# Patient Record
Sex: Female | Born: 1935 | Race: White | Hispanic: No | Marital: Married | Smoking: Former smoker
Health system: Southern US, Community
[De-identification: ages and names within clinical notes are randomized; demographics above are authoritative.]

## PROBLEM LIST (undated history)

## (undated) DIAGNOSIS — I340 Nonrheumatic mitral (valve) insufficiency: Secondary | ICD-10-CM

## (undated) DIAGNOSIS — N390 Urinary tract infection, site not specified: Secondary | ICD-10-CM

## (undated) DIAGNOSIS — Z9861 Coronary angioplasty status: Secondary | ICD-10-CM

## (undated) DIAGNOSIS — I700 Atherosclerosis of aorta: Secondary | ICD-10-CM

## (undated) DIAGNOSIS — I38 Endocarditis, valve unspecified: Secondary | ICD-10-CM

## (undated) DIAGNOSIS — K5792 Diverticulitis of intestine, part unspecified, without perforation or abscess without bleeding: Secondary | ICD-10-CM

## (undated) DIAGNOSIS — I480 Paroxysmal atrial fibrillation: Secondary | ICD-10-CM

## (undated) DIAGNOSIS — R001 Bradycardia, unspecified: Secondary | ICD-10-CM

## (undated) DIAGNOSIS — Q211 Atrial septal defect: Secondary | ICD-10-CM

## (undated) DIAGNOSIS — I10 Essential (primary) hypertension: Secondary | ICD-10-CM

## (undated) DIAGNOSIS — F419 Anxiety disorder, unspecified: Secondary | ICD-10-CM

## (undated) DIAGNOSIS — E785 Hyperlipidemia, unspecified: Secondary | ICD-10-CM

## (undated) DIAGNOSIS — Q2112 Patent foramen ovale: Secondary | ICD-10-CM

## (undated) DIAGNOSIS — R55 Syncope and collapse: Secondary | ICD-10-CM

## (undated) DIAGNOSIS — Z8719 Personal history of other diseases of the digestive system: Secondary | ICD-10-CM

## (undated) DIAGNOSIS — I2510 Atherosclerotic heart disease of native coronary artery without angina pectoris: Secondary | ICD-10-CM

## (undated) DIAGNOSIS — E039 Hypothyroidism, unspecified: Secondary | ICD-10-CM

## (undated) DIAGNOSIS — Z8542 Personal history of malignant neoplasm of other parts of uterus: Secondary | ICD-10-CM

## (undated) DIAGNOSIS — I495 Sick sinus syndrome: Secondary | ICD-10-CM

## (undated) DIAGNOSIS — H814 Vertigo of central origin: Secondary | ICD-10-CM

## (undated) DIAGNOSIS — I5032 Chronic diastolic (congestive) heart failure: Secondary | ICD-10-CM

## (undated) HISTORY — DX: Patent foramen ovale: Q21.12

## (undated) HISTORY — DX: Personal history of other diseases of the digestive system: Z87.19

## (undated) HISTORY — DX: Urinary tract infection, site not specified: N39.0

## (undated) HISTORY — DX: Atrial septal defect: Q21.1

## (undated) HISTORY — DX: Personal history of malignant neoplasm of other parts of uterus: Z85.42

## (undated) HISTORY — DX: Paroxysmal atrial fibrillation: I48.0

## (undated) HISTORY — PX: OTHER SURGICAL HISTORY: SHX169

## (undated) HISTORY — DX: Nonrheumatic mitral (valve) insufficiency: I34.0

## (undated) HISTORY — DX: Vertigo of central origin: H81.4

## (undated) HISTORY — DX: Anxiety disorder, unspecified: F41.9

## (undated) HISTORY — DX: Endocarditis, valve unspecified: I38

## (undated) HISTORY — DX: Coronary angioplasty status: Z98.61

## (undated) HISTORY — DX: Sick sinus syndrome: I49.5

## (undated) HISTORY — DX: Hyperlipidemia, unspecified: E78.5

## (undated) HISTORY — DX: Chronic diastolic (congestive) heart failure: I50.32

## (undated) HISTORY — DX: Bradycardia, unspecified: R00.1

## (undated) HISTORY — DX: Hypothyroidism, unspecified: E03.9

## (undated) HISTORY — PX: TONSILLECTOMY: SUR1361

## (undated) HISTORY — DX: Essential (primary) hypertension: I10

## (undated) HISTORY — DX: Syncope and collapse: R55

## (undated) HISTORY — DX: Atherosclerosis of aorta: I70.0

## (undated) HISTORY — DX: Atherosclerotic heart disease of native coronary artery without angina pectoris: I25.10

## (undated) HISTORY — DX: Diverticulitis of intestine, part unspecified, without perforation or abscess without bleeding: K57.92

---

## 1999-09-19 DIAGNOSIS — Z8542 Personal history of malignant neoplasm of other parts of uterus: Secondary | ICD-10-CM

## 1999-09-19 HISTORY — PX: ABDOMINAL HYSTERECTOMY: SHX81

## 1999-09-19 HISTORY — DX: Personal history of malignant neoplasm of other parts of uterus: Z85.42

## 2004-09-18 HISTORY — PX: COLONOSCOPY W/ BIOPSIES: SHX1374

## 2012-09-18 DIAGNOSIS — I480 Paroxysmal atrial fibrillation: Secondary | ICD-10-CM

## 2012-09-18 HISTORY — DX: Paroxysmal atrial fibrillation: I48.0

## 2013-07-24 HISTORY — PX: LOOP RECORDER INSERTION: SHX6722

## 2013-09-18 DIAGNOSIS — I5032 Chronic diastolic (congestive) heart failure: Secondary | ICD-10-CM | POA: Insufficient documentation

## 2013-09-18 DIAGNOSIS — I38 Endocarditis, valve unspecified: Secondary | ICD-10-CM | POA: Insufficient documentation

## 2013-09-18 HISTORY — DX: Chronic diastolic (congestive) heart failure: I50.32

## 2013-09-18 HISTORY — DX: Endocarditis, valve unspecified: I38

## 2014-08-18 HISTORY — PX: TRANSTHORACIC ECHOCARDIOGRAM: SHX275

## 2014-09-18 DIAGNOSIS — I700 Atherosclerosis of aorta: Secondary | ICD-10-CM

## 2014-09-18 DIAGNOSIS — I340 Nonrheumatic mitral (valve) insufficiency: Secondary | ICD-10-CM

## 2014-09-18 HISTORY — DX: Nonrheumatic mitral (valve) insufficiency: I34.0

## 2014-09-18 HISTORY — DX: Atherosclerosis of aorta: I70.0

## 2014-09-18 HISTORY — PX: TEE WITHOUT CARDIOVERSION: SHX5443

## 2014-09-30 DIAGNOSIS — I080 Rheumatic disorders of both mitral and aortic valves: Secondary | ICD-10-CM | POA: Insufficient documentation

## 2014-11-17 DIAGNOSIS — I2510 Atherosclerotic heart disease of native coronary artery without angina pectoris: Secondary | ICD-10-CM

## 2014-11-17 DIAGNOSIS — Z9861 Coronary angioplasty status: Secondary | ICD-10-CM

## 2014-11-17 HISTORY — PX: CORONARY ANGIOPLASTY WITH STENT PLACEMENT: SHX49

## 2014-11-17 HISTORY — DX: Atherosclerotic heart disease of native coronary artery without angina pectoris: I25.10

## 2014-11-17 HISTORY — PX: OTHER SURGICAL HISTORY: SHX169

## 2014-11-17 HISTORY — DX: Coronary angioplasty status: Z98.61

## 2014-12-02 DIAGNOSIS — I2510 Atherosclerotic heart disease of native coronary artery without angina pectoris: Secondary | ICD-10-CM | POA: Insufficient documentation

## 2014-12-03 DIAGNOSIS — Z8719 Personal history of other diseases of the digestive system: Secondary | ICD-10-CM

## 2014-12-03 HISTORY — DX: Personal history of other diseases of the digestive system: Z87.19

## 2015-05-07 LAB — PROTIME-INR: INR: 0.9 (ref 0.9–1.1)

## 2015-12-17 DIAGNOSIS — E785 Hyperlipidemia, unspecified: Secondary | ICD-10-CM | POA: Diagnosis not present

## 2015-12-17 DIAGNOSIS — E876 Hypokalemia: Secondary | ICD-10-CM | POA: Diagnosis not present

## 2015-12-17 DIAGNOSIS — G3184 Mild cognitive impairment, so stated: Secondary | ICD-10-CM | POA: Diagnosis not present

## 2015-12-17 DIAGNOSIS — K219 Gastro-esophageal reflux disease without esophagitis: Secondary | ICD-10-CM | POA: Diagnosis not present

## 2015-12-17 DIAGNOSIS — Z681 Body mass index (BMI) 19 or less, adult: Secondary | ICD-10-CM | POA: Diagnosis not present

## 2015-12-17 DIAGNOSIS — D509 Iron deficiency anemia, unspecified: Secondary | ICD-10-CM | POA: Diagnosis not present

## 2015-12-17 DIAGNOSIS — I509 Heart failure, unspecified: Secondary | ICD-10-CM | POA: Diagnosis not present

## 2015-12-17 DIAGNOSIS — I4891 Unspecified atrial fibrillation: Secondary | ICD-10-CM | POA: Diagnosis not present

## 2015-12-17 DIAGNOSIS — I110 Hypertensive heart disease with heart failure: Secondary | ICD-10-CM | POA: Diagnosis not present

## 2015-12-17 DIAGNOSIS — E039 Hypothyroidism, unspecified: Secondary | ICD-10-CM | POA: Diagnosis not present

## 2015-12-22 DIAGNOSIS — K625 Hemorrhage of anus and rectum: Secondary | ICD-10-CM | POA: Diagnosis not present

## 2016-01-19 DIAGNOSIS — Q211 Atrial septal defect: Secondary | ICD-10-CM | POA: Diagnosis not present

## 2016-01-19 DIAGNOSIS — I480 Paroxysmal atrial fibrillation: Secondary | ICD-10-CM | POA: Diagnosis not present

## 2016-01-19 DIAGNOSIS — I2510 Atherosclerotic heart disease of native coronary artery without angina pectoris: Secondary | ICD-10-CM | POA: Diagnosis not present

## 2016-01-19 DIAGNOSIS — E785 Hyperlipidemia, unspecified: Secondary | ICD-10-CM | POA: Diagnosis not present

## 2016-01-19 DIAGNOSIS — I340 Nonrheumatic mitral (valve) insufficiency: Secondary | ICD-10-CM | POA: Diagnosis not present

## 2016-01-19 DIAGNOSIS — I10 Essential (primary) hypertension: Secondary | ICD-10-CM | POA: Diagnosis not present

## 2016-01-19 DIAGNOSIS — I495 Sick sinus syndrome: Secondary | ICD-10-CM | POA: Diagnosis not present

## 2016-01-19 DIAGNOSIS — I509 Heart failure, unspecified: Secondary | ICD-10-CM | POA: Diagnosis not present

## 2016-01-25 DIAGNOSIS — I495 Sick sinus syndrome: Secondary | ICD-10-CM | POA: Diagnosis not present

## 2016-05-02 DIAGNOSIS — S2020XA Contusion of thorax, unspecified, initial encounter: Secondary | ICD-10-CM | POA: Diagnosis not present

## 2016-05-19 DIAGNOSIS — I495 Sick sinus syndrome: Secondary | ICD-10-CM | POA: Diagnosis not present

## 2016-05-23 DIAGNOSIS — E039 Hypothyroidism, unspecified: Secondary | ICD-10-CM | POA: Diagnosis not present

## 2016-05-23 DIAGNOSIS — I10 Essential (primary) hypertension: Secondary | ICD-10-CM | POA: Diagnosis not present

## 2016-05-23 DIAGNOSIS — E782 Mixed hyperlipidemia: Secondary | ICD-10-CM | POA: Diagnosis not present

## 2016-05-23 DIAGNOSIS — E031 Congenital hypothyroidism without goiter: Secondary | ICD-10-CM | POA: Diagnosis not present

## 2016-05-23 DIAGNOSIS — E7801 Familial hypercholesterolemia: Secondary | ICD-10-CM | POA: Diagnosis not present

## 2016-05-23 DIAGNOSIS — E038 Other specified hypothyroidism: Secondary | ICD-10-CM | POA: Diagnosis not present

## 2016-05-23 DIAGNOSIS — E785 Hyperlipidemia, unspecified: Secondary | ICD-10-CM | POA: Diagnosis not present

## 2016-05-23 DIAGNOSIS — Z23 Encounter for immunization: Secondary | ICD-10-CM | POA: Diagnosis not present

## 2016-06-23 DIAGNOSIS — H6123 Impacted cerumen, bilateral: Secondary | ICD-10-CM | POA: Diagnosis not present

## 2016-06-23 DIAGNOSIS — R42 Dizziness and giddiness: Secondary | ICD-10-CM | POA: Diagnosis not present

## 2016-07-13 ENCOUNTER — Ambulatory Visit (INDEPENDENT_AMBULATORY_CARE_PROVIDER_SITE_OTHER): Payer: Medicare HMO | Admitting: Adult Health

## 2016-07-13 ENCOUNTER — Encounter: Payer: Self-pay | Admitting: Adult Health

## 2016-07-13 VITALS — BP 118/70 | Temp 98.6°F | Wt 114.0 lb

## 2016-07-13 DIAGNOSIS — E039 Hypothyroidism, unspecified: Secondary | ICD-10-CM | POA: Diagnosis not present

## 2016-07-13 DIAGNOSIS — I482 Chronic atrial fibrillation, unspecified: Secondary | ICD-10-CM

## 2016-07-13 DIAGNOSIS — I10 Essential (primary) hypertension: Secondary | ICD-10-CM | POA: Insufficient documentation

## 2016-07-13 DIAGNOSIS — Z7689 Persons encountering health services in other specified circumstances: Secondary | ICD-10-CM

## 2016-07-13 DIAGNOSIS — I4891 Unspecified atrial fibrillation: Secondary | ICD-10-CM | POA: Insufficient documentation

## 2016-07-13 NOTE — Progress Notes (Signed)
Patient presents to clinic today to establish care. She is a pleasant 82 year old female who  has a past medical history of Atrial fibrillation (HCC); Bradycardia; CAD (coronary artery disease); CHF (congestive heart failure) (HCC); Diverticulitis; Heart disease; Hyperlipidemia; Hypertension; Hypothyroidism; Sick sinus syndrome (HCC); Syncope; and Urinary tract infection.  [place] be closer to their son and grandson after their daughters untimely death from cancer. They moved her about a month ago   Acute Concerns: Establish Care   Chronic Issues: Hypertension  - feels as though this is well controlled   Hypothyroidism  - Feels as though this is well controlled on her current medication regimen   Cardiac - She has a significant PMH of cardiac history including A fib, sick sinus syndrome, CAD, and severe mitral insufficiency and is has an implanted cardiac monitor. She is currently taking Coumadin. She saw cardiology in [place] and would like to establish here in [place].   Health Maintenance: Dental --Routine Care  Vision -- 2015  Immunizations -- UTD  Colonoscopy -- No longer needs. Last was in 2012  Mammogram -- No longer needs PAP -- No longer needs Bone Density --   Is followed  Cardiologist - in [place]    Past Medical History:  Diagnosis Date  . Atrial fibrillation (HCC)   . Bradycardia   . CAD (coronary artery disease)   . CHF (congestive heart failure) (HCC)   . Diverticulitis   . Heart disease   . Hyperlipidemia   . Hypertension   . Hypothyroidism   . Sick sinus syndrome (HCC)   . Syncope   . Urinary tract infection     Past Surgical History:  Procedure Laterality Date  . ABDOMINAL HYSTERECTOMY  2001  . CORONARY ANGIOPLASTY WITH STENT PLACEMENT    . implanted cardiac monitor     . TONSILLECTOMY      No current outpatient prescriptions on file prior to visit.   No current facility-administered medications on file prior  to visit.     No Known Allergies  Family History  Problem Relation Age of Onset  . Stomach cancer Father   . Cancer Brother     Social History   Social History  . Marital status: Married    Spouse name: N/A  . Number of children: N/A  . Years of education: N/A   Occupational History  . Not on file.   Social History Main Topics  . Smoking status: Never Smoker  . Smokeless tobacco: Never Used  . Alcohol use Yes     Comment: "wine occasionally"  . Drug use: No  . Sexual activity: Not on file   Other Topics Concern  . Not on file   Social History Narrative   Married for 59 years    Has a son, daughter died of cancer   One grandson    [place] Lives there from May- October          Review of Systems  Constitutional: Negative.   HENT: Negative.   Eyes: Negative.   Respiratory: Negative.   Cardiovascular: Negative.   Gastrointestinal: Negative.   Genitourinary: Negative.   Musculoskeletal: Negative.   Skin: Negative.   Neurological: Negative.   Endo/Heme/Allergies: Negative.   All other systems reviewed and are negative.   BP 118/70   Temp 98.6 F (37 C) (Oral)   Wt 114 lb (51.7 kg)   Physical Exam  Constitutional: She is  oriented to person, place, and time and well-developed, well-nourished, and in no distress. No distress.  HENT:  Head: Normocephalic and atraumatic.  Right Ear: External ear normal.  Left Ear: External ear normal.  Nose: Nose normal.  Mouth/Throat: Oropharynx is clear and moist. No oropharyngeal exudate.  Eyes: Conjunctivae and EOM are normal. Pupils are equal, round, and reactive to light. Right eye exhibits no discharge. Left eye exhibits no discharge. No scleral icterus.  Cardiovascular: Normal rate, regular rhythm, normal heart sounds and intact distal pulses.  Exam reveals no gallop and no friction rub.   No murmur heard. Pulmonary/Chest: Effort normal and breath sounds normal. No respiratory distress. She has no  wheezes. She has no rales. She exhibits no tenderness.  Abdominal: There is no rebound.  Musculoskeletal: Normal range of motion. She exhibits no edema, tenderness or deformity.  Slow steady gait   Neurological: She is alert and oriented to person, place, and time. Gait normal. GCS score is 15.  Skin: Skin is warm and dry. No rash noted. She is not diaphoretic. No erythema. No pallor.  Psychiatric: Memory, affect and judgment normal.  Nursing note and vitals reviewed.  Assessment/Plan: 1. Encounter to establish care - Ambulatory referral to Cardiology - Will request history from previous provider and have her follow up for CPE  - Continue to stay active and eat healthy   2. Essential hypertension - Appears to be well controlled on current medication   3. Hypothyroidism, unspecified type - Will request history from previous provider  4. Chronic atrial fibrillation (HCC) - Regular rate and rhythm in the office today  - Ambulatory referral to Cardiology   Shirline Frees, NP

## 2016-07-13 NOTE — Patient Instructions (Signed)
It was great meeting you today! Welcome to [place].   I will request your records from Dr.Rocker.   Someone from Cardiology will call you to schedule your appointment with them.   I will follow up with you regarding your physical.   Please let me know if you need anything

## 2016-07-20 ENCOUNTER — Telehealth: Payer: Self-pay | Admitting: Cardiology

## 2016-07-20 NOTE — Telephone Encounter (Signed)
[facility] for appointment with  Dr [name] on 07/31/16.  [facility] for Dr Elissa HeftyHarding's schedule on 07/31/16. lp

## 2016-07-25 ENCOUNTER — Encounter: Payer: Self-pay | Admitting: Cardiology

## 2016-07-26 ENCOUNTER — Other Ambulatory Visit: Payer: Self-pay | Admitting: Adult Health

## 2016-07-26 DIAGNOSIS — H8110 Benign paroxysmal vertigo, unspecified ear: Secondary | ICD-10-CM

## 2016-07-31 ENCOUNTER — Ambulatory Visit (INDEPENDENT_AMBULATORY_CARE_PROVIDER_SITE_OTHER): Payer: Medicare HMO | Admitting: Cardiology

## 2016-07-31 ENCOUNTER — Encounter: Payer: Self-pay | Admitting: Cardiology

## 2016-07-31 VITALS — BP 100/70 | HR 71 | Ht 60.0 in | Wt 116.6 lb

## 2016-07-31 DIAGNOSIS — E039 Hypothyroidism, unspecified: Secondary | ICD-10-CM

## 2016-07-31 DIAGNOSIS — I5032 Chronic diastolic (congestive) heart failure: Secondary | ICD-10-CM | POA: Diagnosis not present

## 2016-07-31 DIAGNOSIS — I050 Rheumatic mitral stenosis: Secondary | ICD-10-CM | POA: Diagnosis not present

## 2016-07-31 DIAGNOSIS — I38 Endocarditis, valve unspecified: Secondary | ICD-10-CM

## 2016-07-31 DIAGNOSIS — I2510 Atherosclerotic heart disease of native coronary artery without angina pectoris: Secondary | ICD-10-CM

## 2016-07-31 DIAGNOSIS — I10 Essential (primary) hypertension: Secondary | ICD-10-CM

## 2016-07-31 DIAGNOSIS — Z9861 Coronary angioplasty status: Secondary | ICD-10-CM

## 2016-07-31 DIAGNOSIS — I495 Sick sinus syndrome: Secondary | ICD-10-CM

## 2016-07-31 DIAGNOSIS — R55 Syncope and collapse: Secondary | ICD-10-CM

## 2016-07-31 DIAGNOSIS — I480 Paroxysmal atrial fibrillation: Secondary | ICD-10-CM | POA: Diagnosis not present

## 2016-07-31 DIAGNOSIS — E785 Hyperlipidemia, unspecified: Secondary | ICD-10-CM

## 2016-07-31 DIAGNOSIS — I700 Atherosclerosis of aorta: Secondary | ICD-10-CM

## 2016-07-31 DIAGNOSIS — D649 Anemia, unspecified: Secondary | ICD-10-CM | POA: Insufficient documentation

## 2016-07-31 NOTE — Patient Instructions (Addendum)
SCHEDULE AT [address] Your physician has requested that you have an echocardiogram. Echocardiography is a painless test that uses sound waves to create images of your heart. It provides your doctor with information about the size and shape of your heart and how well your heart's chambers and valves are working. This procedure takes approximately one hour. There are no restrictions for this procedure.   WILL HAVE INFORMATION OF LOOP RECORDER TRANSFERRED TO [facility]  LABS-- FASTING - --LIPIDS, CMP , TSH , CBC ,  Your physician recommends that you schedule a follow-up appointment in: 6-8 WEEKS  WITH DR HARDING.

## 2016-07-31 NOTE — Progress Notes (Signed)
PCP: Shirline Frees, NP  Former Cardiologist: Lanae Boast - [facility] / [place] [phone]); fax number: [phone])  address: [address] [place], [place] [address] Former PCP: Dr. Hazeline Junker [facility]  Clinic Note: Chief Complaint  Patient presents with  . Establish Care    Needs local Cardiologist  . Mitral Regurgitation    - Severe by TEE  . Coronary Artery Disease    s/pPCI to RCA  . Atrial Fibrillation    with Sick Sinus Syndrome - s/p Medtronic Linq Loop Recorder; not anticoagulated 2/2 GI bleed on triple Rx - on ASA  . Dizziness    Partly related to vertigo, also potentially hypotension and bradycardia    HPI: Deborah [name] is a 82 y.o. female with a PMH notable for A. fib and bradycardia/sick sinus syndrome as well as CAD-PCI RCA, severe MR, diastolic CHF and PFO/Atrial Septal Defect. who presents today for establishing cardiology care. She also has hypertension, hyperlipidemia and hypothyroidism. She apparently has a loop recorder in place as part of her evaluation for sick sinus syndrome.  Deborah [name] was last seen on 05/19/2016 by Unice Bailey, PA-C (under the supervision of Dr. Norval Gable) -- this was for a loop recorder interrogation. Demonstrated one episode of A. fib but to pauses. One was during sleeping hours (0 3:20 AM) lasting 3 seconds, the second was 4.4 seconds at 9:56 PM. There was no sense of any symptoms as a result. It is unclear if she actually had a fall or any dizziness with this episode. Her last visit with her cardiologist was in May of this year. He plan to continue current medication. The intention was for her to follow-up here [facility]  Studies Reviewed: 2 echocardiogram reports, TEE and cardiac cath report as well as loop recorder interrogation are reviewed (noted in [facility] as well as above)  Interval History: Deborah [name] presents here today for establishing cardiology care and is  actually doing quite well. She really denies any significant symptoms since she is moved down here. She has been quite active with her recent move. She has not had any significant near syncopal events or dizziness spells since arriving here that she can think of. She is only taking one half pill of furosemide a day As far as her vertigo is been quite stable, but she still has some dizzy spells. She has to sleep on 2 pillows to help her vertigo more than any orthopnea or PND. No edema with minimal dose of Lasix. She sleeps on 2 pillows more because of vertigo and dyspnea.  She indicates that after her last visit with her cardiologist in [place], she was noted that had 2 episodes of roughly 4 second pauses. Since he is sleeping, and the other one was not. It does not appear to she has had anything significant since, but has not had a evaluation in a while. She does not have indicated having had an incident and a long while. No syncope or near-syncope symptoms since her last cardiology visit. He also denies any rapid irregular heartbeats or any sensation of irregularity to her heart rhythm to suggest recurrence of A. fib.  She really denies any resting or exertional chest tightness or pressure. She does get little bit short of breath when she exerts herself cup, but she does acknowledge being somewhat deconditioned as well.  According to her prior cardiologist, she is noted continued vague dizziness but no overt palpitations or syncopal episodes. It did not  appear that any of her short pauses correlated with episodes of dizziness. The most recent episode however may have. She has had a history of recurrent syncope or near-syncope for close to 20 years now. Most recent episode however was about 2015. This sounded like a vasovagal episode. She has had several episodes of 5 second pauses that as yet have not been deemed symptomatic.  No TIA/amaurosis fugax symptoms. No melena, hematochezia, hematuria, or  epistaxis- she is only on aspirin currently; no further GI Bleed No claudication.  No flowsheet data found.  ROS: A comprehensive was performed. Review of Systems  Constitutional: Negative for chills, fever, malaise/fatigue and weight loss.  HENT: Negative for congestion, nosebleeds and sinus pain.   Eyes: Negative.   Respiratory: Negative for cough, shortness of breath and wheezing.   Cardiovascular:       Per history of present illness  Gastrointestinal: Negative for blood in stool, constipation, heartburn and melena.  Genitourinary: Negative for frequency and hematuria.  Musculoskeletal: Positive for back pain and joint pain. Negative for falls.  Skin: Negative.   Neurological: Positive for dizziness (Vertigo) and headaches. Negative for tingling, seizures and loss of consciousness.  Psychiatric/Behavioral: Negative for depression and memory loss. The patient is not nervous/anxious and does not have insomnia.   All other systems reviewed and are negative.  All history including PMH, PSH, social and family history entered by Dr. Herbie Baltimore Past Medical History:  Diagnosis Date  . Anxiety   . Bradycardia   . CAD S/P percutaneous coronary angioplasty 11/2014   DES PCI to RCA. Cath was done for pre-operative evaluation for mitral repair; LM 30%, LAD 50%, RCA 90% (Promus DES)  . 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 Normal EF by echo.  . Diverticulitis   . Essential hypertension   . H/O: upper GI bleed 12/03/2014   While on ASA/Plavix & Warfarin --> Hct dropped to 18%; small AVM noted but no overt pathology.  [name] History of uterine cancer 2001   Status post hysterectomy  . Hyperlipidemia with target LDL less than 70    Coronary disease and thoracic aortic atheroma  . Hypothyroidism    On Levothyroxine  . Paroxysmal atrial fibrillation (HCC) 2014   Associated with sick sinus syndrome. -- Intolerant of beta  blockers and diltiazem. Rate control with digoxin. Not on anticoagulation despite CHA2DS2Vasc 6  2/2 prior severe GI bleed on triple therapy  . PFO (patent foramen ovale)    Noted on TEE - L-R shunt (elsewhere reported it as a ASD)  . Severe mitral regurgitation by prior echocardiogram 09/2014   Seen on TEE to have severe MR with multiple jets. Vena contracted 0.8; Consideration had been ? Mitral E-Clip - put on hold after GI Bleed.  . Sick sinus syndrome (HCC)    Status post Loop Recorder: Medtronic Linq -- most recent interrogation 05/19/2016: 2 new pauses. One greater than 3 seconds at 0 320 the morning. Second was 4.4 seconds at 9:56 PM; also noted to have an episode of A. fib. --> Recommended further follow-up upon arrival to [facility].  . Thoracic aortic atherosclerosis (HCC) 09/2014   Moderate grade 3-4 atheroma noted on TEE  . Urinary tract infection   . Vasovagal syncope    Exacerbated by bradycardia, sick sinus syndrome, and mitral regurgitation.  . Vertigo, central, unspecified laterality    Chronic dizziness.    Past Surgical History:  Procedure Laterality Date  .  ABDOMINAL HYSTERECTOMY  2001  . Cataract Surgery  Bilateral   . COLONOSCOPY W/ BIOPSIES  2006   2 polyps noted along with diverticulitis  . CORONARY ANGIOPLASTY WITH STENT PLACEMENT  11/2014   [facility]: 90% RCA - PCI with Promus DES.  [name] implanted cardiac monitor     . LOOP RECORDER INSERTION  07/24/2013   Medtronic Linq - to evaluate SSS for symptomatic bradycardia  . Right and Left Heart Catheterization  11/2014   RHC: RAP 4, RVP 32/4, PA peak 29/7/16. LVEDP 18.  CORS: 30% LM, 50% LAD, 90% RCA --> PCI (Promus DES)  . TEE WITHOUT CARDIOVERSION  09/2014   Normal LV function. Severe MR with multiple jets. Vena contractile 0.8.  PFO with L-R shunt; moderate grade 3-4 aortic atheroma  . TONSILLECTOMY    . TRANSTHORACIC ECHOCARDIOGRAM  07/2013   Severe left atrial  enlargement (volume 57 mL). Normal EF. Moderate TR. ASD. Moderate MR. No pulmonary hypertension.  . TRANSTHORACIC ECHOCARDIOGRAM  08/2014   Normal LV function with biatrial enlargement. Moderate-severe mitral and tricuspid insufficiency.    Medications   Medication Sig Start Date End Date Taking? Authorizing Provider  aspirin EC 81 MG tablet Take 81 mg by mouth daily.   Yes Historical Provider, MD  calcium carbonate (TUMS - DOSED IN MG ELEMENTAL CALCIUM) 500 MG chewable tablet Chew 1 tablet by mouth 2 (two) times daily.   Yes Historical Provider, MD  digoxin (LANOXIN) 0.125 MG tablet Take 0.125 mg by mouth daily.    Yes Historical Provider, MD  ferrous sulfate 325 (65 FE) MG tablet Take 325 mg by mouth 2 (two) times daily with a meal.    Yes Historical Provider, MD  fluticasone (FLONASE) 50 MCG/ACT nasal spray Place 2 sprays into both nostrils daily.   Yes Historical Provider, MD  furosemide (LASIX) 20 MG tablet Take 20 mg by mouth. One tablet twice a day every other day   Yes Historical Provider, MD  levothyroxine (SYNTHROID, LEVOTHROID) 75 MCG tablet Take 75 mcg by mouth daily before breakfast.   Yes Historical Provider, MD  losartan (COZAAR) 25 MG tablet Take 25 mg by mouth daily.   Yes Historical Provider, MD  meclizine (ANTIVERT) 25 MG tablet Take 25 mg by mouth 3 (three) times daily as needed for dizziness.   Yes Historical Provider, MD  omeprazole (PRILOSEC) 20 MG capsule Take 20 mg by mouth daily.   Yes Historical Provider, MD  Polyethylene Glycol 3350 (MIRALAX PO) Patient takes 1/2 dose by mouth daily   Yes Historical Provider, MD  potassium chloride (K-DUR,KLOR-CON) 10 MEQ tablet Take 10 mEq by mouth 2 (two) times daily.   Yes Historical Provider, MD  simvastatin (ZOCOR) 40 MG tablet Take 40 mg by mouth daily.   Yes Historical Provider, MD   Allergies  Allergen Reactions  . Carvedilol Other (See Comments)    Bradycardia   . Diltiazem Other (See Comments)    Bradycardia  .  Lopressor [Metoprolol] Other (See Comments)    Bradycardia    Social History   Social History  . Marital status: Married    Spouse name: N/A  . Number of children: 2  . Years of education: N/A   Occupational History  .      Retired Passenger transport manager   Social History Main Topics  . Smoking status: Former Smoker    Packs/day: 0.50    Years: 5.00    Types: Cigarettes    Quit date: 08/02/1970  .  Smokeless tobacco: Never Used  . Alcohol use Yes     Comment: "wine occasionally"  . Drug use: No  . Sexual activity: Not Asked   Other Topics Concern  . None   Social History Narrative   Married for 59 years    Has a son, daughter died of breast cancer   One grandson    [place] Lives there from [place]- October) to be closer to their son & only remaining family.   Family History  Problem Relation Age of Onset  . Stomach cancer Father   . Cancer Brother   . Diabetes Brother   . Cancer Daughter     Wt Readings from Last 3 Encounters:  07/31/16 52.9 kg (116 lb 9.6 oz)  07/13/16 51.7 kg (114 lb)    PHYSICAL EXAM BP 100/70   Pulse 71   Ht 5' (1.524 m)   Wt 52.9 kg (116 lb 9.6 oz)   LMP  (LMP Unknown)   BMI 22.77 kg/m  General appearance: alert, cooperative, appears stated age, no distress; Well-nourished and well-groomed. HEENT: [place]/AT, EOMI, MMM, anicteric sclera Neck: no adenopathy, no carotid bruit and no JVD Lungs: clear to auscultation bilaterally, normal percussion bilaterally and non-labored Heart: RRR, non-displaced PMI. Normal S1&S2. No R/G. 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 Pulses: 2+ and symmetric;  Skin: temperature normal, vascularity normal and thin skin c/w age with decreased turgor or telangiectasias - lower leg(s) bilateral, ankle(s) bilateral  and varicosities - lower leg(s) bilateral, ankle(s) bilateral Neurologic: Mental status: Alert, oriented, thought content appropriate; Pleasant mood & affect. steady gail Cranial nerves: normal (II-XII grossly intact)    Adult ECG Report  Rate: 71 ;  Rhythm: normal sinus rhythm and Left axis deviation (-35); Otherwise normal durations, intervals and voltage  Narrative Interpretation: Relatively normal EKG  EKG from January 2016 noted normal sinus rhythm with a rate 66 BPM. Rare PVCs. Intermittent Mobitz type I second-degree AV block.   Other studies Reviewed: Additional studies/ records that were reviewed today include:  Recent Labs:  None currently available    ASSESSMENT / PLAN: Problem List Items Addressed This Visit    Coronary artery disease involving native heart without angina pectoris (Chronic)    In addition to her RCA stent, she does have residual disease of moderate severity in the LAD with some left main disease as well. She is on aspirin and statin along with ARB. Thankfully, no anginal symptoms.      Relevant Orders   EKG 12-Lead (Completed)   ECHOCARDIOGRAM COMPLETE   CBC   Comprehensive metabolic panel   Lipid panel   TSH   Severe mitral valve stenosis - Primary (Chronic)    Very soft murmur on exam. She has not had an echo since her PCI. It is possible that some of this MR could've been ischemic and improved post PCI. She doesn't really have active heart failure symptoms currently.  Plan: Check 2-D echo to reassess. Would need to determine the appropriate course of action based on the results of this echo. With presence of PFO/ASD, I don't know if a mitral clip would be the right way to treat this. I think we would at least want to obtain a surgical consult once we see the results.      Relevant Orders  EKG 12-Lead (Completed)   ECHOCARDIOGRAM COMPLETE   CBC   Comprehensive metabolic panel   TSH   SSS (sick sinus syndrome) (HCC) (Chronic)    Loop recorder  in place. She clearly has pauses. He did download from more recent data. Unfortunately were nearing the 3 year life expectancy of this recorder. The question still remains are these dizzy episodes associated with her bradycardia. Is a asymptomatic 5 second pause worth considering pacemaker. Depending on what this last report shows, would consider referral to EP just for consideration.  As of now she is only on digoxin, mostly because she does have bursts of A. fib.      Relevant Orders   EKG 12-Lead (Completed)   ECHOCARDIOGRAM COMPLETE   CBC   Comprehensive metabolic panel   TSH   CAD S/P DES PCI to RCA (Chronic)    Just over 18 months out from her DES stent to the RCA. No longer on antiplatelet agent other than aspirin. In light of her history of GI bleed on triple therapy, would try to hold off on finally. Treatment. For now continue aspirin, statin and ARB. No beta blocker secondary to sick sinus syndrome.      Relevant Orders   EKG 12-Lead (Completed)   ECHOCARDIOGRAM COMPLETE   CBC   Comprehensive metabolic panel   Paroxysmal atrial fibrillation (HCC): CHA2DS2Vasc = 6; Not currently on AC. (Chronic)    Only on digoxin for rate control due to sick sinus syndrome. Intolerant of any beta blocker or diltiazem. Deborah [name] interrogation of her loop recorder does show that she has had A. fib, I just can't tell how fast. Cannot tell she has tachybradycardia or just sick sinus.  Pending interrogation, would consider referral to EP to determine appropriate course of action.  As of now, I will hold off on endocrine relation, but I think that Eliquis would probably be a good option.      Relevant Orders   EKG 12-Lead (Completed)   ECHOCARDIOGRAM COMPLETE   CBC   Comprehensive metabolic panel   Lipid panel   TSH   Essential hypertension (Chronic)    Borderline hypotensive today. I think some of it is this may be related to that. We may need to simply back off on losartan if her blood  pressures continue to be low.      Relevant Orders   EKG 12-Lead (Completed)   ECHOCARDIOGRAM COMPLETE   CBC   Comprehensive metabolic panel   Lipid panel   TSH   Hypothyroidism (Chronic)    Has not been checked since she is moved down. We will check TSH along with lipid      Relevant Orders   EKG 12-Lead (Completed)   ECHOCARDIOGRAM COMPLETE   CBC   Comprehensive metabolic panel   Lipid panel   TSH   Anemia (Chronic)    History of GI bleed, but no further episodes of bleeding. Prior to considering whether or not we start full affect regulation, we need to reassess hemoglobin level. Check CBC with upcoming lab draw.      Relevant Orders   EKG 12-Lead (Completed)   ECHOCARDIOGRAM COMPLETE   CBC   Comprehensive metabolic panel   TSH   Chronic diastolic heart failure due to valvular disease (HCC) (Chronic)    He seems euvolemic today. Minimal use of furosemide. Essentially every other day 10 mg. Only able tolerate very low dose of losartan.  Most of her heart failure revolves around mitral regurgitation. This needs  to be reassessed. Plan: Continue current treatment and check 2-D echo.      Vasovagal syncope (Chronic)    No recurrent syncope since 2015. Just chronic dizziness with possible vertigo.  Need to ensure adequate hydration.      Thoracic aortic atherosclerosis (HCC) (Chronic)    Risk factor modification with statin, aspirin and blood pressure control with ARB. -- Limited by hypotension.      Hyperlipidemia with target LDL less than 70 (Chronic)    On statin. I don't have recent labs. We will go ahead and check fasting lipid panel along with CMP and TSH.         Current medicines are reviewed at length with the patient today. (+/- concerns) none The following changes have been made: No changes  Patient Instructions  SCHEDULE AT [address] Your physician has requested that you have an echocardiogram. Echocardiography is a  painless test that uses sound waves to create images of your heart. It provides your doctor with information about the size and shape of your heart and how well your heart's chambers and valves are working. This procedure takes approximately one hour. There are no restrictions for this procedure.   WILL HAVE INFORMATION OF LOOP RECORDER TRANSFERRED TO [facility]  LABS-- FASTING - --LIPIDS, CMP , TSH , CBC ,  Your physician recommends that you schedule a follow-up appointment in: 6-8 WEEKS  WITH DR HARDING.   Studies Ordered:   Orders Placed This Encounter  Procedures  . CBC  . Comprehensive metabolic panel  . Lipid panel  . TSH  . EKG 12-Lead  . ECHOCARDIOGRAM COMPLETE      Bryan Lemma, M.D., M.S. Interventional Cardiologist   Pager # [phone] Phone # [phone] [address] [place], [place] [address]

## 2016-08-01 ENCOUNTER — Encounter: Payer: Self-pay | Admitting: Cardiology

## 2016-08-02 DIAGNOSIS — R55 Syncope and collapse: Secondary | ICD-10-CM | POA: Insufficient documentation

## 2016-08-02 DIAGNOSIS — E785 Hyperlipidemia, unspecified: Secondary | ICD-10-CM | POA: Insufficient documentation

## 2016-08-02 NOTE — Assessment & Plan Note (Signed)
Only on digoxin for rate control due to sick sinus syndrome. Intolerant of any beta blocker or diltiazem. Deborah [name] interrogation of her loop recorder does show that she has had A. fib, I just can't tell how fast. Cannot tell she has tachybradycardia or just sick sinus.  Pending interrogation, would consider referral to EP to determine appropriate course of action.  As of now, I will hold off on endocrine relation, but I think that Eliquis would probably be a good option.

## 2016-08-02 NOTE — Assessment & Plan Note (Signed)
He seems euvolemic today. Minimal use of furosemide. Essentially every other day 10 mg. Only able tolerate very low dose of losartan.  Most of her heart failure revolves around mitral regurgitation. This needs to be reassessed. Plan: Continue current treatment and check 2-D echo.

## 2016-08-02 NOTE — Assessment & Plan Note (Signed)
Has not been checked since she is moved down. We will check TSH along with lipid

## 2016-08-02 NOTE — Assessment & Plan Note (Signed)
History of GI bleed, but no further episodes of bleeding. Prior to considering whether or not we start full affect regulation, we need to reassess hemoglobin level. Check CBC with upcoming lab draw.

## 2016-08-02 NOTE — Assessment & Plan Note (Signed)
Risk factor modification with statin, aspirin and blood pressure control with ARB. -- Limited by hypotension.

## 2016-08-02 NOTE — Assessment & Plan Note (Signed)
Borderline hypotensive today. I think some of it is this may be related to that. We may need to simply back off on losartan if her blood pressures continue to be low.

## 2016-08-02 NOTE — Assessment & Plan Note (Signed)
Just over 18 months out from her DES stent to the RCA. No longer on antiplatelet agent other than aspirin. In light of her history of GI bleed on triple therapy, would try to hold off on finally. Treatment. For now continue aspirin, statin and ARB. No beta blocker secondary to sick sinus syndrome.

## 2016-08-02 NOTE — Assessment & Plan Note (Signed)
No recurrent syncope since 2015. Just chronic dizziness with possible vertigo.  Need to ensure adequate hydration.

## 2016-08-02 NOTE — Assessment & Plan Note (Signed)
On statin. I don't have recent labs. We will go ahead and check fasting lipid panel along with CMP and TSH.

## 2016-08-02 NOTE — Assessment & Plan Note (Signed)
Loop recorder in place. She clearly has pauses. He did download from more recent data. Unfortunately were nearing the 3 year life expectancy of this recorder. The question still remains are these dizzy episodes associated with her bradycardia. Is a asymptomatic 5 second pause worth considering pacemaker. Depending on what this last report shows, would consider referral to EP just for consideration.  As of now she is only on digoxin, mostly because she does have bursts of A. fib.

## 2016-08-02 NOTE — Assessment & Plan Note (Signed)
Very soft murmur on exam. She has not had an echo since her PCI. It is possible that some of this MR could've been ischemic and improved post PCI. She doesn't really have active heart failure symptoms currently.  Plan: Check 2-D echo to reassess. Would need to determine the appropriate course of action based on the results of this echo. With presence of PFO/ASD, I don't know if a mitral clip would be the right way to treat this. I think we would at least want to obtain a surgical consult once we see the results.

## 2016-08-02 NOTE — Assessment & Plan Note (Signed)
In addition to her RCA stent, she does have residual disease of moderate severity in the LAD with some left main disease as well. She is on aspirin and statin along with ARB. Thankfully, no anginal symptoms.

## 2016-08-15 ENCOUNTER — Telehealth: Payer: Self-pay | Admitting: *Deleted

## 2016-08-15 NOTE — Telephone Encounter (Signed)
Husband returning call.  Please call in the morning.

## 2016-08-15 NOTE — Telephone Encounter (Signed)
[facility]  Gave [facility] phone number.  Will request that patient send a manual [employer] transmission for review by Dr. [name].

## 2016-08-16 NOTE — Telephone Encounter (Signed)
Spoke with patient.  Walked her through manual transmission process.  Transmission received, only new episode is "AF episode" noted on 08/03/16.  Episode ECG suggests SR w/PACs and artifact, but will review this episode along with tachy and "AF" episodes from 10/14 and 10/2 with Dr. Ladona Ridgelaylor to confirm.  Most recent pause episode was on 05/19/16 at 0321.  Patient's husband called back asking if we "got everything we needed".  Advised husband that we received transmission and that patient does not need Device [facility] appointment (on 12/7) any more since she is remote capable.  Advised I will call back if any further recommendations from Dr. Ladona Ridgelaylor or Dr. [name].  Patient's husband verbalizes understanding and appreciation.

## 2016-08-17 NOTE — Telephone Encounter (Signed)
With this type of data I would defer to the electrophysiologist.  [facility]

## 2016-08-17 NOTE — Telephone Encounter (Signed)
Spoke with patient's husband as patient is in bed due to "a bad vertigo day."  He requests that patient establish with Dr. [name] if she needs to see an electrophysiologist as he also sees Dr. [name].  He feels this will prevent confusion on their end.  Patient's husband is aware that Dr. Jenel LucksAllred's first available appointment is on 09/04/16 and that patient could be seen sooner if she is willing to see Dr. Elberta Fortisamnitz.  He requests that I f/u with MD to "see how urgently" patient should be seen.  Spoke with Dr. Juliane Lackamnitz--12/18 appointment with Dr. [name] is appropriate.  Patient's husband is agreeable to appointment on 09/04/16 at 10:15am.  [address].  He spoke with patient and she is also agreeable.  Patient's husband is appreciative and denies additional questions or concerns at this time.

## 2016-08-17 NOTE — Telephone Encounter (Signed)
Reviewed most recent "AF" episodes from 10/2 and 11/16 with Dr. Rolinda Roanamnitz--ECGs suggest SR w/ frequent PACs.  Brief AF episode noted on 10/14, detected as 7 sec duration "tachy" episode.  Routed to Dr. [name] for review.

## 2016-08-18 ENCOUNTER — Ambulatory Visit: Payer: Medicare HMO | Admitting: *Deleted

## 2016-08-18 DIAGNOSIS — R55 Syncope and collapse: Secondary | ICD-10-CM | POA: Diagnosis not present

## 2016-08-21 NOTE — Telephone Encounter (Signed)
It looks like between [employer] Camnitz and Dr. [name] this situation we handled. I think that the December 18 should be okay.  Is really hard to tell with vertigo symptoms what it truly is related to. Again still better managed by the people who can actually see the monitor strips.  I agree with staying with Dr. [name] simply because the patient are nose Dr. [name].  Bryan Lemmaavid Brok Stocking, MD

## 2016-08-22 ENCOUNTER — Other Ambulatory Visit: Payer: Self-pay | Admitting: Cardiology

## 2016-08-22 DIAGNOSIS — Z9861 Coronary angioplasty status: Secondary | ICD-10-CM | POA: Diagnosis not present

## 2016-08-22 DIAGNOSIS — I10 Essential (primary) hypertension: Secondary | ICD-10-CM | POA: Diagnosis not present

## 2016-08-22 DIAGNOSIS — E039 Hypothyroidism, unspecified: Secondary | ICD-10-CM | POA: Diagnosis not present

## 2016-08-22 DIAGNOSIS — I25810 Atherosclerosis of coronary artery bypass graft(s) without angina pectoris: Secondary | ICD-10-CM | POA: Diagnosis not present

## 2016-08-22 DIAGNOSIS — I495 Sick sinus syndrome: Secondary | ICD-10-CM | POA: Diagnosis not present

## 2016-08-22 DIAGNOSIS — I050 Rheumatic mitral stenosis: Secondary | ICD-10-CM | POA: Diagnosis not present

## 2016-08-22 DIAGNOSIS — I480 Paroxysmal atrial fibrillation: Secondary | ICD-10-CM | POA: Diagnosis not present

## 2016-08-22 DIAGNOSIS — I2510 Atherosclerotic heart disease of native coronary artery without angina pectoris: Secondary | ICD-10-CM | POA: Diagnosis not present

## 2016-08-22 DIAGNOSIS — D649 Anemia, unspecified: Secondary | ICD-10-CM | POA: Diagnosis not present

## 2016-08-22 LAB — CBC
HEMATOCRIT: 39.7 % (ref 35.0–45.0)
HEMOGLOBIN: 13.2 g/dL (ref 11.7–15.5)
MCH: 32.3 pg (ref 27.0–33.0)
MCHC: 33.2 g/dL (ref 32.0–36.0)
MCV: 97.1 fL (ref 80.0–100.0)
MPV: 10 fL (ref 7.5–12.5)
Platelets: 305 10*3/uL (ref 140–400)
RBC: 4.09 MIL/uL (ref 3.80–5.10)
RDW: 13 % (ref 11.0–15.0)
WBC: 5.4 10*3/uL (ref 3.8–10.8)

## 2016-08-22 LAB — LIPID PANEL
CHOLESTEROL: 163 mg/dL (ref <200)
HDL: 40 mg/dL — AB (ref >50)
LDL Cholesterol: 86 mg/dL (ref <100)
TRIGLYCERIDES: 186 mg/dL — AB (ref <150)
Total CHOL/HDL Ratio: 4.1 Ratio (ref <5.0)
VLDL: 37 mg/dL — ABNORMAL HIGH (ref <30)

## 2016-08-22 LAB — COMPREHENSIVE METABOLIC PANEL
ALBUMIN: 4 g/dL (ref 3.6–5.1)
ALT: 18 U/L (ref 6–29)
AST: 17 U/L (ref 10–35)
Alkaline Phosphatase: 45 U/L (ref 33–130)
BILIRUBIN TOTAL: 0.8 mg/dL (ref 0.2–1.2)
BUN: 18 mg/dL (ref 7–25)
CALCIUM: 9.3 mg/dL (ref 8.6–10.4)
CO2: 28 mmol/L (ref 20–31)
CREATININE: 0.85 mg/dL (ref 0.60–0.88)
Chloride: 101 mmol/L (ref 98–110)
Glucose, Bld: 85 mg/dL (ref 65–99)
Potassium: 4.7 mmol/L (ref 3.5–5.3)
SODIUM: 135 mmol/L (ref 135–146)
TOTAL PROTEIN: 6.4 g/dL (ref 6.1–8.1)

## 2016-08-22 LAB — TSH: TSH: 8.23 mIU/L — ABNORMAL HIGH

## 2016-08-24 ENCOUNTER — Other Ambulatory Visit: Payer: Self-pay

## 2016-08-24 ENCOUNTER — Ambulatory Visit (HOSPITAL_COMMUNITY): Payer: Medicare HMO | Attending: Cardiology

## 2016-08-24 DIAGNOSIS — I495 Sick sinus syndrome: Secondary | ICD-10-CM | POA: Diagnosis not present

## 2016-08-24 DIAGNOSIS — Z9861 Coronary angioplasty status: Secondary | ICD-10-CM

## 2016-08-24 DIAGNOSIS — I480 Paroxysmal atrial fibrillation: Secondary | ICD-10-CM

## 2016-08-24 DIAGNOSIS — I4891 Unspecified atrial fibrillation: Secondary | ICD-10-CM | POA: Diagnosis present

## 2016-08-24 DIAGNOSIS — I351 Nonrheumatic aortic (valve) insufficiency: Secondary | ICD-10-CM | POA: Diagnosis not present

## 2016-08-24 DIAGNOSIS — I119 Hypertensive heart disease without heart failure: Secondary | ICD-10-CM | POA: Insufficient documentation

## 2016-08-24 DIAGNOSIS — I050 Rheumatic mitral stenosis: Secondary | ICD-10-CM

## 2016-08-24 DIAGNOSIS — I071 Rheumatic tricuspid insufficiency: Secondary | ICD-10-CM | POA: Diagnosis not present

## 2016-08-24 DIAGNOSIS — D649 Anemia, unspecified: Secondary | ICD-10-CM

## 2016-08-24 DIAGNOSIS — I340 Nonrheumatic mitral (valve) insufficiency: Secondary | ICD-10-CM | POA: Diagnosis not present

## 2016-08-24 DIAGNOSIS — I10 Essential (primary) hypertension: Secondary | ICD-10-CM | POA: Diagnosis not present

## 2016-08-24 DIAGNOSIS — I2510 Atherosclerotic heart disease of native coronary artery without angina pectoris: Secondary | ICD-10-CM | POA: Diagnosis not present

## 2016-08-24 DIAGNOSIS — I272 Pulmonary hypertension, unspecified: Secondary | ICD-10-CM | POA: Insufficient documentation

## 2016-08-24 DIAGNOSIS — E039 Hypothyroidism, unspecified: Secondary | ICD-10-CM | POA: Diagnosis not present

## 2016-08-25 NOTE — Progress Notes (Signed)
Carelink Summary Report / Loop Recorder 

## 2016-08-28 ENCOUNTER — Telehealth: Payer: Self-pay | Admitting: *Deleted

## 2016-08-28 DIAGNOSIS — E039 Hypothyroidism, unspecified: Secondary | ICD-10-CM

## 2016-08-28 NOTE — Telephone Encounter (Signed)
-----   Message from Marykay Lexavid W Harding, MD sent at 08/24/2016 12:39 PM EST ----- Laboratory results: Chemistry panel with kidney function and liver function all looked normal. Potassium level normal. Cholesterol panel shows slightly elevated Trellis rise of 186. HDL (good cholesterol) a little bit low for goal, but still pretty good at 40; LDL 86  TSH is a little high, this would suggest possibly low thyroid levels. We would need to check T3 and T4 levels to confirm or deny.  If possible we can get T3 and T4 levels rechecked in order to provide the data to your primary physician.  Bryan Lemmaavid Harding, MD

## 2016-08-28 NOTE — Telephone Encounter (Signed)
Spoke to representative  to add free T3 nad free T4  TO THE LAB THATT WAS ALREADY COLLECTED.  lab was able to added.  will notify patient of result when available

## 2016-08-28 NOTE — Telephone Encounter (Signed)
Spoke to patient. Result given . Verbalized understanding AWARE WILL CALL UPDATED RESULTS

## 2016-08-29 LAB — T4, FREE: FREE T4: 1.5 ng/dL (ref 0.8–1.8)

## 2016-08-29 LAB — T3, FREE: T3, Free: 3.1 pg/mL (ref 2.3–4.2)

## 2016-09-01 ENCOUNTER — Other Ambulatory Visit: Payer: Self-pay | Admitting: Cardiology

## 2016-09-04 ENCOUNTER — Ambulatory Visit (INDEPENDENT_AMBULATORY_CARE_PROVIDER_SITE_OTHER): Payer: Medicare HMO | Admitting: Internal Medicine

## 2016-09-04 ENCOUNTER — Encounter: Payer: Self-pay | Admitting: Internal Medicine

## 2016-09-04 VITALS — BP 140/64 | HR 71 | Ht 65.0 in | Wt 118.0 lb

## 2016-09-04 DIAGNOSIS — I495 Sick sinus syndrome: Secondary | ICD-10-CM | POA: Diagnosis not present

## 2016-09-04 DIAGNOSIS — I480 Paroxysmal atrial fibrillation: Secondary | ICD-10-CM

## 2016-09-04 DIAGNOSIS — Z4509 Encounter for adjustment and management of other cardiac device: Secondary | ICD-10-CM | POA: Diagnosis not present

## 2016-09-04 DIAGNOSIS — I10 Essential (primary) hypertension: Secondary | ICD-10-CM

## 2016-09-04 DIAGNOSIS — Z9861 Coronary angioplasty status: Secondary | ICD-10-CM

## 2016-09-04 DIAGNOSIS — I2510 Atherosclerotic heart disease of native coronary artery without angina pectoris: Secondary | ICD-10-CM

## 2016-09-04 DIAGNOSIS — R55 Syncope and collapse: Secondary | ICD-10-CM

## 2016-09-04 LAB — CUP PACEART INCLINIC DEVICE CHECK
Date Time Interrogation Session: 20171218142355
Implantable Pulse Generator Implant Date: 20141120

## 2016-09-04 NOTE — Patient Instructions (Addendum)
Medication Instructions:  Your physician has recommended you make the following change in your medication:  1) Stop Digoxin   Labwork: None ordered   Testing/Procedures: None ordered   Follow-Up: Your physician recommends that you schedule a follow-up appointment in: 4 months with Gypsy BalsamAmber Seiler, NP    Any Other Special Instructions Will Be Listed Below (If Applicable).     If you need a refill on your cardiac medications before your next appointment, please call your pharmacy.

## 2016-09-09 NOTE — Progress Notes (Signed)
Electrophysiology Office Note   Date:  09/09/2016   ID:  Deborah [name], DOB 04-03-1931, MRN [id number]  PCP:  Shirline Frees, NP  Cardiologist:  Dr Herbie Baltimore Primary Electrophysiologist: Hillis Range, MD    Chief Complaint  Patient presents with  . Atrial Fibrillation     History of Present Illness: Deborah [name] is a 82 y.o. female who presents today for electrophysiology evaluation.   The patient recently moved to our area from [place].  She has an ILR which was implanted there previously. She has had syncope which was felt to be vagal for about 20 years.   She has been found to have afib.  She also has CAD and severe MR.  She has had pauses on his ILR but has mostly been asymptomatic.  These were previously known to his cardiologist in [place] and they opted to follow conservatively.  She is referred for further EP evaluation.  She is doing reasonably well.   Today, she denies symptoms of palpitations, chest pain, shortness of breath, orthopnea, PND, lower extremity edema, claudication, dizziness, presyncope, syncope, bleeding, or neurologic sequela. The patient is tolerating medications without difficulties and is otherwise without complaint today.    Past Medical History:  Diagnosis Date  . Anxiety   . Bradycardia   . CAD S/P percutaneous coronary angioplasty 11/2014   DES PCI to RCA. Cath was done for pre-operative evaluation for mitral repair; LM 30%, LAD 50%, RCA 90% (Promus DES)  . 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 Normal EF by echo.  . Diverticulitis   . Essential hypertension   . H/O: upper GI bleed 12/03/2014   While on ASA/Plavix & Warfarin --> Hct dropped to 18%; small AVM noted but no overt pathology.  [name] History of uterine cancer 2001   Status post hysterectomy  . Hyperlipidemia with target LDL less than 70    Coronary disease and thoracic aortic atheroma  . Hypothyroidism    On Levothyroxine  .  Paroxysmal atrial fibrillation (HCC) 2014   Associated with sick sinus syndrome. -- Intolerant of beta blockers and diltiazem. Rate control with digoxin. Not on anticoagulation despite CHA2DS2Vasc 6  2/2 prior severe GI bleed on triple therapy  . PFO (patent foramen ovale)    Noted on TEE - L-R shunt (elsewhere reported it as a ASD)  . Severe mitral regurgitation by prior echocardiogram 09/2014   Seen on TEE to have severe MR with multiple jets. Vena contracted 0.8; Consideration had been ? Mitral E-Clip - put on hold after GI Bleed.  . Sick sinus syndrome (HCC)    Status post Loop Recorder: [employer] Linq -- most recent interrogation 05/19/2016: 2 new pauses. One greater than 3 seconds at 0 320 the morning. Second was 4.4 seconds at 9:56 PM; also noted to have an episode of A. fib. --> Recommended further follow-up upon arrival to [facility].  . Thoracic aortic atherosclerosis (HCC) 09/2014   Moderate grade 3-4 atheroma noted on TEE  . Urinary tract infection   . Vasovagal syncope    Exacerbated by bradycardia, sick sinus syndrome, and mitral regurgitation.  . Vertigo, central, unspecified laterality    Chronic dizziness.   Past Surgical History:  Procedure Laterality Date  . ABDOMINAL HYSTERECTOMY  2001  . Cataract Surgery  Bilateral   . COLONOSCOPY W/ BIOPSIES  2006   2 polyps noted along with diverticulitis  . CORONARY ANGIOPLASTY WITH STENT PLACEMENT  11/2014  [facility]: 90% RCA - PCI with Promus DES.  [name]. implanted cardiac monitor     . LOOP RECORDER INSERTION  07/24/2013   Medtronic Linq - to evaluate SSS for symptomatic bradycardia  . Right and Left Heart Catheterization  11/2014   RHC: RAP 4, RVP 32/4, PA peak 29/7/16. LVEDP 18.  CORS: 30% LM, 50% LAD, 90% RCA --> PCI (Promus DES)  . TEE WITHOUT CARDIOVERSION  09/2014   Normal LV function. Severe MR with multiple jets. Vena contractile 0.8.  PFO with L-R shunt; moderate grade 3-4  aortic atheroma  . TONSILLECTOMY    . TRANSTHORACIC ECHOCARDIOGRAM  07/2013   Severe left atrial enlargement (volume 57 mL). Normal EF. Moderate TR. ASD. Moderate MR. No pulmonary hypertension.  . TRANSTHORACIC ECHOCARDIOGRAM  08/2014   Normal LV function with biatrial enlargement. Moderate-severe mitral and tricuspid insufficiency.     Current Outpatient Prescriptions  Medication Sig Dispense Refill  . aspirin EC 81 MG tablet Take 81 mg by mouth daily.    . ferrous sulfate 325 (65 FE) MG tablet Take 325 mg by mouth 2 (two) times daily with a meal.     . fluticasone (FLONASE) 50 MCG/ACT nasal spray Place 2 sprays into both nostrils daily as needed for allergies or rhinitis.     . furosemide (LASIX) 20 MG tablet Take 20 mg by mouth every other day.     . levothyroxine (SYNTHROID, LEVOTHROID) 75 MCG tablet Take 75 mcg by mouth daily before breakfast.    . losartan (COZAAR) 25 MG tablet Take 25 mg by mouth daily.    . meclizine (ANTIVERT) 25 MG tablet Take 25 mg by mouth 3 (three) times daily as needed for dizziness.    [name]. omeprazole (PRILOSEC) 20 MG capsule Take 20 mg by mouth daily.    . Polyethylene Glycol 3350 (MIRALAX PO) Take 1/2 dose by mouth daily    . potassium chloride (K-DUR,KLOR-CON) 10 MEQ tablet Take 10 mEq by mouth 2 (two) times daily.    . simvastatin (ZOCOR) 40 MG tablet Take 40 mg by mouth daily.     No current facility-administered medications for this visit.     Allergies:   Carvedilol; Diltiazem; and Lopressor [metoprolol]   Social History:  The patient  reports that she quit smoking about 46 years ago. Her smoking use included Cigarettes. She has a 2.50 pack-year smoking history. She has never used smokeless tobacco. She reports that she drinks alcohol. She reports that she does not use drugs.   Family History:  The patient's  family history includes Cancer in her brother and daughter; Diabetes in her brother; Stomach cancer in her father.    ROS:  Please see the  history of present illness.   All other systems are reviewed and negative.    PHYSICAL EXAM: VS:  BP 140/64   Pulse 71   Ht 5\' 5"  (1.651 m)   Wt 118 lb (53.5 kg)   LMP  (LMP Unknown)   BMI 19.64 kg/m  , BMI Body mass index is 19.64 kg/m. GEN: elderly and frail, in no acute distress  HEENT: normal  Neck: no JVD, carotid bruits, or masses Cardiac: RRR; 2/6 SEM ,no edema  Respiratory:  clear to auscultation bilaterally, normal work of breathing GI: soft, nontender, nondistended, + BS MS: no deformity or atrophy  Skin: warm and dry  Neuro:  Strength and sensation are intact Psych: euthymic mood, full affect  EKG:  EKG from 07/31/16  reviewed [place] are [place] ILR is reviewed at length   Recent Labs: 08/22/2016: ALT 18; BUN 18; Creat 0.85; Hemoglobin 13.2; Platelets 305; Potassium 4.7; Sodium 135; TSH 8.23    Lipid Panel     Component Value Date/Time   CHOL 163 08/22/2016 0949   TRIG 186 (H) 08/22/2016 0949   HDL 40 (L) 08/22/2016 0949   CHOLHDL 4.1 08/22/2016 0949   VLDL 37 (H) 08/22/2016 0949   LDLCALC 86 08/22/2016 0949     Wt Readings from Last 3 Encounters:  09/04/16 118 lb (53.5 kg)  07/31/16 116 lb 9.6 oz (52.9 kg)  07/13/16 114 lb (51.7 kg)     ASSESSMENT AND PLAN:  1.  Sinus pauses The patient is noted to have pauses on ILR, mostly nocturnal and none with symptoms.  She is clear that she would like to avoid pacing at this time. We will therefore follow conservatively with ILR.  Should she have symptomatic pauses with presyncope/ syncope then she may be more willing to consider pacing at that time.  Her ILR is at RRT.  We will continue to follow until battery is no longer functioning and then discuss removal at that time.  Stop digoxin which may be contributing to brady episodes.  2. Paroxysmal afib Infrequent, rate controlled Stop digoxin as above chads2vasc score is at least 6.  She had bleeding previously on triple therapy.  Would consider  stopping antiplatelets and trying NOAC therapy with eliquis.  I will defer to Dr [name]  3. HTN Stable No change required today  4. CAD No ischemic symptoms No changes today   Follow-up with EP NP in 4 months Follow-up with Dr [name] as scheduled I will see when needed  Signed, Hillis RangeJames Jalin Alicea, MD    [facility] [address] [place] [address] [phone] (office) [phone] (fax)

## 2016-10-03 ENCOUNTER — Ambulatory Visit (INDEPENDENT_AMBULATORY_CARE_PROVIDER_SITE_OTHER): Payer: Medicare HMO | Admitting: Cardiology

## 2016-10-03 ENCOUNTER — Encounter: Payer: Self-pay | Admitting: Cardiology

## 2016-10-03 VITALS — BP 168/71 | HR 71 | Ht 63.0 in | Wt 119.6 lb

## 2016-10-03 DIAGNOSIS — I480 Paroxysmal atrial fibrillation: Secondary | ICD-10-CM | POA: Diagnosis not present

## 2016-10-03 DIAGNOSIS — I2510 Atherosclerotic heart disease of native coronary artery without angina pectoris: Secondary | ICD-10-CM | POA: Diagnosis not present

## 2016-10-03 DIAGNOSIS — I050 Rheumatic mitral stenosis: Secondary | ICD-10-CM | POA: Diagnosis not present

## 2016-10-03 DIAGNOSIS — I10 Essential (primary) hypertension: Secondary | ICD-10-CM | POA: Diagnosis not present

## 2016-10-03 DIAGNOSIS — E785 Hyperlipidemia, unspecified: Secondary | ICD-10-CM

## 2016-10-03 DIAGNOSIS — I5032 Chronic diastolic (congestive) heart failure: Secondary | ICD-10-CM

## 2016-10-03 DIAGNOSIS — I38 Endocarditis, valve unspecified: Secondary | ICD-10-CM

## 2016-10-03 MED ORDER — LOSARTAN POTASSIUM 25 MG PO TABS: 25.0000 mg | ORAL_TABLET | Freq: Every day | ORAL | 3 refills | Status: DC

## 2016-10-03 MED ORDER — SIMVASTATIN 40 MG PO TABS: 40.0000 mg | ORAL_TABLET | Freq: Every day | ORAL | 3 refills | Status: DC

## 2016-10-03 MED ORDER — SIMVASTATIN 20 MG PO TABS: 20.0000 mg | ORAL_TABLET | Freq: Every day | ORAL | 3 refills | Status: DC

## 2016-10-03 NOTE — Patient Instructions (Addendum)
Change with medication  Do not take simvastatin 40 mg  for 2 weeks then reduce to 1/2 tablet( 20 mg) daily    No other changes , medication refilled   Your physician wants you to follow-up in early- MAY 2018  WITH DR HARDING. You will receive a reminder letter in the mail two months in advance. If you don't receive a letter, please call our office to schedule the follow-up appointment.   If you need a refill on your cardiac medications before your next appointment, please call your pharmacy.

## 2016-10-03 NOTE — Progress Notes (Signed)
PCP: Shirline Frees, NP  Former Cardiologist: Lanae Boast - [facility] / [place] [phone]); fax number: [phone])  address: [address] [place]ie [place], [place] [address] Former PCP: Dr. Hazeline Junker [facility]  Clinic Note: Chief Complaint  Patient presents with  . Follow-up    CAD  . Dizziness    frequently; randomly.    HPI: Deborah [name] is a 83 y.o. female with a PMH notable for A. fib and bradycardia/sick sinus syndrome as well as CAD-PCI RCA, severe MR, diastolic CHF and PFO/Atrial Septal Defect. who presents today for establishing cardiology care. She also has hypertension, hyperlipidemia and hypothyroidism. She has had a loop recorder placed (nearing end of life)  Deborah [name] was last seen on   Studies Reviewed: 2 echocardiogram reports, TEE and cardiac cath report as well as loop recorder interrogation are reviewed (noted in [facility] as well as above)  2-D echo from 08/24/2016 - She  in atrial fibrillation. Normal LV size with mild LV hypertrophy. EF 60-65%. Normal RV size and systolic function.  Severe left atrial enlargement. Moderate aortic insufficiency, moderate mitral regurgitation, mild to moderate tricuspid regurgitation. Mild pulmonary hypertension.  She was seen by Dr. Johney Frame on December 18 -- she did not want to undergo pacemaker that time, unless she were to start symptoms. Her recorder is now at end-of-life. Digoxin has been stopped  Interval History: [name] presents here today for for routine follow-up without any major complaints. She really hasn't had any change of her symptoms, and this hasn't felt normal since her stent was done a couple years ago. Thankfully, she has not had any further syncopal episodes and therefore has not been interested in pacemaker. Dr. Johney Frame did stop her digoxin and she has not noted any abnormal palpitations since She also denies any rapid irregular heartbeats or any  sensation of irregularity to her heart rhythm to suggest recurrence of A. Fib.  She has not had any resting or exertional chest tightness or pressure. She does get little bit short of breath when she exerts herself cup, but she does acknowledge being somewhat deconditioned as well. She has not had any significant near syncopal events or dizziness spells since arriving here that she can think of. She is only taking one half pill of furosemide a day As far as her vertigo is been quite stable, but she still has some dizzy spells. She has to sleep on 2 pillows to help her vertigo more than any orthopnea or PND. No edema with minimal dose of Lasix. She sleeps on 2 pillows more because of vertigo and dyspnea.  No TIA/amaurosis fugax symptoms. No melena, hematochezia, hematuria, or epistaxis- she is only on aspirin currently; no further GI Bleed No claudication.  No flowsheet data found.  ROS: A comprehensive was performed. Review of Systems  Constitutional: Negative for chills, fever, malaise/fatigue and weight loss.  HENT: Negative for congestion, nosebleeds and sinus pain.   Eyes: Negative.   Respiratory: Negative for cough, shortness of breath and wheezing.   Cardiovascular:       Per history of present illness  Gastrointestinal: Negative for blood in stool, constipation, heartburn and melena.  Genitourinary: Negative for frequency and hematuria.  Musculoskeletal: Positive for back pain and joint pain. Negative for falls.  Skin: Negative.   Neurological: Positive for dizziness (Vertigo) and headaches. Negative for tingling, seizures and loss of consciousness.  Psychiatric/Behavioral: Negative for depression and memory loss. The patient is not nervous/anxious and does not have  insomnia.   All other systems reviewed and are negative.   Past Medical History:  Diagnosis Date  . Anxiety   . Bradycardia   . CAD S/P percutaneous coronary angioplasty 11/2014   DES PCI to RCA. Cath was done for  pre-operative evaluation for mitral repair; LM 30%, LAD 50%, RCA 90% (Promus DES)  . 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 Normal EF by echo.  . Diverticulitis   . Essential hypertension   . H/O: upper GI bleed 12/03/2014   While on ASA/Plavix & Warfarin --> Hct dropped to 18%; small AVM noted but no overt pathology.  [name] History of uterine cancer 2001   Status post hysterectomy  . Hyperlipidemia with target LDL less than 70    Coronary disease and thoracic aortic atheroma  . Hypothyroidism    On Levothyroxine  . Paroxysmal atrial fibrillation (HCC) 2014   Associated with sick sinus syndrome. -- Intolerant of beta blockers and diltiazem. Rate control with digoxin. Not on anticoagulation despite CHA2DS2Vasc 6  2/2 prior severe GI bleed on triple therapy  . PFO (patent foramen ovale)    Noted on TEE - L-R shunt (elsewhere reported it as a ASD)  . Severe mitral regurgitation by prior echocardiogram 09/2014   Seen on TEE to have severe MR with multiple jets. Vena contracted 0.8; Consideration had been ? Mitral E-Clip - put on hold after GI Bleed.  . Sick sinus syndrome (HCC)    Status post Loop Recorder: Medtronic Linq -- most recent interrogation 05/19/2016: 2 new pauses. One greater than 3 seconds at 0 320 the morning. Second was 4.4 seconds at 9:56 PM; also noted to have an episode of A. fib. --> Recommended further follow-up upon arrival to [facility].  . Thoracic aortic atherosclerosis (HCC) 09/2014   Moderate grade 3-4 atheroma noted on TEE  . Urinary tract infection   . Vasovagal syncope    Exacerbated by bradycardia, sick sinus syndrome, and mitral regurgitation.  . Vertigo, central, unspecified laterality    Chronic dizziness.  -- 05/19/2016 by Unice Bailey, PA-C (under the supervision of Dr. Norval Gable) -- this was for a loop recorder interrogation. Demonstrated one episode of A. fib but to pauses. One was during  sleeping hours (0 3:20 AM) lasting 3 seconds, the second was 4.4 seconds at 9:56 PM. There was no sense of any symptoms as a result. It is unclear if she actually had a fall or any dizziness with this episode.  Past Surgical History:  Procedure Laterality Date  . ABDOMINAL HYSTERECTOMY  2001  . Cataract Surgery  Bilateral   . COLONOSCOPY W/ BIOPSIES  2006   2 polyps noted along with diverticulitis  . CORONARY ANGIOPLASTY WITH STENT PLACEMENT  11/2014   [facility]: 90% RCA - PCI with Promus DES.  [name] implanted cardiac monitor     . LOOP RECORDER INSERTION  07/24/2013   Medtronic Linq - to evaluate SSS for symptomatic bradycardia  . Right and Left Heart Catheterization  11/2014   RHC: RAP 4, RVP 32/4, PA peak 29/7/16. LVEDP 18.  CORS: 30% LM, 50% LAD, 90% RCA --> PCI (Promus DES)  . TEE WITHOUT CARDIOVERSION  09/2014   Normal LV function. Severe MR with multiple jets. Vena contractile 0.8.  PFO with L-R shunt; moderate grade 3-4 aortic atheroma  . TONSILLECTOMY    . TRANSTHORACIC ECHOCARDIOGRAM  07/2013   Severe left atrial enlargement (  volume 57 mL). Normal EF. Moderate TR. ASD. Moderate MR. No pulmonary hypertension.  . TRANSTHORACIC ECHOCARDIOGRAM  08/2014   Normal LV function with biatrial enlargement. Moderate-severe mitral and tricuspid insufficiency.   No outpatient prescriptions have been marked as taking for the 10/03/16 encounter (Office Visit) with Marykay Lexavid W Harding, MD.    Allergies  Allergen Reactions  . Carvedilol Other (See Comments)    Bradycardia   . Diltiazem Other (See Comments)    Bradycardia  . Lopressor [Metoprolol] Other (See Comments)    Bradycardia    Social History   Social History  . Marital status: Married    Spouse name: N/A  . Number of children: 2  . Years of education: N/A   Occupational History  .      Retired Passenger transport manageregistrar   Social History Main Topics  . Smoking status: Former Smoker    Packs/day: 0.50      Years: 5.00    Types: Cigarettes    Quit date: 08/02/1970  . Smokeless tobacco: Never Used  . Alcohol use Yes     Comment: "wine occasionally"  . Drug use: No  . Sexual activity: Not Asked   Other Topics Concern  . None   Social History Narrative   Married for 59 years    Has a son, daughter died of breast cancer   One grandson    [place] Lives there from [place]- October) to be closer to their son & only remaining family.   Family History  Problem Relation Age of Onset  . Stomach cancer Father   . Cancer Brother   . Diabetes Brother   . Cancer Daughter     Wt Readings from Last 3 Encounters:  10/03/16 54.3 kg (119 lb 9.6 oz)  09/04/16 53.5 kg (118 lb)  07/31/16 52.9 kg (116 lb 9.6 oz)    PHYSICAL EXAM BP (!) 168/71   Pulse 71   Ht 5\' 3"  (1.6 m)   Wt 54.3 kg (119 lb 9.6 oz)   LMP  (LMP Unknown)   BMI 21.19 kg/m  General appearance: alert, cooperative, appears stated age, no distress; Well-nourished and well-groomed. HEENT: [place]/AT, EOMI, MMM, anicteric sclera Neck: no adenopathy, no carotid bruit and no JVD Lungs: clear to auscultation bilaterally, normal percussion bilaterally and non-labored Heart: RRR, non-displaced PMI. Normal S1&S2. No R/G. 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 Pulses: 2+ and symmetric;  Skin: temperature normal, vascularity normal and thin skin c/w age with decreased turgor or telangiectasias - lower leg(s) bilateral, ankle(s) bilateral and varicosities - lower leg(s) bilateral, ankle(s) bilateral Neurologic: Mental status: Alert, oriented, thought content appropriate; Pleasant mood & affect. steady gail    Adult ECG Report  Rate: 71 ;  Rhythm: normal sinus rhythm and Left axis deviation (-33); Otherwise normal durations,  intervals and voltage  Narrative Interpretation: Relatively normal EKG  Other studies Reviewed: Additional studies/ records that were reviewed today include:  Recent Labs:  None currently available    ASSESSMENT / PLAN: Problem List Items Addressed This Visit    Coronary artery disease involving native heart without angina pectoris (Chronic)    She never really had angina -- CAD was found as part of her Mitral Procedure pre-op eval. Continue ASA, statin & ARB.  Not on BB b/c of bradycardia.  Relevant Medications   losartan (COZAAR) 25 MG tablet   simvastatin (ZOCOR) 20 MG tablet   Severe mitral valve stenosis (Chronic)    Only Moderate MR noted on current Echo - may well have been partially ischemic & improved with RCA PCI. No obvious PFO noted - will follow for now as she is not really interested in invasive procedures. Need to keep BP controlled (but have to also allow for mild permissive HTN.       Relevant Medications   losartan (COZAAR) 25 MG tablet   simvastatin (ZOCOR) 20 MG tablet   Paroxysmal atrial fibrillation (HCC): CHA2DS2Vasc = 6; Not currently on AC. (Chronic)    Not currently on rate control agent with Digoxin being stopped -- has had significant Bradycardia / pauses on ILR.  No plan for PPM. For now continue ASA without full OAC per her desires.  Can continue to discuss.      Relevant Medications   losartan (COZAAR) 25 MG tablet   simvastatin (ZOCOR) 20 MG tablet   Other Relevant Orders   EKG 12-Lead   Essential hypertension - Primary (Chronic)    Was borderline hypotensive last episode saw her. She has labile pressures and therefore I'm reluctant to titrate medications very much.      Relevant Medications   losartan (COZAAR) 25 MG tablet   simvastatin (ZOCOR) 20 MG tablet   Other Relevant Orders   EKG 12-Lead   Chronic diastolic heart failure due to valvular disease (HCC) (Chronic)    She seems euvolemic today. On stable low-dose Lasix.  Discussed potential for when necessary dosing. But otherwise doing well.  Using 20 mg and not 10 mg of Lasix. Continue losartan for afterload reduction. May need to titrate up as she is now no longer on rate control agents.       Relevant Medications   losartan (COZAAR) 25 MG tablet   simvastatin (ZOCOR) 20 MG tablet   Hyperlipidemia with target LDL less than 70 (Chronic)    He has coronary disease and thoracic aortic atheroma noted on prior studies. He is on simvastatin, but all of her aches and fatigue, I would like to see how she does off of it for sure. Time. We can reduce to 20 mg daily. May simply need to convert to a different statin if her labs start to get worse.      Relevant Medications   losartan (COZAAR) 25 MG tablet   simvastatin (ZOCOR) 20 MG tablet      Current medicines are reviewed at length with the patient today. (+/- concerns) none The following changes have been made: No changes  Patient Instructions   Change with medication  Do not take simvastatin 40 mg  for 2 weeks then reduce to 1/2 tablet( 20 mg) daily    No other changes , medication refilled   Your physician wants you to follow-up in early- MAY 2018  WITH DR HARDING. You will receive a reminder letter in the mail two months in advance. If you don't receive a letter, please call our office to schedule the follow-up appointment.   If you need a refill on your cardiac medications before your next appointment, please call your pharmacy.    Studies Ordered:   Orders Placed This Encounter  Procedures  . EKG 12-Lead      Bryan Lemma, M.D., M.S. Interventional Cardiologist   Pager # [phone] Phone # [phone] [address] [place], [place] [address]

## 2016-10-05 ENCOUNTER — Encounter: Payer: Self-pay | Admitting: Cardiology

## 2016-10-05 NOTE — Assessment & Plan Note (Signed)
Only Moderate MR noted on current Echo - may well have been partially ischemic & improved with RCA PCI. No obvious PFO noted - will follow for now as she is not really interested in invasive procedures. Need to keep BP controlled (but have to also allow for mild permissive HTN.

## 2016-10-05 NOTE — Assessment & Plan Note (Signed)
Was borderline hypotensive last episode saw her. She has labile pressures and therefore I'm reluctant to titrate medications very much.

## 2016-10-05 NOTE — Assessment & Plan Note (Signed)
Not currently on rate control agent with Digoxin being stopped -- has had significant Bradycardia / pauses on ILR.  No plan for PPM. For now continue ASA without full OAC per her desires.  Can continue to discuss.

## 2016-10-05 NOTE — Assessment & Plan Note (Signed)
She seems euvolemic today. On stable low-dose Lasix. Discussed potential for when necessary dosing. But otherwise doing well.  Using 20 mg and not 10 mg of Lasix. Continue losartan for afterload reduction. May need to titrate up as she is now no longer on rate control agents.

## 2016-10-05 NOTE — Assessment & Plan Note (Addendum)
He has coronary disease and thoracic aortic atheroma noted on prior studies. He is on simvastatin, but all of her aches and fatigue, I would like to see how she does off of it for sure. Time. We can reduce to 20 mg daily. May simply need to convert to a different statin if her labs start to get worse.

## 2016-10-05 NOTE — Assessment & Plan Note (Addendum)
She never really had angina -- CAD was found as part of her Mitral Procedure pre-op eval. Continue ASA, statin & ARB.  Not on BB b/c of bradycardia.

## 2016-10-09 ENCOUNTER — Telehealth: Payer: Self-pay | Admitting: Cardiology

## 2016-10-09 NOTE — Telephone Encounter (Signed)
[facility] called and would like some clarification on which prescription to fill. The pharmacy received two prescriptions for  simvastatin (ZOCOR) 20 MG or Simvastatin 40 mg. Please confirm, thanks.

## 2016-10-09 NOTE — Telephone Encounter (Signed)
Called and left msg on automated system for this request.

## 2016-10-11 ENCOUNTER — Other Ambulatory Visit: Payer: Self-pay

## 2016-10-11 MED ORDER — LOSARTAN POTASSIUM 25 MG PO TABS: 25.0000 mg | ORAL_TABLET | Freq: Every day | ORAL | 3 refills | Status: DC

## 2016-10-11 MED ORDER — OMEPRAZOLE 20 MG PO CPDR: 20.0000 mg | DELAYED_RELEASE_CAPSULE | Freq: Every day | ORAL | 3 refills | Status: DC

## 2016-10-11 MED ORDER — POTASSIUM CHLORIDE CRYS ER 10 MEQ PO TBCR: 10.0000 meq | EXTENDED_RELEASE_TABLET | Freq: Two times a day (BID) | ORAL | 3 refills | Status: DC

## 2016-10-11 MED ORDER — FUROSEMIDE 20 MG PO TABS: 20.0000 mg | ORAL_TABLET | ORAL | 3 refills | Status: DC

## 2016-12-20 ENCOUNTER — Encounter: Payer: Self-pay | Admitting: Adult Health

## 2016-12-20 ENCOUNTER — Other Ambulatory Visit: Payer: Self-pay

## 2016-12-20 MED ORDER — LEVOTHYROXINE SODIUM 75 MCG PO TABS: 75.0000 ug | ORAL_TABLET | Freq: Every day | ORAL | 1 refills | Status: DC

## 2017-01-02 NOTE — Progress Notes (Signed)
Electrophysiology Office Note   Date:  01/03/2017   ID:  Deborah [name], DOB [birth year], MRN [id number]  PCP:  Shirline Frees, NP  Cardiologist:  Dr Herbie Baltimore Primary Electrophysiologist: Johney Frame  Deborah [name] is a 83 y.o. female seen today for Dr Johney Frame.  She presents today for routine electrophysiology follow up.  At last visit with Dr Johney Frame, Digoxin was discontinued with bradycardia documented on ILR.  Today, she denies symptoms of palpitations, chest pain, shortness of breath, orthopnea, PND, lower extremity edema, claudication, dizziness, presyncope, syncope, bleeding, or neurologic sequela. The patient is tolerating medications without difficulties and is otherwise without complaint today.    Past Medical History:  Diagnosis Date  . Anxiety   . Bradycardia   . CAD S/P percutaneous coronary angioplasty 11/2014   DES PCI to RCA. Cath was done for pre-operative evaluation for mitral repair; LM 30%, LAD 50%, RCA 90% (Promus DES)  . 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 Normal EF by echo.  . Diverticulitis   . Essential hypertension   . H/O: upper GI bleed 12/03/2014   While on ASA/Plavix & Warfarin --> Hct dropped to 18%; small AVM noted but no overt pathology.  [name] History of uterine cancer 2001   Status post hysterectomy  . Hyperlipidemia with target LDL less than 70    Coronary disease and thoracic aortic atheroma  . Hypothyroidism    On Levothyroxine  . Paroxysmal atrial fibrillation (HCC) 2014   Associated with sick sinus syndrome. -- Intolerant of beta blockers and diltiazem. Rate control with digoxin. Not on anticoagulation despite CHA2DS2Vasc 6  2/2 prior severe GI bleed on triple therapy  . PFO (patent foramen ovale)    Noted on TEE - L-R shunt (elsewhere reported it as a ASD)  . Severe mitral regurgitation by prior echocardiogram 09/2014   Seen on TEE to have severe MR with multiple jets. Vena  contracted 0.8; Consideration had been ? Mitral E-Clip - put on hold after GI Bleed.  . Sick sinus syndrome (HCC)    Status post Loop Recorder: Medtronic Linq -- most recent interrogation 05/19/2016: 2 new pauses. One greater than 3 seconds at 0 320 the morning. Second was 4.4 seconds at 9:56 PM; also noted to have an episode of A. fib. --> Recommended further follow-up upon arrival to [facility].  . Thoracic aortic atherosclerosis (HCC) 09/2014   Moderate grade 3-4 atheroma noted on TEE  . Urinary tract infection   . Vasovagal syncope    Exacerbated by bradycardia, sick sinus syndrome, and mitral regurgitation.  . Vertigo, central, unspecified laterality    Chronic dizziness.   Past Surgical History:  Procedure Laterality Date  . ABDOMINAL HYSTERECTOMY  2001  . Cataract Surgery  Bilateral   . COLONOSCOPY W/ BIOPSIES  2006   2 polyps noted along with diverticulitis  . CORONARY ANGIOPLASTY WITH STENT PLACEMENT  11/2014   [facility]: 90% RCA - PCI with Promus DES.  [name] implanted cardiac monitor     . LOOP RECORDER INSERTION  07/24/2013   Medtronic Linq - to evaluate SSS for symptomatic bradycardia  . Right and Left Heart Catheterization  11/2014   RHC: RAP 4, RVP 32/4, PA peak 29/7/16. LVEDP 18.  CORS: 30% LM, 50% LAD, 90% RCA --> PCI (Promus DES)  . TEE WITHOUT CARDIOVERSION  09/2014   Normal LV function. Severe MR with multiple jets. Vena contractile 0.8.  PFO  with L-R shunt; moderate grade 3-4 aortic atheroma  . TONSILLECTOMY    . TRANSTHORACIC ECHOCARDIOGRAM  07/2013   Severe left atrial enlargement (volume 57 mL). Normal EF. Moderate TR. ASD. Moderate MR. No pulmonary hypertension.  . TRANSTHORACIC ECHOCARDIOGRAM  08/2014   Normal LV function with biatrial enlargement. Moderate-severe mitral and tricuspid insufficiency.     Current Outpatient Prescriptions  Medication Sig Dispense Refill  . aspirin EC 81 MG tablet Take 81 mg by mouth  daily.    . ferrous sulfate 325 (65 FE) MG tablet Take 325 mg by mouth 2 (two) times daily with a meal.     . fluticasone (FLONASE) 50 MCG/ACT nasal spray Place 2 sprays into both nostrils daily as needed for allergies or rhinitis.     . furosemide (LASIX) 20 MG tablet Take 1 tablet (20 mg total) by mouth every other day. 90 tablet 3  . levothyroxine (SYNTHROID, LEVOTHROID) 75 MCG tablet Take 1 tablet (75 mcg total) by mouth daily before breakfast. 90 tablet 1  . losartan (COZAAR) 25 MG tablet Take 1 tablet (25 mg total) by mouth daily. 90 tablet 3  . meclizine (ANTIVERT) 25 MG tablet Take 25 mg by mouth 3 (three) times daily as needed for dizziness.    [name] omeprazole (PRILOSEC) 20 MG capsule Take 1 capsule (20 mg total) by mouth daily. 90 capsule 3  . Polyethylene Glycol 3350 (MIRALAX PO) Take 1/2 dose by mouth daily    . potassium chloride (K-DUR,KLOR-CON) 10 MEQ tablet Take 1 tablet (10 mEq total) by mouth 2 (two) times daily. 90 tablet 3  . simvastatin (ZOCOR) 20 MG tablet Take 1 tablet (20 mg total) by mouth at bedtime. 90 tablet 3   No current facility-administered medications for this visit.     Allergies:   Carvedilol; Diltiazem; and Lopressor [metoprolol]   Social History:  The patient  reports that she quit smoking about 46 years ago. Her smoking use included Cigarettes. She has a 2.50 pack-year smoking history. She has never used smokeless tobacco. She reports that she drinks alcohol. She reports that she does not use drugs.   Family History:  The patient's  family history includes Cancer in her brother and daughter; Diabetes in her brother; Stomach cancer in her father.    ROS:  Please see the history of present illness.   All other systems are reviewed and negative.    PHYSICAL EXAM: VS:  BP 112/62   Pulse (!) 49   Ht  (1.6 m)   Wt 121 lb (54.9 kg)   LMP  (LMP Unknown)   SpO2 98%   BMI 21.43 kg/m  , BMI Body mass index is 21.43 kg/m. GEN: elderly and frail, in no  acute distress  HEENT: normal  Neck: no JVD, carotid bruits, or masses Cardiac: iRRR; 2/6 SEM ,no edema  Respiratory:  clear to auscultation bilaterally, normal work of breathing GI: soft, nontender, nondistended, + BS MS: no deformity or atrophy  Skin: warm and dry  Neuro:  Strength and sensation are intact Psych: euthymic mood, full affect  EKG is not done today   Recent Labs: 08/22/2016: ALT 18; BUN 18; Creat 0.85; Hemoglobin 13.2; Platelets 305; Potassium 4.7; Sodium 135; TSH 8.23    Lipid Panel     Component Value Date/Time   CHOL 163 08/22/2016 0949   TRIG 186 (H) 08/22/2016 0949   HDL 40 (L) 08/22/2016 0949   CHOLHDL 4.1 08/22/2016 0949   VLDL 37 (H) 08/22/2016  0949   LDLCALC 86 08/22/2016 0949     Wt Readings from Last 3 Encounters:  01/03/17 121 lb (54.9 kg)  10/03/16 119 lb 9.6 oz (54.3 kg)  09/04/16 118 lb (53.5 kg)     ASSESSMENT AND PLAN:  1.  Sinus pauses The patient is noted to have pauses on ILR, mostly nocturnal and none with symptoms.  She is clear that she would like to avoid pacing at this time. Improved off digoxin Will continue to follow. She is aware to call the office for symptoms.  ILR at RRT.  Will keep in until unable to communicate with device.   2. Paroxysmal afib Burden by device interrogation 0.2% V rates elevated.  Unfortunately, we are unable to rate control with underlying sinus pauses. She is asymptomatic. We have discussed symptoms to be aware of and she is to call the office if she develops exercise intolerance, fatigue, palpitations or shortness of breath. chads2vasc score is at least 6.  She had bleeding previously on triple therapy.  Dr Herbie Baltimore discussed with patient at last office visit who declined reattempt at [facility]. We discussed again today. She is clear in her decision to decline anticoagulation  3. HTN Stable No change required today  4. CAD No ischemic symptoms No changes today   Follow-up with me in 6 months    Signed, Gypsy Balsam, NP  01/03/2017 11:12 AM [facility] [address] [place] [address] [phone] (office) [phone] (fax)

## 2017-01-03 ENCOUNTER — Ambulatory Visit (INDEPENDENT_AMBULATORY_CARE_PROVIDER_SITE_OTHER): Payer: Medicare HMO | Admitting: Nurse Practitioner

## 2017-01-03 ENCOUNTER — Encounter: Payer: Self-pay | Admitting: Nurse Practitioner

## 2017-01-03 VITALS — BP 112/62 | HR 49 | Ht 63.0 in | Wt 121.0 lb

## 2017-01-03 DIAGNOSIS — I2510 Atherosclerotic heart disease of native coronary artery without angina pectoris: Secondary | ICD-10-CM

## 2017-01-03 DIAGNOSIS — I480 Paroxysmal atrial fibrillation: Secondary | ICD-10-CM | POA: Diagnosis not present

## 2017-01-03 DIAGNOSIS — I455 Other specified heart block: Secondary | ICD-10-CM | POA: Diagnosis not present

## 2017-01-03 DIAGNOSIS — I10 Essential (primary) hypertension: Secondary | ICD-10-CM | POA: Diagnosis not present

## 2017-01-03 NOTE — Patient Instructions (Signed)
Medication Instructions:  None Ordered   Labwork: None Ordered   Testing/Procedures: None Ordered   Follow-Up: Your physician wants you to follow-up in: 6 months with Amber Seiler, NP. You will receive a reminder letter in the mail two months in advance. If you don't receive a letter, please call our office to schedule the follow-up appointment.   Any Other Special Instructions Will Be Listed Below (If Applicable).     If you need a refill on your cardiac medications before your next appointment, please call your pharmacy.   

## 2017-01-18 ENCOUNTER — Ambulatory Visit (INDEPENDENT_AMBULATORY_CARE_PROVIDER_SITE_OTHER): Payer: Medicare HMO | Admitting: Cardiology

## 2017-01-18 ENCOUNTER — Encounter: Payer: Self-pay | Admitting: Cardiology

## 2017-01-18 VITALS — BP 116/74 | HR 56 | Ht 65.0 in | Wt 122.4 lb

## 2017-01-18 DIAGNOSIS — E785 Hyperlipidemia, unspecified: Secondary | ICD-10-CM | POA: Diagnosis not present

## 2017-01-18 DIAGNOSIS — I480 Paroxysmal atrial fibrillation: Secondary | ICD-10-CM | POA: Diagnosis not present

## 2017-01-18 DIAGNOSIS — I080 Rheumatic disorders of both mitral and aortic valves: Secondary | ICD-10-CM

## 2017-01-18 DIAGNOSIS — I38 Endocarditis, valve unspecified: Secondary | ICD-10-CM

## 2017-01-18 DIAGNOSIS — I2510 Atherosclerotic heart disease of native coronary artery without angina pectoris: Secondary | ICD-10-CM | POA: Diagnosis not present

## 2017-01-18 DIAGNOSIS — I10 Essential (primary) hypertension: Secondary | ICD-10-CM | POA: Diagnosis not present

## 2017-01-18 DIAGNOSIS — I5032 Chronic diastolic (congestive) heart failure: Secondary | ICD-10-CM | POA: Diagnosis not present

## 2017-01-18 DIAGNOSIS — I495 Sick sinus syndrome: Secondary | ICD-10-CM

## 2017-01-18 NOTE — Progress Notes (Signed)
PCP: Shirline FreesNafziger, [name], NP  Clinic Note: Chief Complaint  Patient presents with  . Follow-up    Pt. c/o swelling in left leg  . Coronary Artery Disease  . Atrial Fibrillation    With tachybradycardia syndrome    HPI: Deborah [name] is a 83 y.o. female who is being seen today for Follow-up evaluation of atrial fibrillation, bradycardia and CAD with severe MR and diastolic CHF.   I saw her last back in January 2018. She then has been seen by [employer] Sieler,NP for follow-up evaluation of sinus pauses. These are usually nocturnal and without symptoms. She really wanted to avoid pacing. Discontinue digoxin following ILR reports.  - No significant A. fib burden. Unable to rate control. Declined DOAC for the third time.  Deborah [name] was last seen on 10/03/2016 - no major issues. Had not had any recurrent issues of rapid A. fib rate also not having the fatigue and lack of energy since stopping the digoxin.  Recent Hospitalizations: None  Studies Personally Reviewed - if available, images/films reviewed: From Epic Chart or Care Everywhere:  2-D echo December 2017: EF 60-65%. GR 2 DD. No R WMA. Moderate AI. Moderate MR. Severe LA dilation. Mild pulmonary hypertension  Interval History: Deborah [name] presents today overall doing about a month ago she had an episode of feeling her heart rate go up, but didn't last long and no one said anything about her monitor. She otherwise has not been having any major problems. No more the lightheadedness and fatigue but she was having since we stopped the digoxin. Mostly she doesn't feel the "spacing headed feeling " that she was noticing before. . Mild swelling on her left leg but nothing overly significant.  Otherwise she has been doing well for cardiac stent with no major complaints. Cardiovascular review of symptoms as follows: No chest pain or shortness of breath with rest or exertion.  No PND, orthopnea or edema.  No lightheadedness, dizziness, weakness or  syncope/near syncope. No TIA/amaurosis fugax symptoms. No claudication.  ROS: A comprehensive was performed. Pertinent positives and negatives noted above. Review of Systems  Constitutional: Negative for malaise/fatigue.  HENT: Negative for congestion and nosebleeds.   Respiratory: Negative for shortness of breath.   Gastrointestinal: Negative for blood in stool and melena.  Genitourinary: Negative for hematuria.  Neurological: Negative for dizziness.  Psychiatric/Behavioral: The patient is not nervous/anxious.   All other systems reviewed and are negative.  I have reviewed and (if needed) personally updated the patient's problem list, medications, allergies, past medical and surgical history, social and family history.   Past Medical History:  Diagnosis Date  . Anxiety   . Bradycardia   . CAD S/P percutaneous coronary angioplasty 11/2014   DES PCI to RCA. Cath was done for pre-operative evaluation for mitral repair; LM 30%, LAD 50%, RCA 90% (Promus DES)  . 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 Normal EF by echo.  . Diverticulitis   . Essential hypertension   . H/O: upper GI bleed 12/03/2014   While on ASA/Plavix & Warfarin --> Hct dropped to 18%; small AVM noted but no overt pathology.  [name]. History of uterine cancer 2001   Status post hysterectomy  . Hyperlipidemia with target LDL less than 70    Coronary disease and thoracic aortic atheroma  . Hypothyroidism    On Levothyroxine  . Paroxysmal atrial fibrillation (HCC) 2014   Associated with sick sinus syndrome. -- Intolerant of  beta blockers and diltiazem. Rate control with digoxin. Not on anticoagulation despite CHA2DS2Vasc 6  2/2 prior severe GI bleed on triple therapy  . PFO (patent foramen ovale)    Noted on TEE - L-R shunt (elsewhere reported it as a ASD)  . Severe mitral regurgitation by prior echocardiogram 09/2014   Seen on TEE to have severe MR with  multiple jets. Vena contracted 0.8; Consideration had been ? Mitral E-Clip - put on hold after GI Bleed.  . Sick sinus syndrome (HCC)    Status post Loop Recorder: [employer] Linq -- most recent interrogation 05/19/2016: 2 new pauses. One greater than 3 seconds at 0 320 the morning. Second was 4.4 seconds at 9:56 PM; also noted to have an episode of A. fib. --> Recommended further follow-up upon arrival to [facility].  . Thoracic aortic atherosclerosis (HCC) 09/2014   Moderate grade 3-4 atheroma noted on TEE  . Urinary tract infection   . Vasovagal syncope    Exacerbated by bradycardia, sick sinus syndrome, and mitral regurgitation.  . Vertigo, central, unspecified laterality    Chronic dizziness.    Past Surgical History:  Procedure Laterality Date  . ABDOMINAL HYSTERECTOMY  2001  . Cataract Surgery  Bilateral   . COLONOSCOPY W/ BIOPSIES  2006   2 polyps noted along with diverticulitis  . CORONARY ANGIOPLASTY WITH STENT PLACEMENT  11/2014   [facility]: 90% RCA - PCI with Promus DES.  [name] implanted cardiac monitor     . LOOP RECORDER INSERTION  07/24/2013   Medtronic Linq - to evaluate SSS for symptomatic bradycardia  . Right and Left Heart Catheterization  11/2014   RHC: RAP 4, RVP 32/4, PA peak 29/7/16. LVEDP 18.  CORS: 30% LM, 50% LAD, 90% RCA --> PCI (Promus DES)  . TEE WITHOUT CARDIOVERSION  09/2014   Normal LV function. Severe MR with multiple jets. Vena contractile 0.8.  PFO with L-R shunt; moderate grade 3-4 aortic atheroma  . TONSILLECTOMY    . TRANSTHORACIC ECHOCARDIOGRAM  07/2013   Severe left atrial enlargement (volume 57 mL). Normal EF. Moderate TR. ASD. Moderate MR. No pulmonary hypertension.  . TRANSTHORACIC ECHOCARDIOGRAM  08/2014   Normal LV function with biatrial enlargement. Moderate-severe mitral and tricuspid insufficiency.    Current Meds  Medication Sig  . aspirin EC 81 MG tablet Take 81 mg by mouth daily.  . ferrous  sulfate 325 (65 FE) MG tablet Take 325 mg by mouth 2 (two) times daily with a meal.   . fluticasone (FLONASE) 50 MCG/ACT nasal spray Place 2 sprays into both nostrils daily as needed for allergies or rhinitis.   . furosemide (LASIX) 20 MG tablet Take 1 tablet (20 mg total) by mouth every other day.  . levothyroxine (SYNTHROID, LEVOTHROID) 75 MCG tablet Take 1 tablet (75 mcg total) by mouth daily before breakfast.  . losartan (COZAAR) 25 MG tablet Take 1 tablet (25 mg total) by mouth daily.  . meclizine (ANTIVERT) 25 MG tablet Take 25 mg by mouth 3 (three) times daily as needed for dizziness.  [name] omeprazole (PRILOSEC) 20 MG capsule Take 1 capsule (20 mg total) by mouth daily.  . Polyethylene Glycol 3350 (MIRALAX PO) Take 1/2 dose by mouth daily  . potassium chloride (K-DUR,KLOR-CON) 10 MEQ tablet Take 1 tablet (10 mEq total) by mouth 2 (two) times daily.    Allergies  Allergen Reactions  . Carvedilol Other (See Comments)    Bradycardia   . Diltiazem Other (See  Comments)    Bradycardia  . Lopressor [Metoprolol] Other (See Comments)    Bradycardia    Social History   Social History  . Marital status: Married    Spouse name: N/A  . Number of children: 2  . Years of education: N/A   Occupational History  .      Retired Passenger transport manager   Social History Main Topics  . Smoking status: Former Smoker    Packs/day: 0.50    Years: 5.00    Types: Cigarettes    Quit date: 08/02/1970  . Smokeless tobacco: Never Used  . Alcohol use Yes     Comment: "wine occasionally"  . Drug use: No  . Sexual activity: Not Asked   Other Topics Concern  . None   Social History Narrative   Married for 59 years    Has a son, daughter died of breast cancer   One grandson    Moved from [place] -( Lives there from May- October) to be closer to their son & only remaining family.    family history includes Cancer in her brother and daughter; Diabetes in her brother; Stomach cancer in her father.  Wt  Readings from Last 3 Encounters:  01/18/17 122 lb 6.4 oz (55.5 kg)  01/03/17 121 lb (54.9 kg)  10/03/16 119 lb 9.6 oz (54.3 kg)    PHYSICAL EXAM BP 116/74   Pulse (!) 56   Ht 5\' 5"  (1.651 m)   Wt 122 lb 6.4 oz (55.5 kg)   LMP  (LMP Unknown)   BMI 20.37 kg/m  General appearance: alert, cooperative, appears stated age, no distress and Well-nourished, well-groomed. Neck: no adenopathy, no carotid bruit and no JVD Lungs: clear to auscultation bilaterally, normal percussion bilaterally and non-labored Heart: RRR. Normal S1 and S2. No R/G. Soft 1/6 SEM at apex and soft 1/6 HSM at the apex. Abdomen: soft, non-tender; bowel sounds normal; no masses,  no organomegaly; no HJR MSK/Extremities: extremities normal, atraumatic, no cyanosis, and edema - trivial. DJD of hands. Thoracic kyphosis Pulses: 2+ and symmetric;  Skin: Stable mild varicosities and spider veins/telangiectasia. Otherwise no lesions or wounds. Neurologic: Mental status: Alert, oriented, thought content appropriate. Grossly normal. Normal mood and affect.   Adult ECG Report -NA  Other studies Reviewed: Additional studies/ records that were reviewed today include:  Recent Labs:    Lab Results  Component Value Date   CHOL 163 08/22/2016   HDL 40 (L) 08/22/2016   LDLCALC 86 08/22/2016   TRIG 186 (H) 08/22/2016   CHOLHDL 4.1 08/22/2016    ASSESSMENT / PLAN: Problem List Items Addressed This Visit    Chronic diastolic heart failure due to valvular disease (HCC) (Chronic)    Probably as much related to her valvular heart disease than anything else.  For the most part, her swelling is controlled with low-dose Lasix. I recommended that she continue to use a support stockings as often as possible, at least that she see me on her feet all day or traveling.      Relevant Orders   Comprehensive metabolic panel   ECHOCARDIOGRAM COMPLETE   Coronary artery disease involving native heart without angina pectoris - Primary  (Chronic)    Interestingly, she never really had any angina. She had a cardiac catheterization done as part of preop for her valvular disease. She ended up not going for valve surgery but had PCI done. Continues to be relatively asymptomatic. I would not choose to test unless something changes from a  symptom standpoint. She is no longer on this antiplatelet agent beyond aspirin. Partly because of bleeding issues we have just decided to stay with that plan. She is on low-dose ARB and statin. Not on beta blocker secondary to her bradycardia      Relevant Orders   Comprehensive metabolic panel   ECHOCARDIOGRAM COMPLETE   Essential hypertension (Chronic)    Has labile pressures. I would not titrate medications for that and she currently is on.      Hyperlipidemia with target LDL less than 70 (Chronic)    With combination of coronary disease thoracic aortic atheroma, her lipid management is important in order to achieve LDL as low as possible. She is on low-dose simvastatin. Has not had labs in a while. Plan will be to go and check in November. With plans to be seen in follow-up in December.      Relevant Orders   Lipid panel   Comprehensive metabolic panel   Mitral regurgitation and aortic stenosis (Chronic)    Most recent echo showed only moderate MR noticing AS. Mild/moderate AI as well. I suspect that her mild mitral regurgitation improved as a result of the RCA PCI would no longer having ischemic MR.  Given the fact there is been some notable changes from [facility] to better on follow-up echo, I would like to recheck another one this summer to see how much her valve disease progresses. Main treatment regimen would be excellent reduction and she is on losartan with well-controlled pressures. On Lasix for volume control.      Relevant Orders   Comprehensive metabolic panel   ECHOCARDIOGRAM COMPLETE   Paroxysmal atrial fibrillation (HCC): CHA2DS2Vasc = 6; Not currently on AC. (Chronic)     Essentially stable on current regimen with no rate control agent. Also per her request, she is not on any correlation. At this time no plans for pacemaker unless her bradycardia gets persistently worse.      SSS (sick sinus syndrome) (HCC) (Chronic)    I think her loop recorder is pretty much out of battery now. At present, pauses noticing long enough to warrant pacemaker at this point based on her now being off digoxin and feeling better. With a relatively slow resting heart rate, she she is not even on standing rate control agent..         Current medicines are reviewed at length with the patient today. (+/- concerns) None The following changes have been made: None  Patient Instructions  Schedule in NOV 2018 at [address] Your physician has requested that you have an echocardiogram. Echocardiography is a painless test that uses sound waves to create images of your heart. It provides your doctor with information about the size and shape of your heart and how well your heart's chambers and valves are working. This procedure takes approximately one hour. There are no restrictions for this procedure.   Labs in NOV 2018 CMP LIPIDS WILL MAIL LAB SLIP TO YOU DO NOT EAT OR DRINK THE MORNING OF THE TEST     Your physician wants you to follow-up in DEC 2018 WITH DR HARDING.You will receive a reminder letter in the mail two months in advance. If you don't receive a letter, please call our office to schedule the follow-up appointment.     Studies Ordered:   Orders Placed This Encounter  Procedures  . Lipid panel  . Comprehensive metabolic panel  . ECHOCARDIOGRAM COMPLETE      Bryan Lemma,  M.D., M.S. Interventional Cardiologist   Pager # [phone] Phone # [phone] [address] [place], [place] [address]

## 2017-01-18 NOTE — Patient Instructions (Signed)
Schedule in NOV 2018 at [address] Your physician has requested that you have an echocardiogram. Echocardiography is a painless test that uses sound waves to create images of your heart. It provides your doctor with information about the size and shape of your heart and how well your heart's chambers and valves are working. This procedure takes approximately one hour. There are no restrictions for this procedure.   Labs in NOV 2018 CMP LIPIDS WILL MAIL LAB SLIP TO YOU DO NOT EAT OR DRINK THE MORNING OF THE TEST     Your physician wants you to follow-up in DEC 2018 WITH DR HARDING.You will receive a reminder letter in the mail two months in advance. If you don't receive a letter, please call our office to schedule the follow-up appointment.

## 2017-01-22 ENCOUNTER — Encounter: Payer: Self-pay | Admitting: Cardiology

## 2017-01-22 NOTE — Assessment & Plan Note (Signed)
With combination of coronary disease thoracic aortic atheroma, her lipid management is important in order to achieve LDL as low as possible. She is on low-dose simvastatin. Has not had labs in a while. Plan will be to go and check in November. With plans to be seen in follow-up in December.

## 2017-01-22 NOTE — Assessment & Plan Note (Signed)
Essentially stable on current regimen with no rate control agent. Also per her request, she is not on any correlation. At this time no plans for pacemaker unless her bradycardia gets persistently worse.

## 2017-01-22 NOTE — Assessment & Plan Note (Signed)
Has labile pressures. I would not titrate medications for that and she currently is on.

## 2017-01-22 NOTE — Assessment & Plan Note (Signed)
I think her loop recorder is pretty much out of battery now. At present, pauses noticing long enough to warrant pacemaker at this point based on her now being off digoxin and feeling better. With a relatively slow resting heart rate, she she is not even on standing rate control agent..[name]

## 2017-01-22 NOTE — Assessment & Plan Note (Signed)
Probably as much related to her valvular heart disease than anything else.  For the most part, her swelling is controlled with low-dose Lasix. I recommended that she continue to use a support stockings as often as possible, at least that she see me on her feet all day or traveling.

## 2017-01-22 NOTE — Assessment & Plan Note (Signed)
Interestingly, she never really had any angina. She had a cardiac catheterization done as part of preop for her valvular disease. She ended up not going for valve surgery but had PCI done. Continues to be relatively asymptomatic. I would not choose to test unless something changes from a symptom standpoint. She is no longer on this antiplatelet agent beyond aspirin. Partly because of bleeding issues we have just decided to stay with that plan. She is on low-dose ARB and statin. Not on beta blocker secondary to her bradycardia

## 2017-01-22 NOTE — Assessment & Plan Note (Addendum)
Most recent echo showed only moderate MR noticing AS. Mild/moderate AI as well. I suspect that her mild mitral regurgitation improved as a result of the RCA PCI would no longer having ischemic MR.  Given the fact [facility] is [facility] on follow-up echo, I would like to recheck another one this summer to see how much her valve disease progresses. Main treatment regimen would be excellent reduction and she is on losartan with well-controlled pressures. On Lasix for volume control.

## 2017-04-11 DIAGNOSIS — S335XXA Sprain of ligaments of lumbar spine, initial encounter: Secondary | ICD-10-CM | POA: Diagnosis not present

## 2017-04-11 DIAGNOSIS — M9903 Segmental and somatic dysfunction of lumbar region: Secondary | ICD-10-CM | POA: Diagnosis not present

## 2017-04-11 DIAGNOSIS — M47816 Spondylosis without myelopathy or radiculopathy, lumbar region: Secondary | ICD-10-CM | POA: Diagnosis not present

## 2017-04-11 DIAGNOSIS — M9904 Segmental and somatic dysfunction of sacral region: Secondary | ICD-10-CM | POA: Diagnosis not present

## 2017-04-13 DIAGNOSIS — M9903 Segmental and somatic dysfunction of lumbar region: Secondary | ICD-10-CM | POA: Diagnosis not present

## 2017-04-13 DIAGNOSIS — M47816 Spondylosis without myelopathy or radiculopathy, lumbar region: Secondary | ICD-10-CM | POA: Diagnosis not present

## 2017-04-13 DIAGNOSIS — S335XXA Sprain of ligaments of lumbar spine, initial encounter: Secondary | ICD-10-CM | POA: Diagnosis not present

## 2017-04-13 DIAGNOSIS — M9904 Segmental and somatic dysfunction of sacral region: Secondary | ICD-10-CM | POA: Diagnosis not present

## 2017-04-16 DIAGNOSIS — M9904 Segmental and somatic dysfunction of sacral region: Secondary | ICD-10-CM | POA: Diagnosis not present

## 2017-04-16 DIAGNOSIS — S335XXA Sprain of ligaments of lumbar spine, initial encounter: Secondary | ICD-10-CM | POA: Diagnosis not present

## 2017-04-16 DIAGNOSIS — M9903 Segmental and somatic dysfunction of lumbar region: Secondary | ICD-10-CM | POA: Diagnosis not present

## 2017-04-16 DIAGNOSIS — M47816 Spondylosis without myelopathy or radiculopathy, lumbar region: Secondary | ICD-10-CM | POA: Diagnosis not present

## 2017-04-18 DIAGNOSIS — M9904 Segmental and somatic dysfunction of sacral region: Secondary | ICD-10-CM | POA: Diagnosis not present

## 2017-04-18 DIAGNOSIS — M9903 Segmental and somatic dysfunction of lumbar region: Secondary | ICD-10-CM | POA: Diagnosis not present

## 2017-04-18 DIAGNOSIS — S335XXA Sprain of ligaments of lumbar spine, initial encounter: Secondary | ICD-10-CM | POA: Diagnosis not present

## 2017-04-18 DIAGNOSIS — M47816 Spondylosis without myelopathy or radiculopathy, lumbar region: Secondary | ICD-10-CM | POA: Diagnosis not present

## 2017-04-20 DIAGNOSIS — M9904 Segmental and somatic dysfunction of sacral region: Secondary | ICD-10-CM | POA: Diagnosis not present

## 2017-04-20 DIAGNOSIS — M47816 Spondylosis without myelopathy or radiculopathy, lumbar region: Secondary | ICD-10-CM | POA: Diagnosis not present

## 2017-04-20 DIAGNOSIS — M9903 Segmental and somatic dysfunction of lumbar region: Secondary | ICD-10-CM | POA: Diagnosis not present

## 2017-04-20 DIAGNOSIS — S335XXA Sprain of ligaments of lumbar spine, initial encounter: Secondary | ICD-10-CM | POA: Diagnosis not present

## 2017-04-26 DIAGNOSIS — S335XXA Sprain of ligaments of lumbar spine, initial encounter: Secondary | ICD-10-CM | POA: Diagnosis not present

## 2017-04-26 DIAGNOSIS — M9903 Segmental and somatic dysfunction of lumbar region: Secondary | ICD-10-CM | POA: Diagnosis not present

## 2017-04-26 DIAGNOSIS — M47816 Spondylosis without myelopathy or radiculopathy, lumbar region: Secondary | ICD-10-CM | POA: Diagnosis not present

## 2017-04-26 DIAGNOSIS — M9904 Segmental and somatic dysfunction of sacral region: Secondary | ICD-10-CM | POA: Diagnosis not present

## 2017-05-01 DIAGNOSIS — S335XXA Sprain of ligaments of lumbar spine, initial encounter: Secondary | ICD-10-CM | POA: Diagnosis not present

## 2017-05-01 DIAGNOSIS — M47816 Spondylosis without myelopathy or radiculopathy, lumbar region: Secondary | ICD-10-CM | POA: Diagnosis not present

## 2017-05-01 DIAGNOSIS — M9903 Segmental and somatic dysfunction of lumbar region: Secondary | ICD-10-CM | POA: Diagnosis not present

## 2017-05-01 DIAGNOSIS — M9904 Segmental and somatic dysfunction of sacral region: Secondary | ICD-10-CM | POA: Diagnosis not present

## 2017-05-25 DIAGNOSIS — N10 Acute tubulo-interstitial nephritis: Secondary | ICD-10-CM | POA: Diagnosis not present

## 2017-05-26 DIAGNOSIS — N10 Acute tubulo-interstitial nephritis: Secondary | ICD-10-CM | POA: Diagnosis not present

## 2017-05-26 DIAGNOSIS — M545 Low back pain: Secondary | ICD-10-CM | POA: Diagnosis not present

## 2017-05-26 DIAGNOSIS — M818 Other osteoporosis without current pathological fracture: Secondary | ICD-10-CM | POA: Diagnosis not present

## 2017-06-08 ENCOUNTER — Encounter: Payer: Self-pay | Admitting: Adult Health

## 2017-06-16 ENCOUNTER — Other Ambulatory Visit: Payer: Self-pay | Admitting: Adult Health

## 2017-06-19 NOTE — Telephone Encounter (Signed)
Left a message for a return call.  Pt needs to be scheduled for yearly on or after 07/13/17

## 2017-06-21 ENCOUNTER — Encounter: Payer: Self-pay | Admitting: Family Medicine

## 2017-06-21 NOTE — Telephone Encounter (Signed)
Sent to the pharmacy by e-scribe for 90 days.  Will send a letter to the pt to have her get on the scheduled for yearly visit.

## 2017-07-02 ENCOUNTER — Encounter: Payer: Self-pay | Admitting: Adult Health

## 2017-07-03 ENCOUNTER — Emergency Department (HOSPITAL_COMMUNITY)
Admission: EM | Admit: 2017-07-03 | Discharge: 2017-07-03 | Disposition: A | Discharge status: Home / Self Care | Payer: Medicare HMO | Attending: Emergency Medicine | Admitting: Emergency Medicine

## 2017-07-03 ENCOUNTER — Encounter (HOSPITAL_COMMUNITY): Payer: Self-pay | Admitting: Emergency Medicine

## 2017-07-03 ENCOUNTER — Emergency Department (HOSPITAL_COMMUNITY): Payer: Medicare HMO

## 2017-07-03 DIAGNOSIS — R251 Tremor, unspecified: Secondary | ICD-10-CM

## 2017-07-03 DIAGNOSIS — Z87891 Personal history of nicotine dependence: Secondary | ICD-10-CM | POA: Insufficient documentation

## 2017-07-03 DIAGNOSIS — R42 Dizziness and giddiness: Secondary | ICD-10-CM | POA: Diagnosis not present

## 2017-07-03 DIAGNOSIS — R0602 Shortness of breath: Secondary | ICD-10-CM | POA: Diagnosis not present

## 2017-07-03 DIAGNOSIS — Z79899 Other long term (current) drug therapy: Secondary | ICD-10-CM | POA: Insufficient documentation

## 2017-07-03 DIAGNOSIS — E039 Hypothyroidism, unspecified: Secondary | ICD-10-CM | POA: Insufficient documentation

## 2017-07-03 DIAGNOSIS — Z955 Presence of coronary angioplasty implant and graft: Secondary | ICD-10-CM | POA: Diagnosis not present

## 2017-07-03 DIAGNOSIS — I5032 Chronic diastolic (congestive) heart failure: Secondary | ICD-10-CM | POA: Insufficient documentation

## 2017-07-03 DIAGNOSIS — R55 Syncope and collapse: Secondary | ICD-10-CM

## 2017-07-03 DIAGNOSIS — I10 Essential (primary) hypertension: Secondary | ICD-10-CM | POA: Diagnosis not present

## 2017-07-03 DIAGNOSIS — I110 Hypertensive heart disease with heart failure: Secondary | ICD-10-CM | POA: Diagnosis not present

## 2017-07-03 DIAGNOSIS — I2510 Atherosclerotic heart disease of native coronary artery without angina pectoris: Secondary | ICD-10-CM | POA: Diagnosis not present

## 2017-07-03 DIAGNOSIS — Z7982 Long term (current) use of aspirin: Secondary | ICD-10-CM | POA: Diagnosis not present

## 2017-07-03 DIAGNOSIS — I495 Sick sinus syndrome: Secondary | ICD-10-CM | POA: Diagnosis not present

## 2017-07-03 DIAGNOSIS — R404 Transient alteration of awareness: Secondary | ICD-10-CM | POA: Diagnosis not present

## 2017-07-03 DIAGNOSIS — I5031 Acute diastolic (congestive) heart failure: Secondary | ICD-10-CM | POA: Diagnosis not present

## 2017-07-03 LAB — COMPREHENSIVE METABOLIC PANEL
ALBUMIN: 3.6 g/dL (ref 3.5–5.0)
ALT: 23 U/L (ref 14–54)
ANION GAP: 9 (ref 5–15)
AST: 23 U/L (ref 15–41)
Alkaline Phosphatase: 70 U/L (ref 38–126)
BUN: 17 mg/dL (ref 6–20)
CO2: 24 mmol/L (ref 22–32)
Calcium: 8.9 mg/dL (ref 8.9–10.3)
Chloride: 99 mmol/L — ABNORMAL LOW (ref 101–111)
Creatinine, Ser: 0.81 mg/dL (ref 0.44–1.00)
GFR calc Af Amer: >60 mL/min (ref >60)
GFR calc non Af Amer: >60 mL/min (ref >60)
GLUCOSE: 104 mg/dL — AB (ref 65–99)
POTASSIUM: 4.2 mmol/L (ref 3.5–5.1)
Sodium: 132 mmol/L — ABNORMAL LOW (ref 135–145)
TOTAL PROTEIN: 6.4 g/dL — AB (ref 6.5–8.1)
Total Bilirubin: 0.5 mg/dL (ref 0.3–1.2)

## 2017-07-03 LAB — URINALYSIS, ROUTINE W REFLEX MICROSCOPIC
BACTERIA UA: NONE SEEN
Bilirubin Urine: NEGATIVE
Glucose, UA: NEGATIVE mg/dL
Hgb urine dipstick: NEGATIVE
KETONES UR: NEGATIVE mg/dL
Nitrite: NEGATIVE
PROTEIN: NEGATIVE mg/dL
SQUAMOUS EPITHELIAL / LPF: NONE SEEN
Specific Gravity, Urine: 1.005 (ref 1.005–1.030)
pH: 7 (ref 5.0–8.0)

## 2017-07-03 LAB — TROPONIN I

## 2017-07-03 LAB — PROCALCITONIN: Procalcitonin: <0.1 ng/mL

## 2017-07-03 LAB — CBC WITH DIFFERENTIAL/PLATELET
BASOS ABS: 0.1 10*3/uL (ref 0.0–0.1)
BASOS PCT: 1 %
EOS ABS: 0.2 10*3/uL (ref 0.0–0.7)
Eosinophils Relative: 4 %
HCT: 35.5 % — ABNORMAL LOW (ref 36.0–46.0)
HEMOGLOBIN: 11.7 g/dL — AB (ref 12.0–15.0)
Lymphocytes Relative: 17 %
Lymphs Abs: 0.8 10*3/uL (ref 0.7–4.0)
MCH: 32.1 pg (ref 26.0–34.0)
MCHC: 33 g/dL (ref 30.0–36.0)
MCV: 97.5 fL (ref 78.0–100.0)
MONO ABS: 0.6 10*3/uL (ref 0.1–1.0)
MONOS PCT: 13 %
NEUTROS ABS: 2.9 10*3/uL (ref 1.7–7.7)
NEUTROS PCT: 65 %
Platelets: 348 10*3/uL (ref 150–400)
RBC: 3.64 MIL/uL — ABNORMAL LOW (ref 3.87–5.11)
RDW: 13.7 % (ref 11.5–15.5)
WBC: 4.5 10*3/uL (ref 4.0–10.5)

## 2017-07-03 LAB — TSH: TSH: 8.32 u[IU]/mL — AB (ref 0.350–4.500)

## 2017-07-03 LAB — BRAIN NATRIURETIC PEPTIDE: B NATRIURETIC PEPTIDE 5: 220.7 pg/mL — AB (ref 0.0–100.0)

## 2017-07-03 LAB — CBG MONITORING, ED: GLUCOSE-CAPILLARY: 88 mg/dL (ref 65–99)

## 2017-07-03 MED ORDER — ACETAMINOPHEN 325 MG PO TABS
650.0000 mg | ORAL_TABLET | Freq: Once | ORAL | Status: AC
  Administered 2017-07-03: 650 mg via ORAL
  Filled 2017-07-03: qty 2

## 2017-07-03 MED ORDER — FUROSEMIDE 10 MG/ML IJ SOLN
20.0000 mg | Freq: Once | INTRAMUSCULAR | Status: AC
  Administered 2017-07-03: 20 mg via INTRAVENOUS
  Filled 2017-07-03: qty 2

## 2017-07-03 MED ORDER — SODIUM CHLORIDE 0.9 % IV BOLUS (SEPSIS)
500.0000 mL | Freq: Once | INTRAVENOUS | Status: AC
  Administered 2017-07-03: 500 mL via INTRAVENOUS

## 2017-07-03 MED ORDER — LORAZEPAM 2 MG/ML IJ SOLN
0.5000 mg | Freq: Once | INTRAMUSCULAR | Status: AC
  Administered 2017-07-03: 0.5 mg via INTRAVENOUS
  Filled 2017-07-03: qty 1

## 2017-07-03 MED ORDER — ALPRAZOLAM 0.25 MG PO TABS: 0.2500 mg | ORAL_TABLET | Freq: Every evening | ORAL | 0 refills | Status: DC | PRN

## 2017-07-03 MED ORDER — FUROSEMIDE 20 MG PO TABS: 20.0000 mg | ORAL_TABLET | Freq: Every day | ORAL | 0 refills | Status: DC

## 2017-07-03 NOTE — ED Provider Notes (Signed)
Emergency Department Provider Note   I have reviewed the triage vital signs and the nursing notes.   HISTORY  Chief Complaint Near Syncope   HPI Deborah [name] is a 83 y.o. female with PMH of CAD, dCHF with known mitral valve disease, HTN, HLD, and PAF presents to the emergency department for evaluation of shaking episodes with associated near syncope speech problems. Patient states that she awoke this morning needing to urinate when she noticed total body shaking. She had difficulty walking to the bathroom because of her shaking. Denies unilateral weakness or numbness. No vision changes or voice changes. She did note was difficult to talk because of the tremors. She denies any severe headache. No similar symptoms in the past. Upon standing she reports feeling very lightheaded like she may pass out but denies palpitations, chest pain, shortness of breath. Patient denies any similar symptoms in the past. No associated fevers. No falls or head trauma. No new medications or dose changes to her current medications. No radiation of symptoms.   Patient has been experiencing some chronic lower back pain has been taking Tylenol frequently for this issue but the husband states he's been giving the medication as instructed.    Past Medical History:  Diagnosis Date  . Anxiety   . Bradycardia   . CAD S/P percutaneous coronary angioplasty 11/2014   DES PCI to RCA. Cath was done for pre-operative evaluation for mitral repair; LM 30%, LAD 50%, RCA 90% (Promus DES)  . 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 Normal EF by echo.  . Diverticulitis   . Essential hypertension   . H/O: upper GI bleed 12/03/2014   While on ASA/Plavix & Warfarin --> Hct dropped to 18%; small AVM noted but no overt pathology.  [name] History of uterine cancer 2001   Status post hysterectomy  . Hyperlipidemia with target LDL less than 70    Coronary disease and  thoracic aortic atheroma  . Hypothyroidism    On Levothyroxine  . Paroxysmal atrial fibrillation (HCC) 2014   Associated with sick sinus syndrome. -- Intolerant of beta blockers and diltiazem. Rate control with digoxin. Not on anticoagulation despite CHA2DS2Vasc 6  2/2 prior severe GI bleed on triple therapy  . PFO (patent foramen ovale)    Noted on TEE - L-R shunt (elsewhere reported it as a ASD)  . Severe mitral regurgitation by prior echocardiogram 09/2014   Seen on TEE to have severe MR with multiple jets. Vena contracted 0.8; Consideration had been ? Mitral E-Clip - put on hold after GI Bleed.  . Sick sinus syndrome (HCC)    Status post Loop Recorder: [employer] Linq -- most recent interrogation 05/19/2016: 2 new pauses. One greater than 3 seconds at 0 320 the morning. Second was 4.4 seconds at 9:56 PM; also noted to have an episode of A. fib. --> Recommended further follow-up upon arrival to [facility].  . Thoracic aortic atherosclerosis (HCC) 09/2014   Moderate grade 3-4 atheroma noted on TEE  . Urinary tract infection   . Vasovagal syncope    Exacerbated by bradycardia, sick sinus syndrome, and mitral regurgitation.  . Vertigo, central, unspecified laterality    Chronic dizziness.    Patient Active Problem List   Diagnosis Date Noted  . Acute diastolic CHF (congestive heart failure) (HCC) 07/03/2017  . Tremor 07/03/2017  . Near syncope 07/03/2017  . Vasovagal syncope   . Hyperlipidemia with target LDL less than  70   . SSS (sick sinus syndrome) (HCC) 07/31/2016  . CAD S/P DES PCI to RCA 07/31/2016  . Anemia 07/31/2016  . Essential hypertension 07/13/2016  . Hypothyroidism 07/13/2016  . Coronary artery disease involving native heart without angina pectoris 12/02/2014  . Mitral regurgitation and aortic stenosis 09/30/2014  . Thoracic aortic atherosclerosis (HCC) 09/18/2014  . Chronic diastolic heart failure due to valvular disease (HCC) 09/18/2013  . Paroxysmal atrial  fibrillation (HCC): CHA2DS2Vasc = 6; Not currently on [facility]. 09/18/2012    Past Surgical History:  Procedure Laterality Date  . ABDOMINAL HYSTERECTOMY  2001  . Cataract Surgery  Bilateral   . COLONOSCOPY W/ BIOPSIES  2006   2 polyps noted along with diverticulitis  . CORONARY ANGIOPLASTY WITH STENT PLACEMENT  11/2014   [facility]: 90% RCA - PCI with Promus DES.  [name] implanted cardiac monitor     . LOOP RECORDER INSERTION  07/24/2013   Medtronic Linq - to evaluate SSS for symptomatic bradycardia  . Right and Left Heart Catheterization  11/2014   RHC: RAP 4, RVP 32/4, PA peak 29/7/16. LVEDP 18.  CORS: 30% LM, 50% LAD, 90% RCA --> PCI (Promus DES)  . TEE WITHOUT CARDIOVERSION  09/2014   Normal LV function. Severe MR with multiple jets. Vena contractile 0.8.  PFO with L-R shunt; moderate grade 3-4 aortic atheroma  . TONSILLECTOMY    . TRANSTHORACIC ECHOCARDIOGRAM  07/2013   Severe left atrial enlargement (volume 57 mL). Normal EF. Moderate TR. ASD. Moderate MR. No pulmonary hypertension.  . TRANSTHORACIC ECHOCARDIOGRAM  08/2014   Normal LV function with biatrial enlargement. Moderate-severe mitral and tricuspid insufficiency.    Current Outpatient Rx  . Order #: [id number] Class: Historical Med  . Order #: [id number] Class: Historical Med  . Order #: [id number] Class: Historical Med  . Order #: [id number] Class: Historical Med  . Order #: [id number] Class: Normal  . Order #: [id number] Class: Normal  . Order #: [id number] Class: Normal  . Order #: [id number] Class: Historical Med  . Order #: [id number] Class: Normal  . Order #: [id number] Class: Historical Med  . Order #: [id number] Class: Normal  . Order #: [id number] Class: Normal  . Order #: [id number] Class: Print  . Order #: [id number] Class: Print    Allergies Carvedilol; Diltiazem; and Lopressor [metoprolol]  Family History  Problem Relation Age of Onset  . Stomach cancer Father   . Cancer Brother   .  Diabetes Brother   . Cancer Daughter     Social History Social History  Substance Use Topics  . Smoking status: Former Smoker    Packs/day: 0.50    Years: 5.00    Types: Cigarettes    Quit date: 08/02/1970  . Smokeless tobacco: Never Used  . Alcohol use Yes     Comment: "wine occasionally"    Review of Systems  Constitutional: No fever/chills. Positive tremors.  Eyes: No visual changes. ENT: No sore throat. Cardiovascular: Denies chest pain. Positive near syncope.  Respiratory: Denies shortness of breath. Gastrointestinal: No abdominal pain.  No nausea, no vomiting.  No diarrhea.  No constipation. Genitourinary: Negative for dysuria. Musculoskeletal: Negative for back pain. Skin: Negative for rash. Neurological: Negative for headaches, focal weakness or numbness.  10-point ROS otherwise negative.  ____________________________________________   PHYSICAL EXAM:  VITAL SIGNS: ED Triage Vitals  Enc Vitals Group     BP 07/03/17 0634 126/82     Pulse Rate 07/03/17 0634 75     Resp 07/03/17 0634 12     Temp 07/03/17  0634 97.7 F (36.5 C)     Temp Source 07/03/17 0634 Oral     SpO2 07/03/17 0631 100 %     Pain Score 07/03/17 0633 0    Constitutional: Alert and oriented. Well appearing but tremulous.  Eyes: Conjunctivae are normal. PERRL. Head: Atraumatic. Nose: No congestion/rhinnorhea. Mouth/Throat: Mucous membranes are moist.  Neck: No stridor.   Cardiovascular: Normal rate, regular rhythm. Good peripheral circulation. Grossly normal heart sounds.   Respiratory: Normal respiratory effort.  No retractions. Lungs CTAB. Gastrointestinal: Soft and nontender. No distention.  Musculoskeletal: No lower extremity tenderness nor edema. No gross deformities of extremities. Neurologic:  Normal speech and language. No gross focal neurologic deficits are appreciated. Normal CN exam 2-12.  Skin:  Skin is warm, dry and intact. No rash  noted.  ____________________________________________   LABS (all labs ordered are listed, but only abnormal results are displayed)  Labs Reviewed  URINALYSIS, ROUTINE W REFLEX MICROSCOPIC - Abnormal; Notable for the following:       Result Value   Color, Urine STRAW (*)    Leukocytes, UA TRACE (*)    All other components within normal limits  COMPREHENSIVE METABOLIC PANEL - Abnormal; Notable for the following:    Sodium 132 (*)    Chloride 99 (*)    Glucose, Bld 104 (*)    Total Protein 6.4 (*)    All other components within normal limits  CBC WITH DIFFERENTIAL/PLATELET - Abnormal; Notable for the following:    RBC 3.64 (*)    Hemoglobin 11.7 (*)    HCT 35.5 (*)    All other components within normal limits  TSH - Abnormal; Notable for the following:    TSH 8.320 (*)    All other components within normal limits  BRAIN NATRIURETIC PEPTIDE - Abnormal; Notable for the following:    B Natriuretic Peptide 220.7 (*)    All other components within normal limits  TROPONIN I  PROCALCITONIN  CBG MONITORING, ED   ____________________________________________  EKG   EKG Interpretation  Date/Time:  Tuesday July 03 2017 06:46:15 EDT Ventricular Rate:  80 PR Interval:    QRS Duration: 89 QT Interval:  424 QTC Calculation: 448 R Axis:   -26 Text Interpretation:  Unknown rhythm, irregular rate LVH by voltage Borderline ST elevation, lateral leads No STEMI.  Confirmed by Alona Bene [phone]) on 07/03/2017 7:00:33 AM       ____________________________________________  RADIOLOGY  Dg Chest 2 View  Result Date: 07/03/2017 CLINICAL DATA:  Near syncope.  Shortness of breath . EXAM: CHEST  2 VIEW COMPARISON:  No prior . FINDINGS: Borderline cardiomegaly. Diffuse bilateral pulmonary interstitial prominence and bilateral small pleural effusions. Findings suggest CHF. Pneumonitis cannot be excluded. IMPRESSION: Borderline cardiomegaly. Diffuse bilateral pulmonary interstitial  prominence and bilateral small pleural effusions suggesting mild CHF . Electronically Signed   By: Maisie Fus  Register   On: 07/03/2017 08:31    ____________________________________________   PROCEDURES  Procedure(s) performed:   Procedures  None ____________________________________________   INITIAL IMPRESSION / ASSESSMENT AND PLAN / ED COURSE  Pertinent labs & imaging results that were available during my care of the patient were reviewed by me and considered in my medical decision making (see chart for details).  Patient presents emergency department for evaluation of tremulous shaking with near-syncope and voice changes. No similar symptoms in the past. Patient notes intermittent worsening of symptoms. Review of her home medications are largely unremarkable. She does have hypothyroidism. No exam findings consistent with acquired movement  disorder. Plan for labs, IVF, and ativan with reassessment.   12:15 PM Spoke with Dr. Konrad Dolores after hospitalist consultation. Appreciate their assistance with the case. Plan for discharge home with increased lasix over the next 4 days and a small amount or low-dose Xanax for anxiety symptoms. Patient and family are pleased with the plan. Will discharge at this time.   have reviewed and discussed all results (EKG, imaging, lab, urine as appropriate), exam findings with patient. I have reviewed nursing notes and appropriate previous records.  I feel the patient is safe to be discharged home without further emergent workup. Discussed usual and customary return precautions. Patient and family (if present) verbalize understanding and are comfortable with this plan.  Patient will follow-up with their primary care provider. If they do not have a primary care provider, information for follow-up has been provided to them. All questions have been answered.  ____________________________________________  FINAL CLINICAL IMPRESSION(S) / ED DIAGNOSES  Final  diagnoses:  Near syncope  Tremor     MEDICATIONS GIVEN DURING THIS VISIT:  Medications  LORazepam (ATIVAN) injection 0.5 mg (0.5 mg Intravenous Given 07/03/17 0721)  sodium chloride 0.9 % bolus 500 mL (0 mLs Intravenous Stopped 07/03/17 0844)  furosemide (LASIX) injection 20 mg (20 mg Intravenous Given 07/03/17 0956)  acetaminophen (TYLENOL) tablet 650 mg (650 mg Oral Given 07/03/17 1233)     NEW OUTPATIENT MEDICATIONS STARTED DURING THIS VISIT:  Xanax 0.25 mg daily PRN anxiety Lasix 20 mg daily for the next 4 days and then return to normal dosing.    Note:  This document was prepared using Dragon voice recognition software and may include unintentional dictation errors.  Alona Bene, MD Emergency Medicine    Long, Arlyss Repress, MD 07/03/17 1255

## 2017-07-03 NOTE — Consult Note (Addendum)
Medical Consultation   Deborah [name]  JXB:[id number]  DOB: 1931-02-05  DOA: 07/03/2017  PCP: Shirline Frees, NP   Outpatient Specialists: cardiology   Requesting physician: Dr. Jacqulyn Bath  Reason for consultation: Near syncope   History of Present Illness: Deborah [name] is an 83 y.o. female sick sinus syndrome, CAD status post catheterization with stent placement, diverticulosis/diverticulitis, hyperlipidemia, hypothyroidism, PAF, PFO,.   Single near syncopal episode with associated tremors and difficulty speaking secondary to the tremor. Patient awoke in the middle the night to use the restroom she frequently does. Husband had to assist patient to the bathroom due to tremor. Patient denies any other symptoms at that point time. Patient was able to use the restroom but then continued to have difficulty with shaking and she went back to her bed. At no point time was her speech slurred, confused, or expressing any focal neurological deficit. At that point time patient's husband was worried and called EMS. Patient's husband took her blood pressure which was approximately 114/70 with a heart rate of 70 at that time.  Reports baseline cough with worsening ankle edema over the last couple of weeks. Patient endorses compliance with her Lasix every other day when he milligram regimen with no other recent medication changes. Denies any dysphagia to solids or liquids. Denies any fevers, chest pain, shortness of breath, abdominal pain, dysuria, frequency, neck stiffness, focal neurological deficit.  In the emergency room patient received a 500 mL bolus and Ativan with improvement in her symptoms. Resting comfortably at time of my examination. Currently states that she feels near baseline.    Review of Systems:  ROS As per HPI otherwise all other systems reviewed and are negative   Past Medical History: Past Medical History:  Diagnosis Date  . Anxiety   . Bradycardia   . CAD S/P  percutaneous coronary angioplasty 11/2014   DES PCI to RCA. Cath was done for pre-operative evaluation for mitral repair; LM 30%, LAD 50%, RCA 90% (Promus DES)  . 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 Normal EF by echo.  . Diverticulitis   . Essential hypertension   . H/O: upper GI bleed 12/03/2014   While on ASA/Plavix & Warfarin --> Hct dropped to 18%; small AVM noted but no overt pathology.  [name] History of uterine cancer 2001   Status post hysterectomy  . Hyperlipidemia with target LDL less than 70    Coronary disease and thoracic aortic atheroma  . Hypothyroidism    On Levothyroxine  . Paroxysmal atrial fibrillation (HCC) 2014   Associated with sick sinus syndrome. -- Intolerant of beta blockers and diltiazem. Rate control with digoxin. Not on anticoagulation despite CHA2DS2Vasc 6  2/2 prior severe GI bleed on triple therapy  . PFO (patent foramen ovale)    Noted on TEE - L-R shunt (elsewhere reported it as a ASD)  . Severe mitral regurgitation by prior echocardiogram 09/2014   Seen on TEE to have severe MR with multiple jets. Vena contracted 0.8; Consideration had been ? Mitral E-Clip - put on hold after GI Bleed.  . Sick sinus syndrome (HCC)    Status post Loop Recorder: [employer]tr[employer] Linq -- most recent interrogation 05/19/2016: 2 new pauses. One greater than 3 seconds at 0 320 the morning. Second was 4.4 seconds at 9:56 PM; also noted to have an episode of A. fib. --> Recommended further  follow-up upon arrival to [facility].  . Thoracic aortic atherosclerosis (HCC) 09/2014   Moderate grade 3-4 atheroma noted on TEE  . Urinary tract infection   . Vasovagal syncope    Exacerbated by bradycardia, sick sinus syndrome, and mitral regurgitation.  . Vertigo, central, unspecified laterality    Chronic dizziness.    Past Surgical History: Past Surgical History:  Procedure Laterality Date  . ABDOMINAL HYSTERECTOMY   2001  . Cataract Surgery  Bilateral   . COLONOSCOPY W/ BIOPSIES  2006   2 polyps noted along with diverticulitis  . CORONARY ANGIOPLASTY WITH STENT PLACEMENT  11/2014   [facility]: 90% RCA - PCI with Promus DES.  [name] implanted cardiac monitor     . LOOP RECORDER INSERTION  07/24/2013   Medtronic Linq - to evaluate SSS for symptomatic bradycardia  . Right and Left Heart Catheterization  11/2014   RHC: RAP 4, RVP 32/4, PA peak 29/7/16. LVEDP 18.  CORS: 30% LM, 50% LAD, 90% RCA --> PCI (Promus DES)  . TEE WITHOUT CARDIOVERSION  09/2014   Normal LV function. Severe MR with multiple jets. Vena contractile 0.8.  PFO with L-R shunt; moderate grade 3-4 aortic atheroma  . TONSILLECTOMY    . TRANSTHORACIC ECHOCARDIOGRAM  07/2013   Severe left atrial enlargement (volume 57 mL). Normal EF. Moderate TR. ASD. Moderate MR. No pulmonary hypertension.  . TRANSTHORACIC ECHOCARDIOGRAM  08/2014   Normal LV function with biatrial enlargement. Moderate-severe mitral and tricuspid insufficiency.     Allergies:   Allergies  Allergen Reactions  . Carvedilol Other (See Comments)    Bradycardia   . Diltiazem Other (See Comments)    Bradycardia  . Lopressor [Metoprolol] Other (See Comments)    Bradycardia     Social History:  reports that she quit smoking about 46 years ago. Her smoking use included Cigarettes. She has a 2.50 pack-year smoking history. She has never used smokeless tobacco. She reports that she drinks alcohol. She reports that she does not use drugs.   Family History: Family History  Problem Relation Age of Onset  . Stomach cancer Father   . Cancer Brother   . Diabetes Brother   . Cancer Daughter      Physical Exam: Vitals:   07/03/17 1124 07/03/17 1128 07/03/17 1132 07/03/17 1229  BP: (!) 127/56 131/63 (!) 144/104 112/82  Pulse: 71 65 69 69  Resp:    19  Temp:      TempSrc:      SpO2:    96%    General:  Appears calm and  comfortable Eyes:  PERRL, EOMI, normal lids, iris ENT:  grossly normal hearing, lips & tongue, mmm Neck:  no LAD, masses or thyromegaly Cardiovascular:  RRR, no m/r/g. No LE edema.  Respiratory: crackles in the bases bilaterally. Mild increased effort. Normal O2 saturations. No wheezes. Abdomen:  soft, ntnd, NABS Skin:  no rash or induration seen on limited exam Musculoskeletal:  grossly normal tone BUE/BLE, good ROM, no bony abnormality Psychiatric:  grossly normal mood and affect, speech fluent and appropriate, AOx3 Neurologic:  CN 2-12 grossly intact, moves all extremities in coordinated fashion, sensation intact  Data reviewed:  I have personally reviewed following labs and imaging studies Labs:  CBC:  Recent Labs Lab 07/03/17 0727  WBC 4.5  NEUTROABS 2.9  HGB 11.7*  HCT 35.5*  MCV 97.5  PLT 348    Basic Metabolic Panel:  Recent Labs Lab 07/03/17 0727  NA  132*  K 4.2  CL 99*  CO2 24  GLUCOSE 104*  BUN 17  CREATININE 0.81  CALCIUM 8.9   GFR CrCl cannot be calculated (Unknown ideal weight.). Liver Function Tests:  Recent Labs Lab 07/03/17 0727  AST 23  ALT 23  ALKPHOS 70  BILITOT 0.5  PROT 6.4*  ALBUMIN 3.6   No results for input(s): LIPASE, AMYLASE in the last 168 hours. No results for input(s): AMMONIA in the last 168 hours. Coagulation profile No results for input(s): INR, PROTIME in the last 168 hours.  Cardiac Enzymes:  Recent Labs Lab 07/03/17 0942  TROPONINI <0.03   BNP: Invalid input(s): POCBNP CBG:  Recent Labs Lab 07/03/17 0645  GLUCAP 88   D-Dimer No results for input(s): DDIMER in the last 72 hours. Hgb A1c No results for input(s): HGBA1C in the last 72 hours. Lipid Profile No results for input(s): CHOL, HDL, LDLCALC, TRIG, CHOLHDL, LDLDIRECT in the last 72 hours. Thyroid function studies  Recent Labs  07/03/17 0727  TSH 8.320*   Anemia work up No results for input(s): VITAMINB12, FOLATE, FERRITIN, TIBC, IRON,  RETICCTPCT in the last 72 hours. Urinalysis    Component Value Date/Time   COLORURINE STRAW (A) 07/03/2017 0729   APPEARANCEUR CLEAR 07/03/2017 0729   LABSPEC 1.005 07/03/2017 0729   PHURINE 7.0 07/03/2017 0729   GLUCOSEU NEGATIVE 07/03/2017 0729   HGBUR NEGATIVE 07/03/2017 0729   BILIRUBINUR NEGATIVE 07/03/2017 0729   KETONESUR NEGATIVE 07/03/2017 0729   PROTEINUR NEGATIVE 07/03/2017 0729   NITRITE NEGATIVE 07/03/2017 0729   LEUKOCYTESUR TRACE (A) 07/03/2017 0729     Microbiology No results found for this or any previous visit (from the past 240 hour(s)).     Inpatient Medications:   Scheduled Meds:  Continuous Infusions:   Radiological Exams on Admission: Dg Chest 2 View  Result Date: 07/03/2017 CLINICAL DATA:  Near syncope.  Shortness of breath . EXAM: CHEST  2 VIEW COMPARISON:  No prior . FINDINGS: Borderline cardiomegaly. Diffuse bilateral pulmonary interstitial prominence and bilateral small pleural effusions. Findings suggest CHF. Pneumonitis cannot be excluded. IMPRESSION: Borderline cardiomegaly. Diffuse bilateral pulmonary interstitial prominence and bilateral small pleural effusions suggesting mild CHF . Electronically Signed   By: Maisie Fus  Register   On: 07/03/2017 08:31    Impression/Recommendations Active Problems:   Essential hypertension   SSS (sick sinus syndrome) (HCC)   Acute diastolic CHF (congestive heart failure) (HCC)   Tremor   Near syncope  Near Syncope: unclear etiology. May be from SSS episode that normalized by the time pts husband checked her pulse and BP. May be related to thyroid dysfunction given elevated TSH though unlikely. T4 pending. May be related to early CHF given crackles on exam, increasing ankle edema, and CXR w/ mild cardiomegaly w/ increased pulmary vascular congestion on my read.  - Trop, BNP, procalcitonin - single Lasix  IV  - T4  Will reasess pt after labs return and response to lasix   Anticipate DC home w/  regimen for increased diuresis over the next 3-5 days of lasix  PO daily. If procalcitonin is elevated would recommend starting on Augmentin 875 BID x 7 days for early pneumonia. Additionally pt should conctact her PCP to discuss thyroid studies and increasing her levothyroxine.    ADDENDUM 12:30  Labs have returned. BNP 220.7, troponin less than 0.03, pro calcitonin less than 0.1 with normal orthostatic vital signs per nursing staff. Patient feeling well. Spent greater than 60 minutes in total updating patient, patient's  husband and patient's son at bedside. Strongly suspicious that patient's symptoms are due to mild CHF exacerbation. She is scheduled to see her PCP in 2 weeks and scheduled to have an outpatient echo performed sometime in the next 1-2 weeks. Patient has diuresed nicely since receiving 20 mg of IV Lasix in the ED. Blood pressure remains stable. Heart rate remained stable Patient's only complaint at this time as her chronic lower back pain which is at baseline. I recommended that the patient take an additional dose of Lasix this evening with her dinner and then her usual Lasix a.m. Dose every day on Wednesday Thursday and Friday I discussed at length symptoms of hypotension with the family which would be an indication to stop her Lasix. I've also instructed the patient to weigh herself daily in the mornings after she gets up and voids. Patient does not need any antibiotic coverage is this is almost certainly not an infectious process. I anticipate that some of her tremor at night came from anxiety and stress caused by relative hypoxemia at rest with fluid shifts while lying down. She felt much better after receiving a low-dose of Ativan in the ED.Patient's TSH is elevated but this should not be treated in singularity and will need follow-up labs.  EDP, Dr Jacqulyn Bath, has graciously agreed to prescribe a low-dose of Xanax, 0.25 to be used as needed for anxiety.       Thank you for this  consultation.  Our [facility] hospitalist team will follow the patient with you.   Ozella Rocks M.D. [facility] 07/03/2017, 12:35 PM

## 2017-07-03 NOTE — ED Notes (Signed)
Heart healthy breakfast tray delivered.

## 2017-07-03 NOTE — Discharge Instructions (Signed)
You were seen in the ED today with shaking episode and lightheadedness. We are starting a medication to take as needed for shaking. We also want you to start taking lasix every day for the next 4 days and then return to your normal dosing.   Follow up with your PCP and return to the ED with any new or worsening symptoms.

## 2017-07-03 NOTE — ED Notes (Signed)
Patient denies pain and is resting comfortably.  

## 2017-07-03 NOTE — ED Triage Notes (Signed)
Pt reports waking up and when trying to get out of bed she began to feel dizzy as if she was going to "pass out" she also stated her "stomach was shanking".  Per EMS pt stated she was having difficulty "finding her words."  Pt has cardiac history.

## 2017-07-13 ENCOUNTER — Ambulatory Visit (INDEPENDENT_AMBULATORY_CARE_PROVIDER_SITE_OTHER): Payer: Medicare HMO

## 2017-07-13 DIAGNOSIS — Z23 Encounter for immunization: Secondary | ICD-10-CM | POA: Diagnosis not present

## 2017-07-17 ENCOUNTER — Encounter: Payer: Self-pay | Admitting: Adult Health

## 2017-07-19 ENCOUNTER — Ambulatory Visit (INDEPENDENT_AMBULATORY_CARE_PROVIDER_SITE_OTHER): Payer: Medicare HMO | Admitting: Adult Health

## 2017-07-19 ENCOUNTER — Encounter: Payer: Self-pay | Admitting: Adult Health

## 2017-07-19 VITALS — BP 94/60 | Temp 98.2°F | Ht 60.0 in | Wt 122.0 lb

## 2017-07-19 DIAGNOSIS — Z0000 Encounter for general adult medical examination without abnormal findings: Secondary | ICD-10-CM | POA: Diagnosis not present

## 2017-07-19 DIAGNOSIS — Z9861 Coronary angioplasty status: Secondary | ICD-10-CM

## 2017-07-19 DIAGNOSIS — E039 Hypothyroidism, unspecified: Secondary | ICD-10-CM

## 2017-07-19 DIAGNOSIS — I10 Essential (primary) hypertension: Secondary | ICD-10-CM | POA: Diagnosis not present

## 2017-07-19 DIAGNOSIS — I2510 Atherosclerotic heart disease of native coronary artery without angina pectoris: Secondary | ICD-10-CM

## 2017-07-19 HISTORY — PX: TRANSTHORACIC ECHOCARDIOGRAM: SHX275

## 2017-07-19 LAB — CBC WITH DIFFERENTIAL/PLATELET
BASOS PCT: 1.3 % (ref 0.0–3.0)
Basophils Absolute: 0.1 10*3/uL (ref 0.0–0.1)
EOS ABS: 0.2 10*3/uL (ref 0.0–0.7)
Eosinophils Relative: 4.1 % (ref 0.0–5.0)
HEMATOCRIT: 38.2 % (ref 36.0–46.0)
Hemoglobin: 12.6 g/dL (ref 12.0–15.0)
LYMPHS ABS: 0.8 10*3/uL (ref 0.7–4.0)
LYMPHS PCT: 15.9 % (ref 12.0–46.0)
MCHC: 32.9 g/dL (ref 30.0–36.0)
MCV: 100.6 fl — ABNORMAL HIGH (ref 78.0–100.0)
MONO ABS: 0.5 10*3/uL (ref 0.1–1.0)
Monocytes Relative: 9.6 % (ref 3.0–12.0)
NEUTROS ABS: 3.7 10*3/uL (ref 1.4–7.7)
NEUTROS PCT: 69.1 % (ref 43.0–77.0)
PLATELETS: 325 10*3/uL (ref 150.0–400.0)
RBC: 3.8 Mil/uL — ABNORMAL LOW (ref 3.87–5.11)
RDW: 14.1 % (ref 11.5–15.5)
WBC: 5.3 10*3/uL (ref 4.0–10.5)

## 2017-07-19 LAB — LIPID PANEL
CHOL/HDL RATIO: 3
CHOLESTEROL: 169 mg/dL (ref 0–200)
HDL: 54.8 mg/dL (ref >39.00)
LDL CALC: 83 mg/dL (ref 0–99)
NonHDL: 113.74
TRIGLYCERIDES: 152 mg/dL — AB (ref 0.0–149.0)
VLDL: 30.4 mg/dL (ref 0.0–40.0)

## 2017-07-19 LAB — BASIC METABOLIC PANEL
BUN: 21 mg/dL (ref 6–23)
CHLORIDE: 97 meq/L (ref 96–112)
CO2: 29 meq/L (ref 19–32)
CREATININE: 0.78 mg/dL (ref 0.40–1.20)
Calcium: 9.5 mg/dL (ref 8.4–10.5)
GFR: 74.72 mL/min (ref >60.00)
GLUCOSE: 83 mg/dL (ref 70–99)
Potassium: 4.5 mEq/L (ref 3.5–5.1)
Sodium: 134 mEq/L — ABNORMAL LOW (ref 135–145)

## 2017-07-19 LAB — HEPATIC FUNCTION PANEL
ALT: 20 U/L (ref 0–35)
AST: 17 U/L (ref 0–37)
Albumin: 4.3 g/dL (ref 3.5–5.2)
Alkaline Phosphatase: 90 U/L (ref 39–117)
BILIRUBIN DIRECT: 0.1 mg/dL (ref 0.0–0.3)
BILIRUBIN TOTAL: 0.7 mg/dL (ref 0.2–1.2)
Total Protein: 6.8 g/dL (ref 6.0–8.3)

## 2017-07-19 LAB — T3, FREE: T3, Free: 3.1 pg/mL (ref 2.3–4.2)

## 2017-07-19 LAB — T4, FREE: Free T4: 1.35 ng/dL (ref 0.60–1.60)

## 2017-07-19 LAB — TSH: TSH: 4.79 u[IU]/mL — ABNORMAL HIGH (ref 0.35–4.50)

## 2017-07-19 NOTE — Patient Instructions (Signed)
It was great seeing you today   I will follow up with you regarding your blood work   Please let me know if you need anything    

## 2017-07-19 NOTE — Progress Notes (Signed)
Subjective:    Patient ID: Deborah [name], female    DOB: [birth year], 83 y.o.   MRN: [id number]  HPI  Patient presents for yearly preventative medicine examination. She is a pleasant 83 year old female who  has a past medical history of Anxiety; Bradycardia; CAD S/P percutaneous coronary angioplasty (11/2014); Chronic diastolic heart failure due to valvular disease (HCC) (2015); Diverticulitis; Essential hypertension; H/O: upper GI bleed (12/03/2014); History of uterine cancer (2001); Hyperlipidemia with target LDL less than 70; Hypothyroidism; Paroxysmal atrial fibrillation (HCC) (2014); PFO (patent foramen ovale); Severe mitral regurgitation by prior echocardiogram (09/2014); Sick sinus syndrome (HCC); Thoracic aortic atherosclerosis (HCC) (09/2014); Urinary tract infection; Vasovagal syncope; and Vertigo, central, unspecified laterality.  She is seen by Cardiology due to history atrial fibrillation, bradycardia and CAD with severe MR and diastolic CHF. She reports less fatigue and more energy since stopping Digoxin. For the most part her lower extremity edema is controlled with low dose lasix. She is taking Simvastatin for CAD. She has an upcoming Echo in November   She has not had any issues with a fib recently. She in not on a rate control medication at this time.   Hypertension is controlled with Cozaar 25 mg   She takes Synthroid 75 mcg for hypothyroidism. She has had slightly elevated TSH but normal Free T3 and Free t4 in the past.   All immunizations and health maintenance protocols were reviewed with the patient and needed orders were placed. She is up to date on her vaccinations.   Appropriate screening laboratory values were ordered for the patient including screening of hyperlipidemia, renal function and hepatic function.  Medication reconciliation,  past medical history, social history, problem list and allergies were reviewed in detail with the patient  Goals were established  with regard to weight loss, exercise, and  diet in compliance with medications  End of life planning was discussed. She has an advanced directive and living will   She has no acute complaints today, states " I am feeling really good."   She was recently seen in the hospital for   Review of Systems  Constitutional: Negative.   HENT: Negative.   Eyes: Negative.   Respiratory: Negative.   Cardiovascular: Negative.   Gastrointestinal: Negative.   Endocrine: Negative.   Genitourinary: Negative.   Musculoskeletal: Positive for arthralgias and back pain.  Skin: Negative.   Allergic/Immunologic: Negative.   Neurological: Negative.   Hematological: Negative.   Psychiatric/Behavioral: Negative.   All other systems reviewed and are negative.  Past Medical History:  Diagnosis Date  . Anxiety   . Bradycardia   . CAD S/P percutaneous coronary angioplasty 11/2014   DES PCI to RCA. Cath was done for pre-operative evaluation for mitral repair; LM 30%, LAD 50%, RCA 90% (Promus DES)  . 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 Normal EF by echo.  . Diverticulitis   . Essential hypertension   . H/O: upper GI bleed 12/03/2014   While on ASA/Plavix & Warfarin --> Hct dropped to 18%; small AVM noted but no overt pathology.  [name] History of uterine cancer 2001   Status post hysterectomy  . Hyperlipidemia with target LDL less than 70    Coronary disease and thoracic aortic atheroma  . Hypothyroidism    On Levothyroxine  . Paroxysmal atrial fibrillation (HCC) 2014   Associated with sick sinus syndrome. -- Intolerant of beta blockers and diltiazem. Rate control with digoxin. Not  on anticoagulation despite CHA2DS2Vasc 6  2/2 prior severe GI bleed on triple therapy  . PFO (patent foramen ovale)    Noted on TEE - L-R shunt (elsewhere reported it as a ASD)  . Severe mitral regurgitation by prior echocardiogram 09/2014   Seen on TEE to  have severe MR with multiple jets. Vena contracted 0.8; Consideration had been ? Mitral E-Clip - put on hold after GI Bleed.  . Sick sinus syndrome (HCC)    Status post Loop Recorder: Medtronic Linq -- most recent interrogation 05/19/2016: 2 new pauses. One greater than 3 seconds at 0 320 the morning. Second was 4.4 seconds at 9:56 PM; also noted to have an episode of A. fib. --> Recommended further follow-up upon arrival to [facility].  . Thoracic aortic atherosclerosis (HCC) 09/2014   Moderate grade 3-4 atheroma noted on TEE  . Urinary tract infection   . Vasovagal syncope    Exacerbated by bradycardia, sick sinus syndrome, and mitral regurgitation.  . Vertigo, central, unspecified laterality    Chronic dizziness.    Social History   Social History  . Marital status: Married    Spouse name: N/A  . Number of children: 2  . Years of education: N/A   Occupational History  .      Retired Passenger transport manager   Social History Main Topics  . Smoking status: Former Smoker    Packs/day: 0.50    Years: 5.00    Types: Cigarettes    Quit date: 08/02/1970  . Smokeless tobacco: Never Used  . Alcohol use Yes     Comment: "wine occasionally"  . Drug use: No  . Sexual activity: Not on file   Other Topics Concern  . Not on file   Social History Narrative   Married for 59 years    Has a son, daughter died of breast cancer   One grandson    Moved from [place] -( Lives there from May- October) to be closer to their son & only remaining family.    Past Surgical History:  Procedure Laterality Date  . ABDOMINAL HYSTERECTOMY  2001  . Cataract Surgery  Bilateral   . COLONOSCOPY W/ BIOPSIES  2006   2 polyps noted along with diverticulitis  . CORONARY ANGIOPLASTY WITH STENT PLACEMENT  11/2014   [facility]: 90% RCA - PCI with Promus DES.  [name] implanted cardiac monitor     . LOOP RECORDER INSERTION  07/24/2013   Medtronic Linq - to evaluate SSS for  symptomatic bradycardia  . Right and Left Heart Catheterization  11/2014   RHC: RAP 4, RVP 32/4, PA peak 29/7/16. LVEDP 18.  CORS: 30% LM, 50% LAD, 90% RCA --> PCI (Promus DES)  . TEE WITHOUT CARDIOVERSION  09/2014   Normal LV function. Severe MR with multiple jets. Vena contractile 0.8.  PFO with L-R shunt; moderate grade 3-4 aortic atheroma  . TONSILLECTOMY    . TRANSTHORACIC ECHOCARDIOGRAM  07/2013   Severe left atrial enlargement (volume 57 mL). Normal EF. Moderate TR. ASD. Moderate MR. No pulmonary hypertension.  . TRANSTHORACIC ECHOCARDIOGRAM  08/2014   Normal LV function with biatrial enlargement. Moderate-severe mitral and tricuspid insufficiency.    Family History  Problem Relation Age of Onset  . Stomach cancer Father   . Cancer Brother   . Diabetes Brother   . Cancer Daughter     Allergies  Allergen Reactions  . Carvedilol Other (See Comments)    Bradycardia   .  Diltiazem Other (See Comments)    Bradycardia  . Lopressor [Metoprolol] Other (See Comments)    Bradycardia    Current Outpatient Prescriptions on File Prior to Visit  Medication Sig Dispense Refill  . acetaminophen (TYLENOL 8 HOUR ARTHRITIS PAIN) 650 MG CR tablet Take 1,300 mg by mouth every 8 (eight) hours as needed for pain.    [name] aspirin EC 81 MG tablet Take 81 mg by mouth daily.    . ferrous sulfate 325 (65 FE) MG tablet Take 325 mg by mouth 2 (two) times daily with a meal.     . fluticasone (FLONASE) 50 MCG/ACT nasal spray Place 2 sprays into both nostrils daily as needed for allergies or rhinitis.     . furosemide (LASIX) 20 MG tablet Take 1 tablet (20 mg total) by mouth every other day. 90 tablet 3  . levothyroxine (SYNTHROID, LEVOTHROID) 75 MCG tablet TAKE 1 TABLET (75 MCG TOTAL) BY MOUTH DAILY BEFORE BREAKFAST. 90 tablet 0  . losartan (COZAAR) 25 MG tablet Take 1 tablet (25 mg total) by mouth daily. 90 tablet 3  . omeprazole (PRILOSEC) 20 MG capsule Take 1 capsule (20 mg total) by mouth daily. 90  capsule 3  . polyethylene glycol (MIRALAX / GLYCOLAX) packet Take 17 g by mouth daily.    . potassium chloride (K-DUR,KLOR-CON) 10 MEQ tablet Take 1 tablet (10 mEq total) by mouth 2 (two) times daily. 90 tablet 3  . simvastatin (ZOCOR) 20 MG tablet Take 1 tablet (20 mg total) by mouth at bedtime. 90 tablet 3   No current facility-administered medications on file prior to visit.     BP 94/60 (BP Location: Left Arm)   Temp 98.2 F (36.8 C)   Ht 5' (1.524 m)   Wt 122 lb (55.3 kg)   LMP  (LMP Unknown)   BMI 23.83 kg/m       Objective:   Physical Exam  Constitutional: She is oriented to person, place, and time. She appears well-developed and well-nourished. No distress.  HENT:  Head: Normocephalic and atraumatic.  Right Ear: External ear normal.  Left Ear: External ear normal.  Nose: Nose normal.  Mouth/Throat: Oropharynx is clear and moist. No oropharyngeal exudate.  Eyes: Pupils are equal, round, and reactive to light. Conjunctivae and EOM are normal. Right eye exhibits no discharge. Left eye exhibits no discharge. No scleral icterus.  Neck: Normal range of motion. Neck supple. No JVD present. No tracheal deviation present. No thyromegaly present.  Cardiovascular: Normal rate, regular rhythm, normal heart sounds and intact distal pulses.  Exam reveals no gallop and no friction rub.   No murmur heard. Pulmonary/Chest: Effort normal and breath sounds normal. No stridor. No respiratory distress. She has no wheezes. She has no rales. She exhibits no tenderness.  Abdominal: Soft. Bowel sounds are normal. She exhibits no distension and no mass. There is no tenderness. There is no rebound and no guarding.  Musculoskeletal: Normal range of motion. She exhibits no edema, tenderness or deformity.  Lymphadenopathy:    She has no cervical adenopathy.  Neurological: She is alert and oriented to person, place, and time. She has normal reflexes. She displays normal reflexes. No cranial nerve  deficit. She exhibits normal muscle tone. Coordination normal.  Skin: Skin is warm and dry. No rash noted. She is not diaphoretic. No erythema. No pallor.  Psychiatric: She has a normal mood and affect. Her behavior is normal. Judgment and thought content normal.  Nursing note and vitals reviewed.  Assessment & Plan:  1. Routine general medical examination at a health care facility - Basic metabolic panel - CBC with Differential/Platelet - Hepatic function panel - Lipid panel - TSH  2. Essential hypertension - Hypotensive today but asy. Advised to monitor at home and let me know if blood pressure is consistently below 120 systolic  - Basic metabolic panel - CBC with Differential/Platelet - Hepatic function panel - Lipid panel - TSH  3. Hypothyroidism, unspecified type - Well controlled. Consider dose increase if needed - TSH - T4, Free - T3, Free  4. CAD S/P DES PCI to RCA - Continue with plan from cardiology  - Basic metabolic panel - CBC with Differential/Platelet - Lipid panel   Shirline Freesory Salbador Fiveash, NP

## 2017-07-26 ENCOUNTER — Telehealth: Payer: Self-pay | Admitting: *Deleted

## 2017-07-26 NOTE — Telephone Encounter (Signed)
-----   Message from Tobin ChadSharon Madeleine Fenn V, RN sent at 01/18/2017  3:33 PM EDT ----- LABS Aug 04, 2017  CMP LIPID MAIL @OCT   17 ,2018

## 2017-07-26 NOTE — Telephone Encounter (Signed)
Patient had labs at primary office

## 2017-08-06 ENCOUNTER — Encounter: Payer: Self-pay | Admitting: Adult Health

## 2017-08-07 ENCOUNTER — Other Ambulatory Visit: Payer: Self-pay | Admitting: Adult Health

## 2017-08-07 MED ORDER — MELOXICAM 7.5 MG PO TABS: 7.5000 mg | ORAL_TABLET | Freq: Every day | ORAL | 1 refills | Status: DC

## 2017-08-08 ENCOUNTER — Ambulatory Visit (HOSPITAL_COMMUNITY): Payer: Medicare HMO | Attending: Internal Medicine

## 2017-08-08 ENCOUNTER — Other Ambulatory Visit: Payer: Self-pay

## 2017-08-08 DIAGNOSIS — I2510 Atherosclerotic heart disease of native coronary artery without angina pectoris: Secondary | ICD-10-CM

## 2017-08-08 DIAGNOSIS — I080 Rheumatic disorders of both mitral and aortic valves: Secondary | ICD-10-CM

## 2017-08-08 DIAGNOSIS — I5032 Chronic diastolic (congestive) heart failure: Secondary | ICD-10-CM

## 2017-08-08 DIAGNOSIS — I083 Combined rheumatic disorders of mitral, aortic and tricuspid valves: Secondary | ICD-10-CM | POA: Insufficient documentation

## 2017-08-08 DIAGNOSIS — I38 Endocarditis, valve unspecified: Secondary | ICD-10-CM | POA: Diagnosis not present

## 2017-08-19 NOTE — Progress Notes (Signed)
Electrophysiology Office Note   Date:  08/22/2017   ID:  Deborah [name], DOB 09-Mar-1931, MRN [id number]  PCP:  Shirline FreesNafziger, [name], NP  Cardiologist:  Dr [name] Primary Electrophysiologist: [name]  Deborah ApaRose [name] is a 83 y.o. female seen today for Dr [name].  She presents today for routine electrophysiology follow up.  She has done relatively well since last seen. Recent echo with worsening TR and MR.  She is lightheaded today in clinic with normal BP and HR.  Today, she denies symptoms of palpitations, chest pain, shortness of breath, orthopnea, PND, lower extremity edema, claudication, presyncope, syncope, bleeding, or neurologic sequela. The patient is tolerating medications without difficulties and is otherwise without complaint today.    Past Medical History:  Diagnosis Date  . Anxiety   . Bradycardia   . CAD S/P percutaneous coronary angioplasty 11/2014   DES PCI to RCA. Cath was done for pre-operative evaluation for mitral repair; LM 30%, LAD 50%, RCA 90% (Promus DES)  . 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 Normal EF by echo.  . Diverticulitis   . Essential hypertension   . H/O: upper GI bleed 12/03/2014   While on ASA/Plavix & Warfarin --> Hct dropped to 18%; small AVM noted but no overt pathology.  Deborah [name]. History of uterine cancer 2001   Status post hysterectomy  . Hyperlipidemia with target LDL less than 70    Coronary disease and thoracic aortic atheroma  . Hypothyroidism    On Levothyroxine  . Paroxysmal atrial fibrillation (HCC) 2014   Associated with sick sinus syndrome. -- Intolerant of beta blockers and diltiazem. Rate control with digoxin. Not on anticoagulation despite CHA2DS2Vasc 6  2/2 prior severe GI bleed on triple therapy  . PFO (patent foramen ovale)    Noted on TEE - L-R shunt (elsewhere reported it as a ASD)  . Severe mitral regurgitation by prior echocardiogram 09/2014   Seen on TEE to have  severe MR with multiple jets. Vena contracted 0.8; Consideration had been ? Mitral E-Clip - put on hold after GI Bleed.  . Sick sinus syndrome (HCC)    Status post Loop Recorder: Medtronic Linq -- most recent interrogation 05/19/2016: 2 new pauses. One greater than 3 seconds at 0 320 the morning. Second was 4.4 seconds at 9:56 PM; also noted to have an episode of A. fib. --> Recommended further follow-up [facility]  . Thoracic aortic atherosclerosis (HCC) 09/2014   Moderate grade 3-4 atheroma noted on TEE  . Urinary tract infection   . Vasovagal syncope    Exacerbated by bradycardia, sick sinus syndrome, and mitral regurgitation.  . Vertigo, central, unspecified laterality    Chronic dizziness.   Past Surgical History:  Procedure Laterality Date  . ABDOMINAL HYSTERECTOMY  2001  . Cataract Surgery  Bilateral   . COLONOSCOPY W/ BIOPSIES  2006   2 polyps noted along with diverticulitis  . CORONARY ANGIOPLASTY WITH STENT PLACEMENT  11/2014   [facility]: 90% RCA - PCI with Promus DES.  Deborah [name]. implanted cardiac monitor     . LOOP RECORDER INSERTION  07/24/2013   Medtronic Linq - to evaluate SSS for symptomatic bradycardia  . Right and Left Heart Catheterization  11/2014   RHC: RAP 4, RVP 32/4, PA peak 29/7/16. LVEDP 18.  CORS: 30% LM, 50% LAD, 90% RCA --> PCI (Promus DES)  . TEE WITHOUT CARDIOVERSION  09/2014   Normal  LV function. Severe MR with multiple jets. Vena contractile 0.8.  PFO with L-R shunt; moderate grade 3-4 aortic atheroma  . TONSILLECTOMY    . TRANSTHORACIC ECHOCARDIOGRAM  07/2013   Severe left atrial enlargement (volume 57 mL). Normal EF. Moderate TR. ASD. Moderate MR. No pulmonary hypertension.  . TRANSTHORACIC ECHOCARDIOGRAM  08/2014   Normal LV function with biatrial enlargement. Moderate-severe mitral and tricuspid insufficiency.     Current Outpatient Medications  Medication Sig Dispense Refill  . acetaminophen  (TYLENOL 8 HOUR ARTHRITIS PAIN) 650 MG CR tablet Take 1,300 mg by mouth every 8 (eight) hours as needed for pain.    Deborah [name]. aspirin EC 81 MG tablet Take 81 mg by mouth daily.    . ferrous sulfate 325 (65 FE) MG tablet Take 325 mg by mouth 2 (two) times daily with a meal.     . fluticasone (FLONASE) 50 MCG/ACT nasal spray Place 2 sprays into both nostrils daily as needed for allergies or rhinitis.     . furosemide (LASIX) 20 MG tablet Take 1 tablet (20 mg total) by mouth every other day. 90 tablet 3  . levothyroxine (SYNTHROID, LEVOTHROID) 75 MCG tablet TAKE 1 TABLET (75 MCG TOTAL) BY MOUTH DAILY BEFORE BREAKFAST. 90 tablet 0  . losartan (COZAAR) 25 MG tablet Take 1 tablet (25 mg total) by mouth daily. 90 tablet 3  . meloxicam (MOBIC) 7.5 MG tablet Take 1 tablet (7.5 mg total) by mouth daily. 90 tablet 1  . omeprazole (PRILOSEC) 20 MG capsule Take 1 capsule (20 mg total) by mouth daily. 90 capsule 3  . polyethylene glycol (MIRALAX / GLYCOLAX) packet Take 17 g by mouth daily.    . potassium chloride (K-DUR,KLOR-CON) 10 MEQ tablet Take 1 tablet (10 mEq total) by mouth 2 (two) times daily. 90 tablet 3  . simvastatin (ZOCOR) 20 MG tablet Take 1 tablet (20 mg total) by mouth at bedtime. 90 tablet 3   No current facility-administered medications for this visit.     Allergies:   Carvedilol; Diltiazem; and Lopressor [metoprolol]   Social History:  The patient  reports that she quit smoking about 47 years ago. Her smoking use included cigarettes. She has a 2.50 pack-year smoking history. she has never used smokeless tobacco. She reports that she drinks alcohol. She reports that she does not use drugs.   Family History:  The patient's  family history includes Cancer in her brother and daughter; Diabetes in her brother; Stomach cancer in her father.    ROS:  Please see the history of present illness.   All other systems are reviewed and negative.    PHYSICAL EXAM: VS:  BP 128/62   Pulse 83   Ht 5' (1.524  m)   LMP  (LMP Unknown)   SpO2 97%   BMI 23.83 kg/m  , BMI Body mass index is 23.83 kg/m. GEN: elderly and frail, in no acute distress  HEENT: normal  Neck: no JVD, carotid bruits, or masses Cardiac: iRRR; 2/6 SEM ,no edema  Respiratory:  clear to auscultation bilaterally, normal work of breathing GI: soft, nontender, nondistended, + BS MS: no deformity or atrophy  Skin: warm and dry  Neuro:  Strength and sensation are intact Psych: euthymic mood, full affect  EKG is not done today   Recent Labs: 07/03/2017: B Natriuretic Peptide 220.7 07/19/2017: ALT 20; BUN 21; Creatinine, Ser 0.78; Hemoglobin 12.6; Platelets 325.0; Potassium 4.5; Sodium 134; TSH 4.79    Lipid Panel     Component  Value Date/Time   CHOL 169 07/19/2017 1013   TRIG 152.0 (H) 07/19/2017 1013   HDL 54.80 07/19/2017 1013   CHOLHDL 3 07/19/2017 1013   VLDL 30.4 07/19/2017 1013   LDLCALC 83 07/19/2017 1013     Wt Readings from Last 3 Encounters:  07/19/17 122 lb (55.3 kg)  01/18/17 122 lb 6.4 oz (55.5 kg)  01/03/17 121 lb (54.9 kg)     ASSESSMENT AND PLAN:  1.  Sinus pauses Previously asymptomatic Improved off digoxin Will continue to follow. She is aware to call the office for symptoms.  ILR at RRT.  Unable to communicate with device today.  I have offered explant, she declines.   2. Paroxysmal afib chads2vasc score is at least 6.  She had bleeding previously on triple therapy She has declined OAC  3. HTN Stable No change required today  4. CAD No ischemic symptoms No changes today   Follow-up with EP prn, Dr Herbie Baltimore as scheduled   Signed, Gypsy Balsam, NP  08/22/2017 11:15 AM [facility] [address] [place] [address] [phone] (office) [phone] (fax)

## 2017-08-22 ENCOUNTER — Encounter: Payer: Self-pay | Admitting: Nurse Practitioner

## 2017-08-22 ENCOUNTER — Ambulatory Visit: Payer: Medicare HMO | Admitting: Nurse Practitioner

## 2017-08-22 VITALS — BP 128/62 | HR 83 | Ht 60.0 in

## 2017-08-22 DIAGNOSIS — I495 Sick sinus syndrome: Secondary | ICD-10-CM | POA: Diagnosis not present

## 2017-08-22 DIAGNOSIS — I480 Paroxysmal atrial fibrillation: Secondary | ICD-10-CM

## 2017-08-22 NOTE — Patient Instructions (Signed)
CONTACT [facility] [phone] AS NEEDED FOR  ANY CARDIAC RELATED SYMPTOMS  

## 2017-08-27 ENCOUNTER — Ambulatory Visit: Payer: Medicare HMO | Admitting: Cardiology

## 2017-09-04 ENCOUNTER — Encounter: Payer: Self-pay | Admitting: Cardiology

## 2017-09-04 ENCOUNTER — Ambulatory Visit: Payer: Medicare HMO | Admitting: Cardiology

## 2017-09-04 VITALS — BP 163/76 | HR 68 | Ht 60.0 in | Wt 124.2 lb

## 2017-09-04 DIAGNOSIS — I080 Rheumatic disorders of both mitral and aortic valves: Secondary | ICD-10-CM

## 2017-09-04 DIAGNOSIS — I480 Paroxysmal atrial fibrillation: Secondary | ICD-10-CM

## 2017-09-04 DIAGNOSIS — R55 Syncope and collapse: Secondary | ICD-10-CM | POA: Diagnosis not present

## 2017-09-04 DIAGNOSIS — I495 Sick sinus syndrome: Secondary | ICD-10-CM | POA: Diagnosis not present

## 2017-09-04 DIAGNOSIS — I38 Endocarditis, valve unspecified: Secondary | ICD-10-CM

## 2017-09-04 DIAGNOSIS — Z9861 Coronary angioplasty status: Secondary | ICD-10-CM | POA: Diagnosis not present

## 2017-09-04 DIAGNOSIS — I2510 Atherosclerotic heart disease of native coronary artery without angina pectoris: Secondary | ICD-10-CM

## 2017-09-04 DIAGNOSIS — I10 Essential (primary) hypertension: Secondary | ICD-10-CM

## 2017-09-04 DIAGNOSIS — I5032 Chronic diastolic (congestive) heart failure: Secondary | ICD-10-CM | POA: Diagnosis not present

## 2017-09-04 NOTE — Progress Notes (Signed)
PCP: Shirline FreesNafziger, [name], NP  Clinic Note: Chief Complaint  Patient presents with  . Follow-up    6 months;  . Atrial Fibrillation  . Cardiac Valve Problem    Mitral regurgitation    HPI: Deborah [name] is a 83 y.o. female who is being seen today for Follow-up evaluation of atrial fibrillation, bradycardia and CAD with severe MR and diastolic CHF.  --She has declined pacemaker and DOAC on multiple occasions. Symptoms of lightheadedness and dizziness improved with discontinuation of digoxin.  I last saw Deborah [name] in May -she was doing relatively well.  She indicated about a month prior to that she had a short episode of fast heartbeats but nothing significant.  No fatigue or lightheadedness.  She just had some spacey headed feeling.  Otherwise no complaints.  Deborah [name] was last seen on December 5 by Gypsy BalsamAmber Seiler, NP in the EP clinic -> had done relatively well.  Echocardiogram with worsening valve lesions was noted.  Again indicated lack of desire to do further evaluation.  She denied any palpitations or chest pain dyspnea etc.  Tolerating medications without issues.  No significant sinus pauses.  Agreed with being off digoxin.  It appears that her loop recorder is no longer functional (likely the battery is dead.  She declined DOAC.  Recent Hospitalizations:   Episode of near syncope July 03, 2017: She had an episode where she was shaking had some speech issues.  There is concern of possible stroke.  --She was evaluated by the hospitalist, but was discharged on low-dose Xanax for potential anxiety.  Be discharged home and no further episodes.  Studies Personally Reviewed - if available, images/films reviewed: From Epic Chart or Care Everywhere:  2-D echo December 2017: EF 60-65%. GR 2 DD. No R WMA. Moderate AI. Moderate MR. Severe LA dilation. Mild pulmonary hypertension  2 D Echo Nov 2018: EF 60-65%.  Moderate-severe MR.  Moderate TR with mildly elevated RVSP.  (Recommend considering TEE  to better evaluate MR)  Interval History: Deborah DupreRose presents today really without cardiac issues.  She just has been noticing more sleepiness.  The biggest concern is her profoundly reduced balance and overall whole body weakness.  She is not having any more the tremulous episode that she had when she went to the emergency room back in October, but she has really noted profound loss of balance. She is not having cardiac symptoms of chest pain or pressure.  No significant dyspnea.  Just still having some lease see headed symptoms but is not noticing changes in heart rates are heartbeats.  She has not lost consciousness, but has episodes where she feels like she is may be floating.  She is not "all there".  Very unsteady gait.  Standing up simply is a chore.  She has to hold onto her husband to walk anywhere.  As such, she really has not exerting herself at all in order to determine if she has any discomfort in her chest or dyspnea. No real PND orthopnea and only trivial edema.  No seizure symptoms or TIA/amaurosis fugax.  She has not had true syncope but is certainly had some very dizzy spells.   ROS: A comprehensive was performed. Pertinent positives and negatives noted above. Review of Systems  Constitutional: Negative for malaise/fatigue.  HENT: Negative for congestion and nosebleeds.   Respiratory: Negative for shortness of breath.   Cardiovascular: Positive for leg swelling (Minimal).  Gastrointestinal: Negative for blood in stool and melena.  Genitourinary: Negative for hematuria.  Musculoskeletal: Positive for back pain and joint pain.  Neurological: Positive for dizziness and weakness (Global).       Very poor balance.  Bilateral extremity and core weakness  Psychiatric/Behavioral: Positive for depression. The patient is nervous/anxious.   All other systems reviewed and are negative.  I have reviewed and (if needed) personally updated the patient's problem list, medications, allergies, past  medical and surgical history, social and family history.   Past Medical History:  Diagnosis Date  . Anxiety   . Bradycardia   . CAD S/P percutaneous coronary angioplasty 11/2014   DES PCI to RCA. Cath was done for pre-operative evaluation for mitral repair; LM 30%, LAD 50%, RCA 90% (Promus DES)  . 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 Normal EF by echo.  . Diverticulitis   . Essential hypertension   . H/O: upper GI bleed 12/03/2014   While on ASA/Plavix & Warfarin --> Hct dropped to 18%; small AVM noted but no overt pathology.  [name] History of uterine cancer 2001   Status post hysterectomy  . Hyperlipidemia with target LDL less than 70    Coronary disease and thoracic aortic atheroma  . Hypothyroidism    On Levothyroxine  . Paroxysmal atrial fibrillation (HCC) 2014   Associated with sick sinus syndrome. -- Intolerant of beta blockers and diltiazem. Rate control with digoxin. Not on anticoagulation despite CHA2DS2Vasc 6  2/2 prior severe GI bleed on triple therapy  . PFO (patent foramen ovale)    Noted on TEE - L-R shunt (elsewhere reported it as a ASD)  . Severe mitral regurgitation by prior echocardiogram 09/2014   Seen on TEE to have severe MR with multiple jets. Vena contracted 0.8; Consideration had been ? Mitral E-Clip - put on hold after GI Bleed.  . Sick sinus syndrome (HCC)    Status post Loop Recorder: [employer] Linq -- most recent interrogation 05/19/2016: 2 new pauses. One greater than 3 seconds at 0 320 the morning. Second was 4.4 seconds at 9:56 PM; also noted to have an episode of A. fib. --> Recommended further follow-up upon arrival to [facility].  . Thoracic aortic atherosclerosis (HCC) 09/2014   Moderate grade 3-4 atheroma noted on TEE  . Urinary tract infection   . Vasovagal syncope    Exacerbated by bradycardia, sick sinus syndrome, and mitral regurgitation.  . Vertigo, central, unspecified  laterality    Chronic dizziness.    Past Surgical History:  Procedure Laterality Date  . ABDOMINAL HYSTERECTOMY  2001  . Cataract Surgery  Bilateral   . COLONOSCOPY W/ BIOPSIES  2006   2 polyps noted along with diverticulitis  . CORONARY ANGIOPLASTY WITH STENT PLACEMENT  11/2014   [facility]: 90% RCA - PCI with Promus DES.  [name] implanted cardiac monitor     . LOOP RECORDER INSERTION  07/24/2013   Medtronic Linq - to evaluate SSS for symptomatic bradycardia  . Right and Left Heart Catheterization  11/2014   RHC: RAP 4, RVP 32/4, PA peak 29/7/16. LVEDP 18.  CORS: 30% LM, 50% LAD, 90% RCA --> PCI (Promus DES)  . TEE WITHOUT CARDIOVERSION  09/2014   Normal LV function. Severe MR with multiple jets. Vena contractile 0.8.  PFO with L-R shunt; moderate grade 3-4 aortic atheroma  . TONSILLECTOMY    . TRANSTHORACIC ECHOCARDIOGRAM  07/2013   Severe left atrial enlargement (volume 57 mL). Normal EF. Moderate TR. ASD. Moderate  MR. No pulmonary hypertension.  . TRANSTHORACIC ECHOCARDIOGRAM  08/2014   Normal LV function with biatrial enlargement. Moderate-severe mitral and tricuspid insufficiency.    Current Meds  Medication Sig  . acetaminophen (TYLENOL 8 HOUR ARTHRITIS PAIN) 650 MG CR tablet Take 1,300 mg by mouth every 8 (eight) hours as needed for pain.  [name] aspirin EC 81 MG tablet Take 81 mg by mouth daily.  . ferrous sulfate 325 (65 FE) MG tablet Take 325 mg by mouth 2 (two) times daily with a meal.   . fluticasone (FLONASE) 50 MCG/ACT nasal spray Place 2 sprays into both nostrils daily as needed for allergies or rhinitis.   . furosemide (LASIX) 20 MG tablet Take 1 tablet (20 mg total) by mouth every other day.  . levothyroxine (SYNTHROID, LEVOTHROID) 75 MCG tablet TAKE 1 TABLET (75 MCG TOTAL) BY MOUTH DAILY BEFORE BREAKFAST.  [name] losartan (COZAAR) 25 MG tablet Take 1 tablet (25 mg total) by mouth daily.  . meloxicam (MOBIC) 7.5 MG tablet Take 1 tablet  (7.5 mg total) by mouth daily.  [name] omeprazole (PRILOSEC) 20 MG capsule Take 1 capsule (20 mg total) by mouth daily.  . polyethylene glycol (MIRALAX / GLYCOLAX) packet Take 17 g by mouth daily.  . potassium chloride (K-DUR,KLOR-CON) 10 MEQ tablet Take 1 tablet (10 mEq total) by mouth 2 (two) times daily.    Allergies  Allergen Reactions  . Carvedilol Other (See Comments)    Bradycardia   . Diltiazem Other (See Comments)    Bradycardia  . Lopressor [Metoprolol] Other (See Comments)    Bradycardia    Social History   Socioeconomic History  . Marital status: Married    Spouse name: None  . Number of children: 2  . Years of education: None  . Highest education level: None  Social Needs  . Financial resource strain: None  . Food insecurity - worry: None  . Food insecurity - inability: None  . Transportation needs - medical: None  . Transportation needs - non-medical: None  Occupational History    Comment: Retired Passenger transport manager  Tobacco Use  . Smoking status: Former Smoker    Packs/day: 0.50    Years: 5.00    Pack years: 2.50    Types: Cigarettes    Last attempt to quit: 08/02/1970    Years since quitting: 47.1  . Smokeless tobacco: Never Used  Substance and Sexual Activity  . Alcohol use: Yes    Comment: "wine occasionally"  . Drug use: No  . Sexual activity: None  Other Topics Concern  . None  Social History Narrative   Married for 59 years    Has a son, daughter died of breast cancer   One grandson    Moved from [place] -( Lives there from May- October) to be closer to their son & only remaining family.    family history includes Cancer in her brother and daughter; Diabetes in her brother; Stomach cancer in her father.  Wt Readings from Last 3 Encounters:  09/04/17 124 lb 3.2 oz (56.3 kg)  07/19/17 122 lb (55.3 kg)  01/18/17 122 lb 6.4 oz (55.5 kg)    PHYSICAL EXAM BP (!) 163/76   Pulse 68   Ht 5' (1.524 m)   Wt 124 lb 3.2 oz (56.3 kg)   LMP  (LMP  Unknown)   BMI 24.26 kg/m   Physical Exam  Constitutional: No distress.  Very thin, frail woman.  She appears older than her stated age now.  She actually appears to be very "out of it ".  She seems to be lapsing in and out of conversations. She has significant kyphoscoliosis and is a mouth breather.  HENT:  Head: Atraumatic.  Eyes: EOM are normal.  Neck: Hepatojugular reflux (Versus palpable carotid) present. No JVD present. Carotid bruit is present (Difficult to tell if his carotid bruit or simply radiated murmur.  Probably more murmur.).  Cardiovascular: Normal rate and regular rhythm.  Occasional extrasystoles are present. PMI is not displaced. Exam reveals no gallop.  Murmur heard.  Harsh crescendo-decrescendo midsystolic murmur is present with a grade of 1/6 at the upper right sternal border radiating to the neck. High-pitched blowing holosystolic murmur of grade 2/6 is also present at the apex radiating to the back. Pulmonary/Chest: Effort normal. No respiratory distress. She has no wheezes. She has no rales.  Mouth breathing.  Mild interstitial sounds, but no rales or  Abdominal: Soft. Bowel sounds are normal. She exhibits no distension. There is no tenderness. There is no rebound.  Neurological: Coordination (Very.  But she did not have positive pronator drift.  Was able to touch finger to nose, albeit very slow.  With eyes closed, however she was swaying to and fro and had to hold on before falling.  became quite fearful despite having her husband behind her) abnormal.  She is awake and alert as well as oriented.  But seems to be dazed  Psychiatric:  She seems very different than her normal self.  Very anxious with his staring glare.  Almost as though she is frightened.    Adult ECG Report -NA  Other studies Reviewed: Additional studies/ records that were reviewed today include:  Recent Labs:    Lab Results  Component Value Date   CHOL 169 07/19/2017   HDL 54.80 07/19/2017     LDLCALC 83 07/19/2017   TRIG 152.0 (H) 07/19/2017   CHOLHDL 3 07/19/2017    ASSESSMENT / PLAN:  Although her strange affect and and mental status today seem to be the main focus of our evaluation, this does not seem to be cardiac in nature.  She did not seem to have any pauses, however did not seem to be having any arrhythmias.  Her blood pressure was stable, if anything high. I am very concerned about her walking and strongly recommended that she uses a walker.  Her husband is going to try to at least get her to use a cane, but I really think that a rolling walker with seat is 100% called for.  I will defer to her PCP on follow-up.  At present, completely avoiding beta-blockers.  Minimal doses of Lasix simply to keep her edema at bay. I would clearly allow for permissive hypertension in the absence of any heart failure symptoms.  Because of her unsteady gait and strange neurologic symptoms, I am simply stopping her statin to ensure that there is no complication there.  At this point quality of life is important.  This would leave her only on minimal dose of losartan, aspirin and low-dose Lasix.  In order to avoid potential orthostatic symptoms and vasovagal syncope, I strongly recommended increasing hydration. Would also consider support stockings (she does not wear them all the time)  She does have moderate to severe mitral regurgitation, but after discussing with the patient and her husband, I do not think the plan is to do any further evaluation.  They were not interested in TEE or any invasive procedures such as cardiac catheterization. This  is a difficult situation, because of with mitral regurgitation, it is important to control blood pressure, however I think permissive hypertension is also very important in this situation.  In the absence of significant heart failure symptoms I would err on the side of permissive hypertension with low-dose ARB and low-dose Lasix to keep her from  getting in trouble.  She is not currently having any heart failure symptoms despite having relatively high blood pressures.  She is status post PCI, but she is no longer on Plavix.  Only on aspirin.  Clearly not a beta-blocker because of bradycardia.  Her atrial fibrillation rate is well controlled without any agents.  Thankfully it does not seem that she is having further pauses.  Problem List Items Addressed This Visit    CAD S/P DES PCI to RCA (Chronic)   Chronic diastolic heart failure due to valvular disease (HCC) (Chronic)   Coronary artery disease involving native heart without angina pectoris - Primary (Chronic)   Essential hypertension (Chronic)   Mitral regurgitation and aortic stenosis (Chronic)   Paroxysmal atrial fibrillation (HCC): CHA2DS2Vasc = 6; Not currently on AC. (Chronic)   SSS (sick sinus syndrome) (HCC) (Chronic)   Vasovagal syncope (Chronic)      Unusual visit.  Majority of visit did not have much to do with cardiac issues, was more related to her usual neurologic state.  Somewhat complicated with extensive problem list.  I spent at least 45 minutes with the patient and her husband. >75% of the time was spent in direct consultation.  Current medicines are reviewed at length with the patient today. (+/- concerns) None The following changes have been made: None  Patient Instructions  medication instructions  --stop taking simvastatin  No other changes to medicine    DRINK AT LEAST 8-10 GLASSES A DAY AT A MINIUM     Recommend you walk as mucha possible and use your cane.   Your physician wants you to follow-up in 6 months with DR HARDING. You will receive a reminder letter in the mail two months in advance. If you don't receive a letter, please call our office to schedule the follow-up appointment.   If you need a refill on your cardiac medications before your next appointment, please call your pharmacy.   Studies Ordered:   No orders of the  defined types were placed in this encounter.     Bryan Lemmaavid Harding, M.D., M.S. Interventional Cardiologist   Pager # [phone] Phone # [phone] [address] [place], [place] [id number]

## 2017-09-04 NOTE — Patient Instructions (Signed)
medication instructions  --stop taking simvastatin  No other changes to medicine    DRINK AT LEAST 8-10 GLASSES A DAY AT A MINIUM     Recommend you walk as mucha possible and use your cane.   Your physician wants you to follow-up in 6 months with DR HARDING. You will receive a reminder letter in the mail two months in advance. If you don't receive a letter, please call our office to schedule the follow-up appointment.   If you need a refill on your cardiac medications before your next appointment, please call your pharmacy.

## 2017-10-22 ENCOUNTER — Ambulatory Visit: Payer: Self-pay | Admitting: *Deleted

## 2017-10-22 ENCOUNTER — Ambulatory Visit: Payer: Medicare HMO | Admitting: Family Medicine

## 2017-10-22 ENCOUNTER — Encounter: Payer: Self-pay | Admitting: Family Medicine

## 2017-10-22 VITALS — BP 120/84 | HR 82 | Temp 97.5°F | Wt 123.0 lb

## 2017-10-22 DIAGNOSIS — R233 Spontaneous ecchymoses: Secondary | ICD-10-CM | POA: Diagnosis not present

## 2017-10-22 NOTE — Progress Notes (Signed)
Deborah [name] is a 84 year old widowed female comes in today company by her son for evaluation of a spontaneous bruise on her face  Says she got up took her shower looks in the mirror and noticed some bruising and extended from her upper right eyebrow down to her cheek. She does not recall any history of trauma.  She is on aspirin  Review of systems otherwise negative  BP 120/84 (BP Location: Right Arm, Patient Position: Sitting, Cuff Size: Normal)   Pulse 82   Temp (!) 97.5 F (36.4 C) (Oral)   Wt 123 lb (55.8 kg)   LMP  (LMP Unknown)   BMI 24.02 kg/m  Will develop well-nourished female no acute distress examination skin shows a small hemorrhage subcutaneous extending from the eyebrow down to the nose the upper cheek  Spontaneous hemorrhage////////// reassured hold aspirin for 2 weeks

## 2017-10-22 NOTE — Patient Instructions (Signed)
Hold the aspirin for 2 weeks  Ice packs 10-15 minutes 3-4 times daily  Return when necessary

## 2017-10-22 NOTE — Telephone Encounter (Signed)
Noted  

## 2017-10-22 NOTE — Telephone Encounter (Signed)
Pt reports "blood collection" right eye area, beginning at tear duct, down side of nose to top of cheek bone, "extending about an inch, dark red in color, almost purplish." Denies injury. No redness to sclera, no swelling, no eye drainage..."puffy under the skin."  Denies itching, pain, dizziness,chest pain, fever, no other symptoms. Pt takes  ASA 81 mg QD. Extensive cardiac history. Appt made for today with Dr. Tawanna Coolerodd.Care advice given per protocol. Instructed to call back if symptoms worsen. Reason for Disposition . MODERATE-SEVERE eyelid swelling on one side  (Exception: due to a mosquito bite)  Answer Assessment - Initial Assessment Questions 1. ONSET: "When did the swelling start?" (e.g., minutes, hours, days)     This morning 2. LOCATION: "What part of the eyelids is swollen?"    "Fluid collection"  Dark red "purplish"  from tear duct right eye, down side of nose, under eye, top of cheek bone 3. SEVERITY: "How swollen is it?"     none 4. ITCHING: "Is there any itching?" If so, ask: "How much?"   (Scale 1-10; mild, moderate or severe)     none 5. PAIN: "Is the swelling painful to touch?" If so, ask: "How painful is it?"   (Scale 1-10; mild, moderate or severe)     no 6. FEVER: "Do you have a fever?" If so, ask: "What is it, how was it measured, and when did it start?"      no 7. CAUSE: "What do you think is causing the swelling?"     unsure 8. RECURRENT SYMPTOM: "Have you had eyelid swelling before?" If so, ask: "When was the last time?" "What happened that time?"     no 9. OTHER SYMPTOMS: "Do you have any other symptoms?" (e.g., blurred vision, eye discharge, rash, runny nose)     no  Protocols used: [id number]

## 2017-12-12 ENCOUNTER — Other Ambulatory Visit: Payer: Self-pay | Admitting: Adult Health

## 2017-12-12 NOTE — Telephone Encounter (Signed)
Sent to the pharmacy by e-scribe. 

## 2017-12-30 ENCOUNTER — Observation Stay (HOSPITAL_COMMUNITY)
Admission: EM | Admit: 2017-12-30 | Discharge: 2018-01-01 | Disposition: A | Discharge status: Home / Self Care | Payer: Medicare HMO | Attending: Internal Medicine | Admitting: Internal Medicine

## 2017-12-30 ENCOUNTER — Emergency Department (HOSPITAL_COMMUNITY): Payer: Medicare HMO

## 2017-12-30 ENCOUNTER — Other Ambulatory Visit: Payer: Self-pay

## 2017-12-30 ENCOUNTER — Encounter (HOSPITAL_COMMUNITY): Payer: Self-pay

## 2017-12-30 DIAGNOSIS — R42 Dizziness and giddiness: Secondary | ICD-10-CM

## 2017-12-30 DIAGNOSIS — I5032 Chronic diastolic (congestive) heart failure: Secondary | ICD-10-CM | POA: Diagnosis not present

## 2017-12-30 DIAGNOSIS — E039 Hypothyroidism, unspecified: Secondary | ICD-10-CM | POA: Insufficient documentation

## 2017-12-30 DIAGNOSIS — Z955 Presence of coronary angioplasty implant and graft: Secondary | ICD-10-CM | POA: Diagnosis not present

## 2017-12-30 DIAGNOSIS — Z87891 Personal history of nicotine dependence: Secondary | ICD-10-CM | POA: Insufficient documentation

## 2017-12-30 DIAGNOSIS — E785 Hyperlipidemia, unspecified: Secondary | ICD-10-CM | POA: Insufficient documentation

## 2017-12-30 DIAGNOSIS — H81399 Other peripheral vertigo, unspecified ear: Principal | ICD-10-CM | POA: Insufficient documentation

## 2017-12-30 DIAGNOSIS — I110 Hypertensive heart disease with heart failure: Secondary | ICD-10-CM | POA: Diagnosis not present

## 2017-12-30 DIAGNOSIS — I2510 Atherosclerotic heart disease of native coronary artery without angina pectoris: Secondary | ICD-10-CM | POA: Diagnosis not present

## 2017-12-30 DIAGNOSIS — R4789 Other speech disturbances: Secondary | ICD-10-CM | POA: Diagnosis not present

## 2017-12-30 DIAGNOSIS — Z79899 Other long term (current) drug therapy: Secondary | ICD-10-CM | POA: Diagnosis not present

## 2017-12-30 DIAGNOSIS — R531 Weakness: Secondary | ICD-10-CM | POA: Diagnosis not present

## 2017-12-30 DIAGNOSIS — R4701 Aphasia: Secondary | ICD-10-CM | POA: Diagnosis not present

## 2017-12-30 DIAGNOSIS — R404 Transient alteration of awareness: Secondary | ICD-10-CM | POA: Diagnosis not present

## 2017-12-30 LAB — COMPREHENSIVE METABOLIC PANEL
ALBUMIN: 3.7 g/dL (ref 3.5–5.0)
ALT: 16 U/L (ref 14–54)
AST: 17 U/L (ref 15–41)
Alkaline Phosphatase: 48 U/L (ref 38–126)
Anion gap: 10 (ref 5–15)
BUN: 14 mg/dL (ref 6–20)
CHLORIDE: 95 mmol/L — AB (ref 101–111)
CO2: 25 mmol/L (ref 22–32)
CREATININE: 0.74 mg/dL (ref 0.44–1.00)
Calcium: 9.4 mg/dL (ref 8.9–10.3)
GFR calc Af Amer: >60 mL/min (ref >60)
GLUCOSE: 94 mg/dL (ref 65–99)
POTASSIUM: 3.6 mmol/L (ref 3.5–5.1)
Sodium: 130 mmol/L — ABNORMAL LOW (ref 135–145)
Total Bilirubin: 0.8 mg/dL (ref 0.3–1.2)
Total Protein: 6.4 g/dL — ABNORMAL LOW (ref 6.5–8.1)

## 2017-12-30 LAB — URINALYSIS, ROUTINE W REFLEX MICROSCOPIC
Bilirubin Urine: NEGATIVE
GLUCOSE, UA: NEGATIVE mg/dL
HGB URINE DIPSTICK: NEGATIVE
KETONES UR: 5 mg/dL — AB
LEUKOCYTES UA: NEGATIVE
Nitrite: NEGATIVE
PROTEIN: NEGATIVE mg/dL
Specific Gravity, Urine: 1.006 (ref 1.005–1.030)
pH: 7 (ref 5.0–8.0)

## 2017-12-30 LAB — CBC WITH DIFFERENTIAL/PLATELET
Basophils Absolute: 0 10*3/uL (ref 0.0–0.1)
Basophils Relative: 1 %
EOS PCT: 2 %
Eosinophils Absolute: 0.1 10*3/uL (ref 0.0–0.7)
HEMATOCRIT: 37.7 % (ref 36.0–46.0)
Hemoglobin: 12.6 g/dL (ref 12.0–15.0)
LYMPHS ABS: 1 10*3/uL (ref 0.7–4.0)
LYMPHS PCT: 20 %
MCH: 32.3 pg (ref 26.0–34.0)
MCHC: 33.4 g/dL (ref 30.0–36.0)
MCV: 96.7 fL (ref 78.0–100.0)
MONO ABS: 0.6 10*3/uL (ref 0.1–1.0)
MONOS PCT: 11 %
NEUTROS ABS: 3.4 10*3/uL (ref 1.7–7.7)
Neutrophils Relative %: 66 %
PLATELETS: 349 10*3/uL (ref 150–400)
RBC: 3.9 MIL/uL (ref 3.87–5.11)
RDW: 12.9 % (ref 11.5–15.5)
WBC: 5.1 10*3/uL (ref 4.0–10.5)

## 2017-12-30 LAB — TROPONIN I: Troponin I: <0.03 ng/mL (ref <0.03)

## 2017-12-30 MED ORDER — SODIUM CHLORIDE 0.9 % IV BOLUS
1000.0000 mL | Freq: Once | INTRAVENOUS | Status: AC
  Administered 2017-12-30: 1000 mL via INTRAVENOUS

## 2017-12-30 MED ORDER — MECLIZINE HCL 25 MG PO TABS
12.5000 mg | ORAL_TABLET | Freq: Once | ORAL | Status: AC
  Administered 2017-12-30: 12.5 mg via ORAL
  Filled 2017-12-30 (×2): qty 1

## 2017-12-30 MED ORDER — LORAZEPAM 2 MG/ML IJ SOLN
0.5000 mg | Freq: Once | INTRAMUSCULAR | Status: AC
  Administered 2017-12-30: 0.5 mg via INTRAVENOUS
  Filled 2017-12-30: qty 1

## 2017-12-30 NOTE — ED Triage Notes (Signed)
Pt brought in by [facility] from home for dizziness x2 days and expressive aphasia since 1200 this afternoon. Pt has hx of vertigo, states it feels similar "but has never been this bad". Pt A+Ox4, answering questions appropriately but with difficulty. EDP at bedside, pt in NAD at this time.

## 2017-12-30 NOTE — ED Provider Notes (Signed)
[facility] EMERGENCY DEPARTMENT Provider Note   CSN: [id number] Arrival date & time: 12/30/17  1817     History   Chief Complaint Chief Complaint  Patient presents with  . Dizziness    HPI Deborah [name] is a 84 y.o. female.  Pt presents to the ED today with dizziness that started yesterday morning.  Pt has a hx of vertigo, but nothing this bad.  It usually goes away after a nap.  Around 12 noon, she started having difficulty speaking.  She was also having some "shaking spells."  The pt is awake during these spells.  Pt denies any pain.  Husband said he had to carry her to the bathroom as she was too weak to walk.     Past Medical History:  Diagnosis Date  . Anxiety   . Bradycardia   . CAD S/P percutaneous coronary angioplasty 11/2014   DES PCI to RCA. Cath was done for pre-operative evaluation for mitral repair; LM 30%, LAD 50%, RCA 90% (Promus DES)  . Chronic diastolic heart failure due to valvular disease ([place]) 2015   Related to severe mitral regurgitation. Normal EF by echo.  . Diverticulitis   . Essential hypertension   . H/O: upper GI bleed 12/03/2014   While on ASA/Plavix & Warfarin --> Hct dropped to 18%; small AVM noted but no overt pathology.  [name] History of uterine cancer 2001   Status post hysterectomy  . Hyperlipidemia with target LDL less than 70    Coronary disease and thoracic aortic atheroma  . Hypothyroidism    On Levothyroxine  . Paroxysmal atrial fibrillation ([place]) 2014   Associated with sick sinus syndrome. -- Intolerant of beta blockers and diltiazem. Rate control with digoxin. Not on anticoagulation despite CHA2DS2Vasc 6  2/2 prior severe GI bleed on triple therapy  . PFO (patent foramen ovale)    Noted on TEE - L-R shunt (elsewhere reported it as a ASD)  . Severe mitral regurgitation by prior echocardiogram 09/2014   Seen on TEE to have severe MR with multiple jets. Vena contracted 0.8; Consideration had been ? Mitral E-Clip - put  on hold after GI Bleed.  . Sick sinus syndrome ([place])    Status post Loop Recorder: Medtr[employer] Linq -- most recent interrogation 05/19/2016: 2 new pauses. One greater than 3 seconds at 0 320 the morning. Second was 4.4 seconds at 9:56 PM; also noted to have an episode of A. fib. --> Recommended further follow-up upon arrival to [facility].  . Thoracic aortic atherosclerosis ([place]) 09/2014   Moderate grade 3-4 atheroma noted on TEE  . Urinary tract infection   . Vasovagal syncope    Exacerbated by bradycardia, sick sinus syndrome, and mitral regurgitation.  . Vertigo, central, unspecified laterality    Chronic dizziness.    Patient Active Problem List   Diagnosis Date Noted  . Bleeding into the skin 10/22/2017  . Acute diastolic CHF (congestive heart failure) ([place]) 07/03/2017  . Tremor 07/03/2017  . Near syncope 07/03/2017  . Vasovagal syncope   . Hyperlipidemia with target LDL less than 70   . SSS (sick sinus syndrome) ([place]) 07/31/2016  . CAD S/P DES PCI to RCA 07/31/2016  . Anemia 07/31/2016  . Essential hypertension 07/13/2016  . Hypothyroidism 07/13/2016  . Coronary artery disease involving native heart without angina pectoris 12/02/2014  . Mitral regurgitation and aortic stenosis 09/30/2014  . Thoracic aortic atherosclerosis ([place]) 09/18/2014  . Chronic diastolic heart failure due to valvular disease ([place])  09/18/2013  . Paroxysmal atrial fibrillation ([place]): CHA2DS2Vasc = 6; Not currently on [facility]. 09/18/2012    Past Surgical History:  Procedure Laterality Date  . ABDOMINAL HYSTERECTOMY  2001  . Cataract Surgery  Bilateral   . COLONOSCOPY W/ BIOPSIES  2006   2 polyps noted along with diverticulitis  . CORONARY ANGIOPLASTY WITH STENT PLACEMENT  11/2014   [facility]: 90% RCA - PCI with Promus DES.  [name] implanted cardiac monitor     . LOOP RECORDER INSERTION  07/24/2013   Medtronic Linq - to evaluate SSS for symptomatic bradycardia  .  Right and Left Heart Catheterization  11/2014   RHC: RAP 4, RVP 32/4, PA peak 29/7/16. LVEDP 18.  CORS: 30% LM, 50% LAD, 90% RCA --> PCI (Promus DES)  . TEE WITHOUT CARDIOVERSION  09/2014   Normal LV function. Severe MR with multiple jets. Vena contractile 0.8.  PFO with L-R shunt; moderate grade 3-4 aortic atheroma  . TONSILLECTOMY    . TRANSTHORACIC ECHOCARDIOGRAM  07/2013   Severe left atrial enlargement (volume 57 mL). Normal EF. Moderate TR. ASD. Moderate MR. No pulmonary hypertension.  . TRANSTHORACIC ECHOCARDIOGRAM  08/2014   Normal LV function with biatrial enlargement. Moderate-severe mitral and tricuspid insufficiency.     OB History   None      Home Medications    Prior to Admission medications   Medication Sig Start Date End Date Taking? Authorizing Provider  acetaminophen (TYLENOL 8 HOUR ARTHRITIS PAIN) 650 MG CR tablet Take 1,300 mg by mouth 2 (two) times daily.    Yes [provider]  aspirin EC 81 MG tablet Take 81 mg by mouth daily.   Yes [provider]  Carboxymethylcellul-Glycerin (REFRESH OPTIVE OP) Place 1 drop into both eyes daily as needed (dry eyes).   Yes [provider]  ferrous sulfate 325 (65 FE) MG tablet Take 325 mg by mouth 2 (two) times daily with a meal.    Yes [provider]  fluticasone (FLONASE) 50 MCG/ACT nasal spray Place 2 sprays into both nostrils daily as needed for allergies or rhinitis.    Yes [provider]  furosemide (LASIX) 20 MG tablet TAKE 1 TABLET BY MOUTH EVERY OTHER DAY. Patient taking differently: TAKE 1 TABLET (20 MG)  BY MOUTH EVERY OTHER DAY. 12/12/17  Yes Nafziger, Tommi Rumps, NP  levothyroxine (SYNTHROID, LEVOTHROID) 75 MCG tablet TAKE 1 TABLET BY MOUTH ONCE A DAY BEFORE BREAKFAST Patient taking differently: TAKE 1 TABLET (75 MCG) BY MOUTH ONCE A DAY BEFORE BREAKFAST 12/12/17  Yes Nafziger, Tommi Rumps, NP  losartan (COZAAR) 25 MG tablet TAKE 1 TABLET BY MOUTH DAILY. Patient taking differently:  TAKE 1 TABLET (25 MG) BY MOUTH DAILY. 12/12/17  Yes Nafziger, Tommi Rumps, NP  meloxicam (MOBIC) 7.5 MG tablet Take 1 tablet (7.5 mg total) by mouth daily. Patient taking differently: Take 7.5 mg by mouth daily as needed for pain.  08/07/17  Yes Nafziger, Tommi Rumps, NP  omeprazole (PRILOSEC) 20 MG capsule TAKE 1 CAPSULE  BY MOUTH DAILY. Patient taking differently: TAKE 1 CAPSULE (20 MG) BY MOUTH DAILY. 12/12/17  Yes Nafziger, Tommi Rumps, NP  polyethylene glycol (MIRALAX / GLYCOLAX) packet Take 8.5 g by mouth See admin instructions. Mix 1/2 scoop (8.5 g) in 8 oz orange juice and drink daily   Yes [provider]  potassium chloride (K-DUR,KLOR-CON) 10 MEQ tablet TAKE 1 TABLET BY MOUTH TWICE A DAY Patient taking differently: TAKE 1 TABLET (10 MEQ) BY MOUTH TWICE A DAY  12/12/17  Yes Nafziger, Tommi Rumps, NP    Family History Family History  Problem Relation Age of Onset  . Stomach cancer Father   . Cancer Brother   . Diabetes Brother   . Cancer Daughter     Social History Social History   Tobacco Use  . Smoking status: Former Smoker    Packs/day: 0.50    Years: 5.00    Pack years: 2.50    Types: Cigarettes    Last attempt to quit: 08/02/1970    Years since quitting: 47.4  . Smokeless tobacco: Never Used  Substance Use Topics  . Alcohol use: Yes    Comment: "wine occasionally"  . Drug use: No     Allergies   Carvedilol; Diltiazem; and Lopressor [metoprolol]   Review of Systems Review of Systems  Neurological: Positive for dizziness, speech difficulty and weakness.  All other systems reviewed and are negative.    Physical Exam Updated Vital Signs BP (!) 160/75   Pulse (!) 118   Temp 98.1 F (36.7 C) (Oral)   Resp (!) 21   Ht 5' 5"  (1.651 m)   Wt 56.7 kg (125 lb)   LMP  (LMP Unknown)   SpO2 98%   BMI 20.80 kg/m   Physical Exam  Constitutional: She is oriented to person, place, and time. She appears well-developed and well-nourished.  HENT:  Head: Normocephalic and  atraumatic.  Right Ear: External ear normal.  Left Ear: External ear normal.  Nose: Nose normal.  Mouth/Throat: Oropharynx is clear and moist.  Eyes: Pupils are equal, round, and reactive to light. Conjunctivae and EOM are normal.  Neck: Normal range of motion. Neck supple.  Cardiovascular: Normal rate, regular rhythm, normal heart sounds and intact distal pulses.  Pulmonary/Chest: Effort normal and breath sounds normal.  Abdominal: Soft. Bowel sounds are normal.  Musculoskeletal: Normal range of motion.  Neurological: She is alert and oriented to person, place, and time.  Some expressive aphasia  Skin: Skin is warm. Capillary refill takes less than 2 seconds.  Psychiatric: She has a normal mood and affect. Her behavior is normal. Judgment and thought content normal.  Nursing note and vitals reviewed.    ED Treatments / Results  Labs (all labs ordered are listed, but only abnormal results are displayed) Labs Reviewed  COMPREHENSIVE METABOLIC PANEL - Abnormal; Notable for the following components:      Result Value   Sodium 130 (*)    Chloride 95 (*)    Total Protein 6.4 (*)    All other components within normal limits  URINALYSIS, ROUTINE W REFLEX MICROSCOPIC - Abnormal; Notable for the following components:   Color, Urine STRAW (*)    Ketones, ur 5 (*)    All other components within normal limits  TROPONIN I  CBC WITH DIFFERENTIAL/PLATELET    EKG EKG Interpretation  Date/Time:  Sunday December 30 2017 18:40:38 EDT Ventricular Rate:  61 PR Interval:    QRS Duration: 82 QT Interval:  425 QTC Calculation: 429 R Axis:   -39 Text Interpretation:  Sinus rhythm Atrial premature complex Probable left ventricular hypertrophy No significant change since last tracing Confirmed by Isla Pence [phone]) on 12/30/2017 6:47:41 PM   Radiology Dg Chest 2 View  Result Date: 12/30/2017 CLINICAL DATA:  Dizziness, weakness EXAM: CHEST - 2 VIEW COMPARISON:  07/03/2017 FINDINGS:  Increased interstitial markings. No focal consolidation. No pleural effusion or pneumothorax. The heart is normal in size. Degenerative changes of the visualized thoracolumbar spine. Exaggerated thoracic  kyphosis. Two lower thoracic compression fracture deformities, unchanged. IMPRESSION: No evidence of acute cardiopulmonary disease. Electronically Signed   By: Julian Hy M.D.   On: 12/30/2017 19:43   Ct Head Wo Contrast  Result Date: 12/30/2017 CLINICAL DATA:  Expressive aphasia and dizziness. EXAM: CT HEAD WITHOUT CONTRAST TECHNIQUE: Contiguous axial images were obtained from the base of the skull through the vertex without intravenous contrast. COMPARISON:  None. FINDINGS: Brain: There is moderate diffuse atrophy with atrophy greatest in the frontal and temporal lobes bilaterally. There is mild invagination of CSF into the sella. There is no intracranial mass, hemorrhage, extra-axial fluid collection, or midline shift. There is small vessel disease throughout the centra semiovale bilaterally. Small vessel disease is also noted in each internal and external capsule region, as well as in the thalamus regions bilaterally. There is a prior small lacunar infarct in the right thalamus. No acute infarct evident. Vascular: There is no appreciable hyperdense vessel evident. There is calcification in each carotid siphon region as well as in the left distal vertebral artery. Skull: The bones are diffusely osteoporotic. There is a large apparent venous [place] in the posterior left occipital region with cortical thinning in this area. There are small lucent areas throughout the bony calvarium which do suggest the possibility of underlying multiple myeloma. Sinuses/Orbits: There is mucosal thickening in several ethmoid air cells. There is opacification in the posterior aspect of a hypoplastic left sphenoid sinus. Orbits appear symmetric bilaterally. Other: Mastoid air cells are clear. IMPRESSION: 1. Atrophy with  supratentorial small vessel disease. No acute infarct evident. No intracranial mass or hemorrhage. 2. Bones diffusely osteoporotic. Prominent presumed venous [place] in the medial left occipital bone. Several small lucencies are noted throughout the calvarium. The possibility of underlying multiple myeloma must be of concern given this appearance. Correlation with serum electrophoresis in this regard advised. 3.  Foci of arterial vascular calcification noted. 4.  Areas of paranasal sinus disease noted. Electronically Signed   By: Lowella Grip III M.D.   On: 12/30/2017 21:40    Procedures Procedures (including critical care time)  Medications Ordered in ED Medications  sodium chloride 0.9 % bolus 1,000 mL (0 mLs Intravenous Stopped 12/30/17 2005)  meclizine (ANTIVERT) tablet 12.5 mg (12.5 mg Oral Given 12/30/17 2005)  LORazepam (ATIVAN) injection 0.5 mg (0.5 mg Intravenous Given 12/30/17 1859)     Initial Impression / Assessment and Plan / ED Course  I have reviewed the triage vital signs and the nursing notes.  Pertinent labs & imaging results that were available during my care of the patient were reviewed by me and considered in my medical decision making (see chart for details).   Pt was feeling better and so we got pt up to ambulate her.  She was ok initially, then became very dizzy.  She was put back in bed and fell asleep.  As the dizziness has not resolved, we will get a MRI to eval for CVA.  Pt signed out to Dr. Leonides Schanz.   Final Clinical Impressions(s) / ED Diagnoses   Final diagnoses:  Dizziness    ED Discharge Orders    None       Isla Pence, MD 12/30/17 2358

## 2017-12-30 NOTE — ED Notes (Signed)
Ambulated Pt in Hallway without O2 while on Pulse Ox. Pt started at 98% and Pt stayed between 97%-100% while ambulating. Pt then walked to the bathroom and while in the bathroom pt started to feel very dizzy. Took Pt back to her room in a wheelchair. Placed pt back in bed. Pt's Pulse Ox was still reading 100%. Pt was still feeling very dizzy in the bed.

## 2017-12-30 NOTE — ED Notes (Signed)
Pt taken to CT.

## 2017-12-30 NOTE — ED Notes (Signed)
Placed pt on bedpan. Pt unable to use the Purewick.

## 2017-12-30 NOTE — ED Notes (Addendum)
Pt taken to xray 

## 2017-12-31 ENCOUNTER — Emergency Department (HOSPITAL_COMMUNITY): Payer: Medicare HMO

## 2017-12-31 DIAGNOSIS — Z955 Presence of coronary angioplasty implant and graft: Secondary | ICD-10-CM | POA: Diagnosis not present

## 2017-12-31 DIAGNOSIS — I2510 Atherosclerotic heart disease of native coronary artery without angina pectoris: Secondary | ICD-10-CM | POA: Diagnosis not present

## 2017-12-31 DIAGNOSIS — I5032 Chronic diastolic (congestive) heart failure: Secondary | ICD-10-CM | POA: Diagnosis not present

## 2017-12-31 DIAGNOSIS — R42 Dizziness and giddiness: Secondary | ICD-10-CM | POA: Diagnosis not present

## 2017-12-31 DIAGNOSIS — Z87891 Personal history of nicotine dependence: Secondary | ICD-10-CM | POA: Diagnosis not present

## 2017-12-31 DIAGNOSIS — I110 Hypertensive heart disease with heart failure: Secondary | ICD-10-CM | POA: Diagnosis not present

## 2017-12-31 DIAGNOSIS — E039 Hypothyroidism, unspecified: Secondary | ICD-10-CM | POA: Diagnosis not present

## 2017-12-31 DIAGNOSIS — H81399 Other peripheral vertigo, unspecified ear: Secondary | ICD-10-CM

## 2017-12-31 DIAGNOSIS — R4789 Other speech disturbances: Secondary | ICD-10-CM | POA: Diagnosis not present

## 2017-12-31 DIAGNOSIS — E785 Hyperlipidemia, unspecified: Secondary | ICD-10-CM | POA: Diagnosis not present

## 2017-12-31 DIAGNOSIS — Z79899 Other long term (current) drug therapy: Secondary | ICD-10-CM | POA: Diagnosis not present

## 2017-12-31 LAB — COMPREHENSIVE METABOLIC PANEL
ALBUMIN: 3.7 g/dL (ref 3.5–5.0)
ALT: 14 U/L (ref 14–54)
ANION GAP: 12 (ref 5–15)
AST: 19 U/L (ref 15–41)
Alkaline Phosphatase: 42 U/L (ref 38–126)
BUN: 12 mg/dL (ref 6–20)
CHLORIDE: 98 mmol/L — AB (ref 101–111)
CO2: 22 mmol/L (ref 22–32)
Calcium: 8.8 mg/dL — ABNORMAL LOW (ref 8.9–10.3)
Creatinine, Ser: 0.67 mg/dL (ref 0.44–1.00)
GFR calc Af Amer: >60 mL/min (ref >60)
GFR calc non Af Amer: >60 mL/min (ref >60)
GLUCOSE: 84 mg/dL (ref 65–99)
POTASSIUM: 3.7 mmol/L (ref 3.5–5.1)
SODIUM: 132 mmol/L — AB (ref 135–145)
TOTAL PROTEIN: 6.1 g/dL — AB (ref 6.5–8.1)
Total Bilirubin: 1.1 mg/dL (ref 0.3–1.2)

## 2017-12-31 LAB — TSH: TSH: 5.171 u[IU]/mL — AB (ref 0.350–4.500)

## 2017-12-31 LAB — CBC
HEMATOCRIT: 40.9 % (ref 36.0–46.0)
Hemoglobin: 13.5 g/dL (ref 12.0–15.0)
MCH: 32.5 pg (ref 26.0–34.0)
MCHC: 33 g/dL (ref 30.0–36.0)
MCV: 98.3 fL (ref 78.0–100.0)
Platelets: 359 10*3/uL (ref 150–400)
RBC: 4.16 MIL/uL (ref 3.87–5.11)
RDW: 13.1 % (ref 11.5–15.5)
WBC: 5.5 10*3/uL (ref 4.0–10.5)

## 2017-12-31 LAB — OSMOLALITY: Osmolality: 282 mOsm/kg (ref 275–295)

## 2017-12-31 MED ORDER — ONDANSETRON HCL 4 MG/2ML IJ SOLN: 4.0000 mg | Freq: Four times a day (QID) | INTRAMUSCULAR | Status: DC | PRN

## 2017-12-31 MED ORDER — GADOBENATE DIMEGLUMINE 529 MG/ML IV SOLN
11.0000 mL | Freq: Once | INTRAVENOUS | Status: AC | PRN
  Administered 2017-12-31: 11 mL via INTRAVENOUS

## 2017-12-31 MED ORDER — POTASSIUM CHLORIDE CRYS ER 10 MEQ PO TBCR
10.0000 meq | EXTENDED_RELEASE_TABLET | Freq: Two times a day (BID) | ORAL | Status: DC
  Administered 2017-12-31 – 2018-01-01 (×3): 10 meq via ORAL
  Filled 2017-12-31 (×3): qty 1

## 2017-12-31 MED ORDER — LORAZEPAM 2 MG/ML IJ SOLN
0.5000 mg | Freq: Once | INTRAMUSCULAR | Status: AC
  Administered 2017-12-31: 0.5 mg via INTRAVENOUS
  Filled 2017-12-31: qty 1

## 2017-12-31 MED ORDER — ONDANSETRON HCL 4 MG PO TABS: 4.0000 mg | ORAL_TABLET | Freq: Four times a day (QID) | ORAL | Status: DC | PRN

## 2017-12-31 MED ORDER — FERROUS SULFATE 325 (65 FE) MG PO TABS
325.0000 mg | ORAL_TABLET | Freq: Two times a day (BID) | ORAL | Status: DC
  Administered 2017-12-31 – 2018-01-01 (×3): 325 mg via ORAL
  Filled 2017-12-31 (×3): qty 1

## 2017-12-31 MED ORDER — ACETAMINOPHEN 500 MG PO TABS
1000.0000 mg | ORAL_TABLET | Freq: Two times a day (BID) | ORAL | Status: DC
  Administered 2017-12-31 – 2018-01-01 (×3): 1000 mg via ORAL
  Filled 2017-12-31 (×4): qty 2

## 2017-12-31 MED ORDER — ASPIRIN EC 81 MG PO TBEC
81.0000 mg | DELAYED_RELEASE_TABLET | Freq: Every day | ORAL | Status: DC
  Administered 2017-12-31 – 2018-01-01 (×2): 81 mg via ORAL
  Filled 2017-12-31 (×2): qty 1

## 2017-12-31 MED ORDER — LOSARTAN POTASSIUM 50 MG PO TABS
25.0000 mg | ORAL_TABLET | Freq: Every day | ORAL | Status: DC
  Administered 2017-12-31 – 2018-01-01 (×2): 25 mg via ORAL
  Filled 2017-12-31 (×2): qty 1

## 2017-12-31 MED ORDER — SODIUM CHLORIDE 0.9 % IV SOLN
INTRAVENOUS | Status: DC
  Administered 2017-12-31: 09:00:00 via INTRAVENOUS

## 2017-12-31 MED ORDER — PANTOPRAZOLE SODIUM 40 MG PO TBEC
40.0000 mg | DELAYED_RELEASE_TABLET | Freq: Every day | ORAL | Status: DC
  Administered 2017-12-31 – 2018-01-01 (×2): 40 mg via ORAL
  Filled 2017-12-31 (×2): qty 1

## 2017-12-31 MED ORDER — FUROSEMIDE 20 MG PO TABS
20.0000 mg | ORAL_TABLET | ORAL | Status: DC
  Administered 2018-01-01: 20 mg via ORAL
  Filled 2017-12-31: qty 1

## 2017-12-31 MED ORDER — ENOXAPARIN SODIUM 30 MG/0.3ML ~~LOC~~ SOLN
30.0000 mg | SUBCUTANEOUS | Status: DC
  Administered 2017-12-31 – 2018-01-01 (×2): 30 mg via SUBCUTANEOUS
  Filled 2017-12-31 (×2): qty 0.3

## 2017-12-31 MED ORDER — MECLIZINE HCL 12.5 MG PO TABS: 25.0000 mg | ORAL_TABLET | Freq: Three times a day (TID) | ORAL | Status: DC | PRN

## 2017-12-31 MED ORDER — LEVOTHYROXINE SODIUM 75 MCG PO TABS
75.0000 ug | ORAL_TABLET | Freq: Every day | ORAL | Status: DC
  Administered 2017-12-31 – 2018-01-01 (×2): 75 ug via ORAL
  Filled 2017-12-31 (×2): qty 1

## 2017-12-31 NOTE — ED Notes (Signed)
Pt went to MRI.

## 2017-12-31 NOTE — ED Notes (Signed)
Pt ambulated to restroom with use of dinamap as a walker. Pt gait able to ambulate to restroom with assistance. While in bathroom, pt became more dizzy and unsteady when transitioning to wheelchair.

## 2017-12-31 NOTE — H&P (Signed)
TRH H&P    Patient Demographics:    Deborah [name], is a 84 y.o. female  MRN: [id number]  DOB - Sep 11, 1931  Admit Date - 12/30/2017  Referring MD/NP/PA: Dr Leonides Schanz  Outpatient Primary MD for the patient is Dorothyann Peng, NP  Patient coming from: Home  Chief complaint-dizziness   HPI:    Deborah [name]  is a 84 y.o. female, with history of CAD status post DES to RCA, bradycardia, diastolic heart failure, hypertension, hypothyroidism, paroxysmal atrial fibrillation, not on anti-Coblation due to GI bleed, severe mitral regurgitation came to hospital with complaints of dizziness.  Patient does have history of vertigo and has required meclizine as needed. CT head and MRI brain were negative for acute stroke. Patient continued to have dizziness so we will replace and observation for symptom management. Patient was given Ativan and is very somnolent at this time.  History obtained from patient's husband at bedside. No history of chest pain or shortness of breath. Did have nausea but no vomiting. No diarrhea. No previous history of stroke or seizures. No fever or chills.    Review of systems:      All other systems reviewed and are negative.   With Past History of the following :    Past Medical History:  Diagnosis Date  . Anxiety   . Bradycardia   . CAD S/P percutaneous coronary angioplasty 11/2014   DES PCI to RCA. Cath was done for pre-operative evaluation for mitral repair; LM 30%, LAD 50%, RCA 90% (Promus DES)  . Chronic diastolic heart failure due to valvular disease ([place]) 2015   Related to severe mitral regurgitation. Normal EF by echo.  . Diverticulitis   . Essential hypertension   . H/O: upper GI bleed 12/03/2014   While on ASA/Plavix & Warfarin --> Hct dropped to 18%; small AVM noted but no overt pathology.  [name] History of uterine cancer 2001   Status post hysterectomy  . Hyperlipidemia with  target LDL less than 70    Coronary disease and thoracic aortic atheroma  . Hypothyroidism    On Levothyroxine  . Paroxysmal atrial fibrillation ([place]) 2014   Associated with sick sinus syndrome. -- Intolerant of beta blockers and diltiazem. Rate control with digoxin. Not on anticoagulation despite CHA2DS2Vasc 6  2/2 prior severe GI bleed on triple therapy  . PFO (patent foramen ovale)    Noted on TEE - L-R shunt (elsewhere reported it as a ASD)  . Severe mitral regurgitation by prior echocardiogram 09/2014   Seen on TEE to have severe MR with multiple jets. Vena contracted 0.8; Consideration had been ? Mitral E-Clip - put on hold after GI Bleed.  . Sick sinus syndrome ([place])    Status post Loop Recorder: [employer] Linq -- most recent interrogation 05/19/2016: 2 new pauses. One greater than 3 seconds at 0 320 the morning. Second was 4.4 seconds at 9:56 PM; also noted to have an episode of A. fib. --> Recommended further follow-up upon arrival to [facility].  . Thoracic aortic atherosclerosis ([place]) 09/2014   Moderate  grade 3-4 atheroma noted on TEE  . Urinary tract infection   . Vasovagal syncope    Exacerbated by bradycardia, sick sinus syndrome, and mitral regurgitation.  . Vertigo, central, unspecified laterality    Chronic dizziness.      Past Surgical History:  Procedure Laterality Date  . ABDOMINAL HYSTERECTOMY  2001  . Cataract Surgery  Bilateral   . COLONOSCOPY W/ BIOPSIES  2006   2 polyps noted along with diverticulitis  . CORONARY ANGIOPLASTY WITH STENT PLACEMENT  11/2014   [facility]: 90% RCA - PCI with Promus DES.  [name] implanted cardiac monitor     . LOOP RECORDER INSERTION  07/24/2013   Medtronic Linq - to evaluate SSS for symptomatic bradycardia  . Right and Left Heart Catheterization  11/2014   RHC: RAP 4, RVP 32/4, PA peak 29/7/16. LVEDP 18.  [facility]: 30% LM, 50% LAD, 90% RCA --> PCI (Promus DES)  . TEE WITHOUT CARDIOVERSION   09/2014   Normal LV function. Severe MR with multiple jets. Vena contractile 0.8.  PFO with L-R shunt; moderate grade 3-4 aortic atheroma  . TONSILLECTOMY    . TRANSTHORACIC ECHOCARDIOGRAM  07/2013   Severe left atrial enlargement (volume 57 mL). Normal EF. Moderate TR. ASD. Moderate MR. No pulmonary hypertension.  . TRANSTHORACIC ECHOCARDIOGRAM  08/2014   Normal LV function with biatrial enlargement. Moderate-severe mitral and tricuspid insufficiency.      Social History:      Social History   Tobacco Use  . Smoking status: Former Smoker    Packs/day: 0.50    Years: 5.00    Pack years: 2.50    Types: Cigarettes    Last attempt to quit: 08/02/1970    Years since quitting: 47.4  . Smokeless tobacco: Never Used  Substance Use Topics  . Alcohol use: Yes    Comment: "wine occasionally"       Family History :     Family History  Problem Relation Age of Onset  . Stomach cancer Father   . Cancer Brother   . Diabetes Brother   . Cancer Daughter       Home Medications:   Prior to Admission medications   Medication Sig Start Date End Date Taking? Authorizing Provider  acetaminophen (TYLENOL 8 HOUR ARTHRITIS PAIN) 650 MG CR tablet Take 1,300 mg by mouth 2 (two) times daily.    Yes [provider]  aspirin EC 81 MG tablet Take 81 mg by mouth daily.   Yes [provider]  Carboxymethylcellul-Glycerin (REFRESH OPTIVE OP) Place 1 drop into both eyes daily as needed (dry eyes).   Yes [provider]  ferrous sulfate 325 (65 FE) MG tablet Take 325 mg by mouth 2 (two) times daily with a meal.    Yes [provider]  fluticasone (FLONASE) 50 MCG/ACT nasal spray Place 2 sprays into both nostrils daily as needed for allergies or rhinitis.    Yes [provider]  furosemide (LASIX) 20 MG tablet TAKE 1 TABLET BY MOUTH EVERY OTHER DAY. Patient taking differently: TAKE 1 TABLET (20 MG)  BY MOUTH EVERY OTHER DAY. 12/12/17  Yes Nafziger, Tommi Rumps, NP    levothyroxine (SYNTHROID, LEVOTHROID) 75 MCG tablet TAKE 1 TABLET BY MOUTH ONCE A DAY BEFORE BREAKFAST Patient taking differently: TAKE 1 TABLET (75 MCG) BY MOUTH ONCE A DAY BEFORE BREAKFAST 12/12/17  Yes Nafziger, Tommi Rumps, NP  losartan (COZAAR) 25 MG tablet TAKE 1 TABLET BY MOUTH DAILY. Patient taking differently: TAKE  1 TABLET (25 MG) BY MOUTH DAILY. 12/12/17  Yes Nafziger, Tommi Rumps, NP  meloxicam (MOBIC) 7.5 MG tablet Take 1 tablet (7.5 mg total) by mouth daily. Patient taking differently: Take 7.5 mg by mouth daily as needed for pain.  08/07/17  Yes Nafziger, Tommi Rumps, NP  omeprazole (PRILOSEC) 20 MG capsule TAKE 1 CAPSULE  BY MOUTH DAILY. Patient taking differently: TAKE 1 CAPSULE (20 MG) BY MOUTH DAILY. 12/12/17  Yes Nafziger, Tommi Rumps, NP  polyethylene glycol (MIRALAX / GLYCOLAX) packet Take 8.5 g by mouth See admin instructions. Mix 1/2 scoop (8.5 g) in 8 oz orange juice and drink daily   Yes [provider]  potassium chloride (K-DUR,KLOR-CON) 10 MEQ tablet TAKE 1 TABLET BY MOUTH TWICE A DAY Patient taking differently: TAKE 1 TABLET (10 MEQ) BY MOUTH TWICE A DAY 12/12/17  Yes Nafziger, Tommi Rumps, NP     Allergies:     Allergies  Allergen Reactions  . Carvedilol Other (See Comments)    Bradycardia   . Diltiazem Other (See Comments)    Bradycardia  . Lopressor [Metoprolol] Other (See Comments)    Bradycardia     Physical Exam:   Vitals  Blood pressure (!) 163/95, pulse (!) 110, temperature 98.1 F (36.7 C), temperature source Oral, resp. rate 20, height 5' 5" (1.651 m), weight 56.7 kg (125 lb), SpO2 95 %.  1.  General: Somnolent but arousable  2. Psychiatric: Somnolent but arousable, oriented x3  3. Neurologic: No focal neurological deficits, all cranial nerves intact.Strength 5/5 all 4 extremities, sensation intact all 4 extremities, plantars down going.  4. Eyes :  anicteric sclerae, moist conjunctivae with no lid lag.   5. ENMT:  Oropharynx clear with moist mucous  membranes and good dentition  6. Neck:  supple, no cervical lymphadenopathy appriciated, No thyromegaly  7. Respiratory : Normal respiratory effort, good air movement bilaterally,clear to  auscultation bilaterally  8. Cardiovascular : RRR, no gallops, rubs or murmurs, no leg edema  9. Gastrointestinal:  Positive bowel sounds, abdomen soft, non-tender to palpation,no hepatosplenomegaly, no rigidity or guarding       10. Skin:  No cyanosis, normal texture and turgor, no rash, lesions or ulcers  11.Musculoskeletal:  Good muscle tone,  joints appear normal , no effusions,  normal range of motion    Data Review:    CBC Recent Labs  Lab 12/30/17 1922  WBC 5.1  HGB 12.6  HCT 37.7  PLT 349  MCV 96.7  MCH 32.3  MCHC 33.4  RDW 12.9  LYMPHSABS 1.0  MONOABS 0.6  EOSABS 0.1  BASOSABS 0.0   ------------------------------------------------------------------------------------------------------------------  Chemistries  Recent Labs  Lab 12/30/17 1922  NA 130*  K 3.6  CL 95*  CO2 25  GLUCOSE 94  BUN 14  CREATININE 0.74  CALCIUM 9.4  AST 17  ALT 16  ALKPHOS 48  BILITOT 0.8   ------------------------------------------------------------------------------------------------------------------  ------------------------------------------------------------------------------------------------------------------ GFR: Estimated Creatinine Clearance: 46.9 mL/min (by C-G formula based on SCr of 0.74 mg/dL). Liver Function Tests: Recent Labs  Lab 12/30/17 1922  AST 17  ALT 16  ALKPHOS 48  BILITOT 0.8  PROT 6.4*  ALBUMIN 3.7   No results for input(s): LIPASE, AMYLASE in the last 168 hours. No results for input(s): AMMONIA in the last 168 hours. Coagulation Profile: No results for input(s): INR, PROTIME in the last 168 hours. Cardiac Enzymes: Recent Labs  Lab 12/30/17 1922  TROPONINI <0.03   BNP (last 3 results) No results for input(s): PROBNP in the last 8760  hours. HbA1C: No results for input(s): HGBA1C in the last 72 hours. CBG: No results for input(s): GLUCAP in the last 168 hours. Lipid Profile: No results for input(s): CHOL, HDL, LDLCALC, TRIG, CHOLHDL, LDLDIRECT in the last 72 hours. Thyroid Function Tests: No results for input(s): TSH, T4TOTAL, FREET4, T3FREE, THYROIDAB in the last 72 hours. Anemia Panel: No results for input(s): VITAMINB12, FOLATE, FERRITIN, TIBC, IRON, RETICCTPCT in the last 72 hours.  --------------------------------------------------------------------------------------------------------------- Urine analysis:    Component Value Date/Time   COLORURINE STRAW (A) 12/30/2017 2132   APPEARANCEUR CLEAR 12/30/2017 2132   LABSPEC 1.006 12/30/2017 2132   PHURINE 7.0 12/30/2017 2132   GLUCOSEU NEGATIVE 12/30/2017 2132   HGBUR NEGATIVE 12/30/2017 2132   BILIRUBINUR NEGATIVE 12/30/2017 2132   KETONESUR 5 (A) 12/30/2017 2132   PROTEINUR NEGATIVE 12/30/2017 2132   NITRITE NEGATIVE 12/30/2017 2132   LEUKOCYTESUR NEGATIVE 12/30/2017 2132      Imaging Results:    Dg Chest 2 View  Result Date: 12/30/2017 CLINICAL DATA:  Dizziness, weakness EXAM: CHEST - 2 VIEW COMPARISON:  07/03/2017 FINDINGS: Increased interstitial markings. No focal consolidation. No pleural effusion or pneumothorax. The heart is normal in size. Degenerative changes of the visualized thoracolumbar spine. Exaggerated thoracic kyphosis. Two lower thoracic compression fracture deformities, unchanged. IMPRESSION: No evidence of acute cardiopulmonary disease. Electronically Signed   By: Sriyesh  Krishnan M.D.   On: 12/30/2017 19:43   Ct Head Wo Contrast  Result Date: 12/30/2017 CLINICAL DATA:  Expressive aphasia and dizziness. EXAM: CT HEAD WITHOUT CONTRAST TECHNIQUE: Contiguous axial images were obtained from the base of the skull through the vertex without intravenous contrast. COMPARISON:  None. FINDINGS: Brain: There is moderate diffuse atrophy with  atrophy greatest in the frontal and temporal lobes bilaterally. There is mild invagination of CSF into the sella. There is no intracranial mass, hemorrhage, extra-axial fluid collection, or midline shift. There is small vessel disease throughout the centra semiovale bilaterally. Small vessel disease is also noted in each internal and external capsule region, as well as in the thalamus regions bilaterally. There is a prior small lacunar infarct in the right thalamus. No acute infarct evident. Vascular: There is no appreciable hyperdense vessel evident. There is calcification in each carotid siphon region as well as in the left distal vertebral artery. Skull: The bones are diffusely osteoporotic. There is a large apparent venous [place] in the posterior left occipital region with cortical thinning in this area. There are small lucent areas throughout the bony calvarium which do suggest the possibility of underlying multiple myeloma. Sinuses/Orbits: There is mucosal thickening in several ethmoid air cells. There is opacification in the posterior aspect of a hypoplastic left sphenoid sinus. Orbits appear symmetric bilaterally. Other: Mastoid air cells are clear. IMPRESSION: 1. Atrophy with supratentorial small vessel disease. No acute infarct evident. No intracranial mass or hemorrhage. 2. Bones diffusely osteoporotic. Prominent presumed venous [place] in the medial left occipital bone. Several small lucencies are noted throughout the calvarium. The possibility of underlying multiple myeloma must be of concern given this appearance. Correlation with serum electrophoresis in this regard advised. 3.  Foci of arterial vascular calcification noted. 4.  Areas of paranasal sinus disease noted. Electronically Signed   By: William  Woodruff III M.D.   On: 12/30/2017 21:40   Mr Brain W And Wo Contrast  Result Date: 12/31/2017 CLINICAL DATA:  Dizziness since yesterday, speech difficulties and shaking spells. Suspect stroke. History  of hypertension, hyperlipidemia, vertigo. EXAM: MRI HEAD WITHOUT AND WITH CONTRAST TECHNIQUE: Multiplanar, multiecho   pulse sequences of the brain and surrounding structures were obtained without and with intravenous contrast. CONTRAST:  15m MULTIHANCE GADOBENATE DIMEGLUMINE 529 MG/ML IV SOLN COMPARISON:  CT HEAD December 30, 2017 FINDINGS: Mildly motion degraded examination. INTRACRANIAL CONTENTS: No reduced diffusion to suggest acute ischemia. Chronic microhemorrhage LEFT parietal lobe. The ventricles and sulci are normal for patient's age. No suspicious parenchymal signal, masses, mass effect. Confluent supratentorial and patchy pontine white matter FLAIR T2 hyperintensities. Prominent basal ganglia and to lesser extent thalami perivascular spaces associated with chronic small vessel ischemic disease. Old RIGHT basal ganglia lacunar infarct. No abnormal intraparenchymal or extra-axial enhancement. No abnormal extra-axial fluid collections. No extra-axial masses. Prominent cerebral spinal fluid space extending into the calvarium within posterior fossa, possible arachnoid cyst without enhancement. VASCULAR: Normal major intracranial vascular flow voids present at skull base. SKULL AND UPPER CERVICAL SPINE: No abnormal sellar expansion. No suspicious calvarial bone marrow signal; bright T1 signal compatible with osteopenia. No discrete enhancing calvarial lesion. Craniocervical junction maintained. SINUSES/ORBITS: Trace LEFT mastoid effusion. Paranasal sinus are well aerated.The included ocular globes and orbital contents are non-suspicious. Status post bilateral ocular lens implants. OTHER: None. IMPRESSION: 1. No acute intracranial process on this mildly motion degraded examination. 2. Moderate to severe chronic small vessel ischemic disease and old RIGHT basal ganglia lacunar infarct. Electronically Signed   By: CElon AlasM.D.   On: 12/31/2017 02:27    My personal review of EKG: Rhythm NSR   Assessment  & Plan:    Active Problems:   Vertigo   1. Dizziness/vertigo-likely from underlying BPPV, MRI brain is negative for stroke.  We will continue with meclizine 25 mg 3 times daily as needed.  Consult vestibular  PT. 2. Hypertension-continue Cozaar, Lasix 3. Chronic diastolic CHF-euvolemic, continue Lasix. 4. Hypothyroidism-continue Synthroid, will check TSH.  Hyponatremia 5. Hyponatremia-patient has mild hyponatremia sodium 130, will check serum osmolality.  Follow BMP in a.m.    DVT Prophylaxis-   Lovenox   AM Labs Ordered, also please review Full Orders  Family Communication: Admission, patients condition and plan of care including tests being ordered have been discussed with the patient and her husband at bedside who indicate understanding and agree with the plan and Code Status.  Code Status: Full code  Admission status: Observation  Time spent in minutes : 50 min   GOswald HillockM.D on 12/31/2017 at 4:08 AM  Between 7am to 7pm - Pager - [phone]. After 7pm go to [email or web address] - password [email or web address] - Office  [phone]

## 2017-12-31 NOTE — ED Provider Notes (Signed)
12:15 AM  Assumed care from Dr. Gilford Raid.  Patient is an 84 year old female who presented to the emergency department with vertigo.  Has history of the same but this was more severe.  Patient was feeling better but then started having vertigo again and unable to ambulate.  Labs unremarkable.  A CT of the head shows no acute intracranial abnormality but she does have lucencies noted throughout the calvarium which could be from multiple myeloma.  Plan is to obtain MRI of her brain.  Patient may need admission.  She has received IV fluids, Antivert, Ativan.  3:00 AM  Pt's MRI shows no acute abnormality.   Will attempt to re-ambulate patient.  Suspect peripheral vertigo.  3:30 AM  Pt unable to ambulate without significant assistance.  She is very unsteady on her feet.  Will discuss with medicine for admission for sx control for symptomatic vertigo.  3:41 AM Discussed patient's case with hospitalist, Dr. Darrick Meigs.  I have recommended admission and patient (and family if present) agree with this plan. Admitting physician will place admission orders.   I reviewed all nursing notes, vitals, pertinent previous records, EKGs, lab and urine results, imaging (as available).     Nataleigh Griffin, Delice Bison, DO 12/31/17 [phone]

## 2017-12-31 NOTE — Evaluation (Signed)
Physical Therapy Vestibular Evaluation Patient Details Name: Deborah [name] MRN: [id number] DOB: 1930-12-01 Today's Date: 12/31/2017   History of Present Illness  Deborah [name]  is a 84 y.o. female, with history of CAD status post DES to RCA, bradycardia, diastolic heart failure, hypertension, hypothyroidism, paroxysmal atrial fibrillation, not on anti-Coblation due to GI bleed, severe mitral regurgitation came to hospital with complaints of dizziness.   Clinical Impression  Patient presents with nonspecific vestibular signs.  She has some positive central signs such as saccades and smooth pursuits some of which can be explained by possible pre-existing dementia, and she has positive head thrust to R, but she does not have any nystagmus with head shake test or with gaze holding testing.  She is symptomatic, but not with any particular activity and she has some symptoms with modified hall pike, but not spinning.  Feel it is appropriate to search for possible medication or cardiac cause of her symptoms, but feel she can benefit from continued skilled PT in the acute setting to address mobility issues in general and to allow d/c home with follow up HHPT.     Follow Up Recommendations Home health PT;Supervision/Assistance - 24 hour    Equipment Recommendations  Rolling walker with 5" wheels(youth)    Recommendations for Other Services       Precautions / Restrictions Precautions Precautions: Fall      Mobility  Bed Mobility Overal bed mobility: Needs Assistance Bed Mobility: Supine to Sit     Supine to sit: HOB elevated;Supervision     General bed mobility comments: increased time coming up to sit, no c/o increased dizziness with this at first; assist for bilat sidelying tests for BPPV due to difficulty maintaining position and to lift legs  Transfers Overall transfer level: Needs assistance Equipment used: None Transfers: Sit to/from Stand Sit to Stand: Min guard;Supervision          General transfer comment: assist for balance/safety (but stood from toilet on her own with grabbar)  Ambulation/Gait Ambulation/Gait assistance: Min guard;Min assist   Assistive device: None;1 person hand held assist Gait Pattern/deviations: Step-through pattern;Step-to pattern;Decreased stride length     General Gait Details: hesitant when feeling symptoms of spinning, using furniture and when hesitant, offerred her HHA with improved confidence  Stairs            Wheelchair Mobility    Modified Rankin (Stroke Patients Only)       Balance Overall balance assessment: Needs assistance   Sitting balance-Leahy Scale: Good     Standing balance support: No upper extremity supported;During functional activity Standing balance-Leahy Scale: Fair Standing balance comment: performed toilet hygiene unaided                             Pertinent Vitals/Pain Pain Assessment: No/denies pain    Home Living Family/patient expects to be discharged to:: Private residence Living Arrangements: Spouse/significant other Available Help at Discharge: Family Type of Home: House Home Access: Stairs to enter Entrance Stairs-Rails: Right Entrance Stairs-Number of Steps: 4 Home Layout: One level Home Equipment: Grab bars - tub/shower;Cane - single point Additional Comments: sometimes uses cane in the house    Prior Function Level of Independence: Independent with assistive device(s)         Comments: states she cooks and cleans, but no longer drives     Hand Dominance        Extremity/Trunk Assessment   Upper Extremity Assessment Upper Extremity  Assessment: RUE deficits/detail;LUE deficits/detail RUE Deficits / Details: slowed coordination with finger to nose and pron/sup LUE Deficits / Details: slowed coordination with finger to nose and pron/sup    Lower Extremity Assessment Lower Extremity Assessment: Generalized weakness    Cervical / Trunk  Assessment Cervical / Trunk Assessment: Kyphotic  Communication   Communication: No difficulties  Cognition Arousal/Alertness: Awake/alert Behavior During Therapy: Anxious Overall Cognitive Status: No family/caregiver present to determine baseline cognitive functioning Area of Impairment: Orientation;Memory;Attention;Problem solving                 Orientation Level: Disoriented to;Place Current Attention Level: Sustained Memory: Decreased short-term memory       Problem Solving: Slow processing;Requires verbal cues        General Comments    Vestibular Assessment - 12/31/17 0001      Vestibular Assessment   General Observation  Patient describes symptoms as spinning slowly sometimes at rest or when moving, that lasts for several minutes and is associated with her trembling/or shaking.  She states she has had this before, but not to this degree.  She has no changes in vision or hearing and has not fallen recently.      Symptom Behavior   Type of Dizziness  Spinning    Frequency of Dizziness  intermittent    Duration of Dizziness  several minutes    Aggravating Factors  Spontaneous onset    Relieving Factors  Closing eyes      Occulomotor Exam   Occulomotor Alignment  Normal    Spontaneous  Absent    Gaze-induced  Absent    Head shaking Horizontal  Absent    Smooth Pursuits  Saccades    Saccades  Slow      Vestibulo-Occular Reflex   VOR 1 Head Only (x 1 viewing)  horizontal and vertical x 30 sec with some increased symptoms, but not long    VOR to Slow Head Movement  Positive right;Negative left    VOR Cancellation  Normal      Auditory   Comments  could not perform scratch test as pt unable to hear rubbing fingers, but feels they are about the same      Positional Testing   Sidelying Test  Sidelying Right;Sidelying Left    Horizontal Canal Testing  Horizontal Canal Right;Horizontal Canal Left      Sidelying Right   Sidelying Right Duration  30 sec     Sidelying Right Symptoms  No nystagmus but severe "discomfort"      Sidelying Left   Sidelying Left Duration  30 sec    Sidelying Left Symptoms  No nystagmus but mild discomfort      Horizontal Canal Right   Horizontal Canal Right Duration  30 sec    Horizontal Canal Right Symptoms  Normal      Horizontal Canal Left   Horizontal Canal Left Duration  30 sec    Horizontal Canal Left Symptoms  Normal         Exercises     Assessment/Plan    PT Assessment Patient needs continued PT services  PT Problem List Decreased coordination;Decreased mobility;Decreased safety awareness;Decreased balance;Decreased activity tolerance;Other (comment)(dizziness)       PT Treatment Interventions DME instruction;Therapeutic activities;Gait training;Therapeutic exercise;Patient/family education;Balance training;Functional mobility training    PT Goals (Current goals can be found in the Care Plan section)  Acute Rehab PT Goals Patient Stated Goal: to improve the dizziness PT Goal Formulation: With patient Time For Goal Achievement: 01/07/18  Potential to Achieve Goals: Fair    Frequency Min 3X/week   Barriers to discharge        Co-evaluation               AM-PAC PT "6 Clicks" Daily Activity  Outcome Measure Difficulty turning over in bed (including adjusting bedclothes, sheets and blankets)?: A Little Difficulty moving from lying on back to sitting on the side of the bed? : A Lot Difficulty sitting down on and standing up from a chair with arms (e.g., wheelchair, bedside commode, etc,.)?: Unable Help needed moving to and from a bed to chair (including a wheelchair)?: A Little Help needed walking in hospital room?: A Little Help needed climbing 3-5 steps with a railing? : A Lot 6 Click Score: 14    End of Session Equipment Utilized During Treatment: Gait belt Activity Tolerance: Patient tolerated treatment well Patient left: in bed;with call bell/phone within reach;with bed  alarm set   PT Visit Diagnosis: Other abnormalities of gait and mobility (R26.89);Other symptoms and signs involving the nervous system (R29.898);Dizziness and giddiness (R42)    Time: 1010-1057 PT Time Calculation (min) (ACUTE ONLY): 47 min   Charges:   PT Evaluation $PT Eval Moderate Complexity: 1 Mod PT Treatments $Gait Training: 8-22 mins $Neuromuscular Re-education: 8-22 mins   PT G CodesSheran Lawless, [place] [phone] 12/31/2017   Elray Mcgregor 12/31/2017, 4:07 PM

## 2017-12-31 NOTE — Care Management Note (Signed)
Case Management Note  Patient Details  Name: Deborah [name] MRN: [id number] Date of Birth: 1931/04/17  Subjective/Objective:      Pt in with vertigo. She is from home with her spouse.               Action/Plan: Awaiting PT/OT recommendations. CM following for d/c needs, physician orders.   Expected Discharge Date:                  Expected Discharge Plan:     In-House Referral:     Discharge planning Services     Post Acute Care Choice:    Choice offered to:     DME Arranged:    DME Agency:     HH Arranged:    HH Agency:     Status of Service:  In process, will continue to follow  If discussed at Long Length of Stay Meetings, dates discussed:    Additional Comments:  Kermit BaloKelli F Kelilah Hebard, RN 12/31/2017, 4:09 PM

## 2017-12-31 NOTE — Progress Notes (Signed)
Patient admitted after midnight, appears to be having runs of a fib on tele I added this AM (was med surge).  Per cardiology review, patient can not have BB/dig due to bradycardia and did not want pacemaker etc.  May need to revisit in the AM.  Await PT note. Marlin CanaryJessica Maelynn Moroney DO

## 2018-01-01 DIAGNOSIS — R269 Unspecified abnormalities of gait and mobility: Secondary | ICD-10-CM | POA: Diagnosis not present

## 2018-01-01 DIAGNOSIS — R42 Dizziness and giddiness: Secondary | ICD-10-CM | POA: Diagnosis not present

## 2018-01-01 DIAGNOSIS — E039 Hypothyroidism, unspecified: Secondary | ICD-10-CM

## 2018-01-01 LAB — CBC
HEMATOCRIT: 35.3 % — AB (ref 36.0–46.0)
Hemoglobin: 11.5 g/dL — ABNORMAL LOW (ref 12.0–15.0)
MCH: 31.7 pg (ref 26.0–34.0)
MCHC: 32.6 g/dL (ref 30.0–36.0)
MCV: 97.2 fL (ref 78.0–100.0)
Platelets: 330 10*3/uL (ref 150–400)
RBC: 3.63 MIL/uL — ABNORMAL LOW (ref 3.87–5.11)
RDW: 12.9 % (ref 11.5–15.5)
WBC: 4.2 10*3/uL (ref 4.0–10.5)

## 2018-01-01 LAB — VITAMIN B12: VITAMIN B 12: 325 pg/mL (ref 180–914)

## 2018-01-01 LAB — BASIC METABOLIC PANEL
Anion gap: 7 (ref 5–15)
BUN: 15 mg/dL (ref 6–20)
CALCIUM: 8.5 mg/dL — AB (ref 8.9–10.3)
CO2: 25 mmol/L (ref 22–32)
CREATININE: 0.71 mg/dL (ref 0.44–1.00)
Chloride: 102 mmol/L (ref 101–111)
GFR calc Af Amer: >60 mL/min (ref >60)
GFR calc non Af Amer: >60 mL/min (ref >60)
GLUCOSE: 97 mg/dL (ref 65–99)
Potassium: 3.9 mmol/L (ref 3.5–5.1)
Sodium: 134 mmol/L — ABNORMAL LOW (ref 135–145)

## 2018-01-01 MED ORDER — MECLIZINE HCL 25 MG PO TABS: 12.5000 mg | ORAL_TABLET | Freq: Three times a day (TID) | ORAL | 0 refills | Status: DC | PRN

## 2018-01-01 NOTE — Progress Notes (Signed)
Patient discharged home with husband .  IV and cardiac monitor discontinued.  No complaint of dizziness.  Vital sign stable.  Discharge instruction given to patient and husband.  Patient left the floor by wheelchair with husband by her side.

## 2018-01-01 NOTE — Care Management Note (Signed)
Case Management Note  Patient Details  Name: Deborah [name] MRN: [id number] Date of Birth: November 15, 1930  Subjective/Objective:                  Action/Plan: Pt [facility] home [facility] CM provided choice to patient and her spouse and they selected [facility]. [facility]up[facility]na [facility] aware and accepted the [facility]. Per Dr Benjamine MolaVann [facility] just the HH[facility].  Pt with orders for youth rolling walker. CM [facility] and he will deliver the equipment to the room.  Spouse to provide transportation home.   Expected Discharge Date:  01/01/18               Expected Discharge Plan:  Home w Home Health Services  In-House Referral:     Discharge planning Services  CM Consult  Post Acute Care Choice:  Home Health Choice offered to:  Patient, Spouse  DME Arranged:    DME Agency:     HH Arranged:  PT HH Agency:  [facility]  Status of Service:  Completed, signed off  If discussed at Long Length of Stay Meetings, dates discussed:    Additional Comments:  Kermit BaloKelli F Ashir Kunz, RN 01/01/2018, 2:17 PM

## 2018-01-01 NOTE — Plan of Care (Signed)
  Problem: Activity: Goal: Risk for activity intolerance will decrease Outcome: Progressing   Problem: Elimination: Goal: Will not experience complications related to bowel motility Outcome: Progressing   Problem: Pain Managment: Goal: General experience of comfort will improve Outcome: Progressing   

## 2018-01-01 NOTE — Progress Notes (Signed)
Physical Therapy Treatment Patient Details Name: Deborah [name] MRN: [id number] DOB: 05/17/1931 Today's Date: 01/01/2018    History of Present Illness Deborah ApaRose Trompeter  is a 84 y.o. female, with history of CAD status post DES to RCA, bradycardia, diastolic heart failure, hypertension, hypothyroidism, paroxysmal atrial fibrillation, not on anti-Coblation due to GI bleed, severe mitral regurgitation came to hospital with complaints of dizziness.     PT Comments    Patient progressing with symptoms this session and able to ambulate with walker increased distance.  Rechecked to make sure no positional vertigo and though symptomatic may be due to other cause as no nystagmus seen.  Feel pt appropriate for HHPT follow up and would need youth RW.   Follow Up Recommendations  Home health PT;Supervision/Assistance - 24 hour     Equipment Recommendations  Rolling walker with 5" wheels(youth)    Recommendations for Other Services       Precautions / Restrictions Precautions Precautions: Fall    Mobility  Bed Mobility         Supine to sit: HOB elevated;Supervision        Transfers   Equipment used: Rolling walker (2 wheeled) Transfers: Sit to/from Stand Sit to Stand: Supervision         General transfer comment: increased time  Ambulation/Gait Ambulation/Gait assistance: Supervision;Min guard Ambulation Distance (Feet): 200 Feet Assistive device: Rolling walker (2 wheeled) Gait Pattern/deviations: Step-through pattern;Decreased stride length     General Gait Details: managing walker well in hallway and reports no symptoms with ambulation.  assist initially for safety, but able to back off to S and pt stable even with head turns for environmental scanning and talking to staff in hallway   Stairs             Wheelchair Mobility    Modified Rankin (Stroke Patients Only)       Balance Overall balance assessment: Needs assistance   Sitting balance-Leahy Scale:  Good       Standing balance-Leahy Scale: Fair                              Cognition Arousal/Alertness: Awake/alert Behavior During Therapy: WFL for tasks assessed/performed Overall Cognitive Status: Impaired/Different from baseline                                 General Comments: spouse in the room correcting pt. at times with historical facts and seems to indicate this is not her baseline. reports she had worse issues yesterday and feels due to Ativan.      Exercises      General Comments General comments (skin integrity, edema, etc.): rechecked modified hall pike and pt slightly symptomatic (L side worse than R) but no nystagmus seen, educated spouse on possible reasons for symptoms including brain atrophy, cardiac issues, positve vertebral artery test, motion sensitivity or medication.       Pertinent Vitals/Pain Pain Assessment: No/denies pain    Home Living                      Prior Function            PT Goals (current goals can now be found in the care plan section) Progress towards PT goals: Progressing toward goals    Frequency    Min 3X/week      PT Plan Current plan  remains appropriate    Co-evaluation              AM-PAC PT "6 Clicks" Daily Activity  Outcome Measure  Difficulty turning over in bed (including adjusting bedclothes, sheets and blankets)?: A Little Difficulty moving from lying on back to sitting on the side of the bed? : A Little Difficulty sitting down on and standing up from a chair with arms (e.g., wheelchair, bedside commode, etc,.)?: A Lot Help needed moving to and from a bed to chair (including a wheelchair)?: A Little Help needed walking in hospital room?: A Little Help needed climbing 3-5 steps with a railing? : A Little 6 Click Score: 17    End of Session Equipment Utilized During Treatment: Gait belt Activity Tolerance: Patient tolerated treatment well Patient left: in bed;with  call bell/phone within reach;with bed alarm set;with family/visitor present   PT Visit Diagnosis: Other abnormalities of gait and mobility (R26.89);Other symptoms and signs involving the nervous system (R29.898);Dizziness and giddiness (R42)     Time: 1000-1037 PT Time Calculation (min) (ACUTE ONLY): 37 min  Charges:  $Gait Training: 8-22 mins $Self Care/Home Management: 8-22                    G CodesSheran Lawless, [place] [phone] 01/01/2018    Elray Mcgregor 01/01/2018, 11:03 AM

## 2018-01-01 NOTE — Discharge Summary (Signed)
Physician Discharge Summary  Deborah [name] YFV:[id number] DOB: 03-01-1931 DOA: 12/30/2017  PCP: Dorothyann Peng, NP  Admit date: 12/30/2017 Discharge date: 01/01/2018   Recommendations for Outpatient Follow-Up:   1. Close cardiology followup--- suspect dizziness is related to runs of uncontrolled a fib/dehydration that she does not tolerate--- has refused all interventions in past 2. Home health 3. TSH follow up   Discharge Diagnosis:   Active Problems:   Vertigo   Discharge disposition:  Home  Discharge Condition: Improved.  Diet recommendation: Regular.  Wound care: None.   History of Present Illness:   Deborah [name]  is a 84 y.o. female, with history of CAD status post DES to RCA, bradycardia, diastolic heart failure, hypertension, hypothyroidism, paroxysmal atrial fibrillation, not on anti-Coblation due to GI bleed, severe mitral regurgitation came to hospital with complaints of dizziness.  Patient does have history of vertigo and has required meclizine as needed. CT head and MRI brain were negative for acute stroke. Patient continued to have dizziness so we will replace and observation for symptom management. Patient was given Ativan and is very somnolent at this time.  History obtained from patient's husband at bedside. No history of chest pain or shortness of breath. Did have nausea but no vomiting. No diarrhea. No previous history of stroke or seizures. No fever or chills.     Hospital Course by Problem:   Dizziness -unclear etiology -seems to be related to a fib runs -not able to have BB/dig due to side effects -needs close follow up with cards although did not want TEE for MR evaluation nor pacemaker for tachy/brady  -stay hydrated -consider palliative care referral  HTN -continue home meds  Hypothyroid -recheck TSH as outpatient May need increase of dose  Hyponatremia -improved with IVF -encouraged patient to stay hydrated   Medical  Consultants:    None.   Discharge Exam:   Vitals:   01/01/18 0739 01/01/18 1155  BP: (!) 182/66 (!) 161/82  Pulse: 77 68  Resp: 16 15  Temp: 97.6 F (36.4 C) 97.8 F (36.6 C)  SpO2: 99% 97%   Vitals:   01/01/18 0020 01/01/18 0419 01/01/18 0739 01/01/18 1155  BP: 102/64 104/79 (!) 182/66 (!) 161/82  Pulse: 81 80 77 68  Resp: 19 18 16 15   Temp:  97.8 F (36.6 C) 97.6 F (36.4 C) 97.8 F (36.6 C)  TempSrc:  Oral Oral Oral  SpO2: 97% 96% 99% 97%  Weight:      Height:        Gen:  NAD    The results of significant diagnostics from this hospitalization (including imaging, microbiology, ancillary and laboratory) are listed below for reference.     Procedures and Diagnostic Studies:   Dg Chest 2 View  Result Date: 12/30/2017 CLINICAL DATA:  Dizziness, weakness EXAM: CHEST - 2 VIEW COMPARISON:  07/03/2017 FINDINGS: Increased interstitial markings. No focal consolidation. No pleural effusion or pneumothorax. The heart is normal in size. Degenerative changes of the visualized thoracolumbar spine. Exaggerated thoracic kyphosis. Two lower thoracic compression fracture deformities, unchanged. IMPRESSION: No evidence of acute cardiopulmonary disease. Electronically Signed   By: Julian Hy M.D.   On: 12/30/2017 19:43   Ct Head Wo Contrast  Result Date: 12/30/2017 CLINICAL DATA:  Expressive aphasia and dizziness. EXAM: CT HEAD WITHOUT CONTRAST TECHNIQUE: Contiguous axial images were obtained from the base of the skull through the vertex without intravenous contrast. COMPARISON:  None. FINDINGS: Brain: There is moderate diffuse atrophy with atrophy greatest in  the frontal and temporal lobes bilaterally. There is mild invagination of CSF into the sella. There is no intracranial mass, hemorrhage, extra-axial fluid collection, or midline shift. There is small vessel disease throughout the centra semiovale bilaterally. Small vessel disease is also noted in each internal and external  capsule region, as well as in the thalamus regions bilaterally. There is a prior small lacunar infarct in the right thalamus. No acute infarct evident. Vascular: There is no appreciable hyperdense vessel evident. There is calcification in each carotid siphon region as well as in the left distal vertebral artery. Skull: The bones are diffusely osteoporotic. There is a large apparent venous [place] in the posterior left occipital region with cortical thinning in this area. There are small lucent areas throughout the bony calvarium which do suggest the possibility of underlying multiple myeloma. Sinuses/Orbits: There is mucosal thickening in several ethmoid air cells. There is opacification in the posterior aspect of a hypoplastic left sphenoid sinus. Orbits appear symmetric bilaterally. Other: Mastoid air cells are clear. IMPRESSION: 1. Atrophy with supratentorial small vessel disease. No acute infarct evident. No intracranial mass or hemorrhage. 2. Bones diffusely osteoporotic. Prominent presumed venous [place] in the medial left occipital bone. Several small lucencies are noted throughout the calvarium. The possibility of underlying multiple myeloma must be of concern given this appearance. Correlation with serum electrophoresis in this regard advised. 3.  Foci of arterial vascular calcification noted. 4.  Areas of paranasal sinus disease noted. Electronically Signed   By: Lowella Grip III M.D.   On: 12/30/2017 21:40   Mr Brain W And Wo Contrast  Result Date: 12/31/2017 CLINICAL DATA:  Dizziness since yesterday, speech difficulties and shaking spells. Suspect stroke. History of hypertension, hyperlipidemia, vertigo. EXAM: MRI HEAD WITHOUT AND WITH CONTRAST TECHNIQUE: Multiplanar, multiecho pulse sequences of the brain and surrounding structures were obtained without and with intravenous contrast. CONTRAST:  40m MULTIHANCE GADOBENATE DIMEGLUMINE 529 MG/ML IV SOLN COMPARISON:  CT HEAD December 30, 2017 FINDINGS:  Mildly motion degraded examination. INTRACRANIAL CONTENTS: No reduced diffusion to suggest acute ischemia. Chronic microhemorrhage LEFT parietal lobe. The ventricles and sulci are normal for patient's age. No suspicious parenchymal signal, masses, mass effect. Confluent supratentorial and patchy pontine white matter FLAIR T2 hyperintensities. Prominent basal ganglia and to lesser extent thalami perivascular spaces associated with chronic small vessel ischemic disease. Old RIGHT basal ganglia lacunar infarct. No abnormal intraparenchymal or extra-axial enhancement. No abnormal extra-axial fluid collections. No extra-axial masses. Prominent cerebral spinal fluid space extending into the calvarium within posterior fossa, possible arachnoid cyst without enhancement. VASCULAR: Normal major intracranial vascular flow voids present at skull base. SKULL AND UPPER CERVICAL SPINE: No abnormal sellar expansion. No suspicious calvarial bone marrow signal; bright T1 signal compatible with osteopenia. No discrete enhancing calvarial lesion. Craniocervical junction maintained. SINUSES/ORBITS: Trace LEFT mastoid effusion. Paranasal sinus are well aerated.The included ocular globes and orbital contents are non-suspicious. Status post bilateral ocular lens implants. OTHER: None. IMPRESSION: 1. No acute intracranial process on this mildly motion degraded examination. 2. Moderate to severe chronic small vessel ischemic disease and old RIGHT basal ganglia lacunar infarct. Electronically Signed   By: CElon AlasM.D.   On: 12/31/2017 02:27     Labs:   Basic Metabolic Panel: Recent Labs  Lab 12/30/17 1922 12/31/17 0541 01/01/18 0652  NA 130* 132* 134*  K 3.6 3.7 3.9  CL 95* 98* 102  CO2 25 22 25   GLUCOSE 94 84 97  BUN 14 12 15   CREATININE 0.74 0.67  0.71  CALCIUM 9.4 8.8* 8.5*   GFR Estimated Creatinine Clearance: 46 mL/min (by C-G formula based on SCr of 0.71 mg/dL). Liver Function Tests: Recent Labs  Lab  12/30/17 1922 12/31/17 0541  AST 17 19  ALT 16 14  ALKPHOS 48 42  BILITOT 0.8 1.1  PROT 6.4* 6.1*  ALBUMIN 3.7 3.7   No results for input(s): LIPASE, AMYLASE in the last 168 hours. No results for input(s): AMMONIA in the last 168 hours. Coagulation profile No results for input(s): INR, PROTIME in the last 168 hours.  CBC: Recent Labs  Lab 12/30/17 1922 12/31/17 0803 01/01/18 0652  WBC 5.1 5.5 4.2  NEUTROABS 3.4  --   --   HGB 12.6 13.5 11.5*  HCT 37.7 40.9 35.3*  MCV 96.7 98.3 97.2  PLT 349 359 330   Cardiac Enzymes: Recent Labs  Lab 12/30/17 1922  TROPONINI <0.03   BNP: Invalid input(s): POCBNP CBG: No results for input(s): GLUCAP in the last 168 hours. D-Dimer No results for input(s): DDIMER in the last 72 hours. Hgb A1c No results for input(s): HGBA1C in the last 72 hours. Lipid Profile No results for input(s): CHOL, HDL, LDLCALC, TRIG, CHOLHDL, LDLDIRECT in the last 72 hours. Thyroid function studies Recent Labs    12/31/17 0541  TSH 5.171*   Anemia work up Recent Labs    01/01/18 [place] 325   Microbiology No results found for this or any previous visit (from the past 240 hour(s)).   Discharge Instructions:   Discharge Instructions    Diet - low sodium heart healthy   Complete by:  As directed    Discharge instructions   Complete by:  As directed    Close follow up with cardiology Stay hydrated Home health   Increase activity slowly   Complete by:  As directed      Allergies as of 01/01/2018      Reactions   Carvedilol Other (See Comments)   Bradycardia   Diltiazem Other (See Comments)   Bradycardia   Lopressor [metoprolol] Other (See Comments)   Bradycardia      Medication List    TAKE these medications   aspirin EC 81 MG tablet Take 81 mg by mouth daily.   ferrous sulfate 325 (65 FE) MG tablet Take 325 mg by mouth 2 (two) times daily with a meal.   fluticasone 50 MCG/ACT nasal spray Commonly known as:   FLONASE Place 2 sprays into both nostrils daily as needed for allergies or rhinitis.   furosemide 20 MG tablet Commonly known as:  LASIX TAKE 1 TABLET BY MOUTH EVERY OTHER DAY. What changed:    how much to take  how to take this  when to take this   levothyroxine 75 MCG tablet Commonly known as:  SYNTHROID, LEVOTHROID TAKE 1 TABLET BY MOUTH ONCE A DAY BEFORE BREAKFAST What changed:  See the new instructions.   losartan 25 MG tablet Commonly known as:  COZAAR TAKE 1 TABLET BY MOUTH DAILY. What changed:    how much to take  how to take this  when to take this   meclizine 25 MG tablet Commonly known as:  ANTIVERT Take 0.5-1 tablets (12.5-25 mg total) by mouth 3 (three) times daily as needed for dizziness.   meloxicam 7.5 MG tablet Commonly known as:  MOBIC Take 1 tablet (7.5 mg total) by mouth daily. What changed:    when to take this  reasons to take this   omeprazole 20  MG capsule Commonly known as:  PRILOSEC TAKE 1 CAPSULE  BY MOUTH DAILY. What changed:    how much to take  how to take this  when to take this   polyethylene glycol packet Commonly known as:  MIRALAX / GLYCOLAX Take 8.5 g by mouth See admin instructions. Mix 1/2 scoop (8.5 g) in 8 oz orange juice and drink daily   potassium chloride 10 MEQ tablet Commonly known as:  K-DUR,KLOR-CON TAKE 1 TABLET BY MOUTH TWICE A DAY What changed:    how much to take  how to take this  when to take this   REFRESH OPTIVE OP Place 1 drop into both eyes daily as needed (dry eyes).   TYLENOL 8 HOUR ARTHRITIS PAIN 650 MG CR tablet Generic drug:  acetaminophen Take 1,300 mg by mouth 2 (two) times daily.      Follow-up Information    Nafziger, Tommi Rumps, NP Follow up in 1 week(s).   Specialty:  Family Medicine Contact information: [address] [place] [address] [phone]        Leonie Man, MD Follow up.   Specialty:  Cardiology Why:  keep scheduled appointment Contact  information: [address] [place] [address] [phone]            Time coordinating discharge: 35 min  Signed:  Geradine Girt   [facility] 01/01/2018, 1:05 PM

## 2018-01-01 NOTE — Care Management Obs Status (Signed)
MEDICARE OBSERVATION STATUS NOTIFICATION   Patient Details  Name: Deborah [name] MRN: [id number] Date of Birth: 1930/11/20   Medicare Observation Status Notification Given:  Yes    Kermit BaloKelli F Deanta Mincey, RN 01/01/2018, 1:24 PM

## 2018-01-02 ENCOUNTER — Telehealth: Payer: Self-pay | Admitting: Family Medicine

## 2018-01-02 NOTE — Telephone Encounter (Signed)
Transition Care Management Follow-up Telephone Call  Alferd ApaRose Rester WUJ:[id number] DOB: Jan 14, 1931 DOA: 12/30/2017  PCP: Shirline FreesNafziger, [name], NP  Admit date: 12/30/2017 Discharge date: 01/01/2018  RoseNorelliis a86 y.o.female,with history of CAD status post DES toRCA,bradycardia, diastolic heart failure, hypertension, hypothyroidism, paroxysmal atrial fibrillation, not on anti-Coblation due to GI bleed, severe mitral regurgitation came to hospital with complaints of dizziness. Patient does have history of vertigo and has required meclizine as needed. CT head and MRI brain were negative for acute stroke. Patient continued to have dizziness so we will replace and observation for symptom management. Patient was given Ativan and is very somnolent at this time. History obtained from patient's husband at bedside. No history of chest pain or shortness of breath. Did have nausea but no vomiting. No diarrhea. No previous history of stroke or seizures. No fever or chills   How have you been since you were released from the hospital? "still dizzy, just took dose of Meclinzine"   Do you understand why you were in the hospital? yes   Do you understand the discharge instructions? yes   Where were you discharged to? Home   Items Reviewed:  Medications reviewed: yes  Allergies reviewed: yes  Dietary changes reviewed: yes  Referrals reviewed: yes   Functional Questionnaire:   Activities of Daily Living (ADLs):   She states they are independent in the following: bathing and hygiene, feeding, continence, grooming, toileting and dressing States they require assistance with the following: ambulation, only if feeling dizzy while walking   Any transportation issues/concerns?: no   Any patient concerns? no   Confirmed importance and date/time of follow-up visits scheduled yes  Provider Appointment booked with Shirline Freesory Nafziger on 01/10/2018 Thursday at 11:00 am  Confirmed with patient  if condition begins to worsen call PCP or go to the ER.  Patient was given the office number and encouraged to call back with question or concerns.  : yes

## 2018-01-05 DIAGNOSIS — I5032 Chronic diastolic (congestive) heart failure: Secondary | ICD-10-CM | POA: Diagnosis not present

## 2018-01-05 DIAGNOSIS — I495 Sick sinus syndrome: Secondary | ICD-10-CM | POA: Diagnosis not present

## 2018-01-05 DIAGNOSIS — Q211 Atrial septal defect: Secondary | ICD-10-CM | POA: Diagnosis not present

## 2018-01-05 DIAGNOSIS — I2510 Atherosclerotic heart disease of native coronary artery without angina pectoris: Secondary | ICD-10-CM | POA: Diagnosis not present

## 2018-01-05 DIAGNOSIS — R930 Abnormal findings on diagnostic imaging of skull and head, not elsewhere classified: Secondary | ICD-10-CM | POA: Diagnosis not present

## 2018-01-05 DIAGNOSIS — I110 Hypertensive heart disease with heart failure: Secondary | ICD-10-CM | POA: Diagnosis not present

## 2018-01-05 DIAGNOSIS — I480 Paroxysmal atrial fibrillation: Secondary | ICD-10-CM | POA: Diagnosis not present

## 2018-01-05 DIAGNOSIS — I340 Nonrheumatic mitral (valve) insufficiency: Secondary | ICD-10-CM | POA: Diagnosis not present

## 2018-01-05 DIAGNOSIS — H8149 Vertigo of central origin, unspecified ear: Secondary | ICD-10-CM | POA: Diagnosis not present

## 2018-01-05 DIAGNOSIS — I700 Atherosclerosis of aorta: Secondary | ICD-10-CM | POA: Diagnosis not present

## 2018-01-08 DIAGNOSIS — H8149 Vertigo of central origin, unspecified ear: Secondary | ICD-10-CM | POA: Diagnosis not present

## 2018-01-08 DIAGNOSIS — I2510 Atherosclerotic heart disease of native coronary artery without angina pectoris: Secondary | ICD-10-CM | POA: Diagnosis not present

## 2018-01-08 DIAGNOSIS — Q211 Atrial septal defect: Secondary | ICD-10-CM | POA: Diagnosis not present

## 2018-01-08 DIAGNOSIS — I480 Paroxysmal atrial fibrillation: Secondary | ICD-10-CM | POA: Diagnosis not present

## 2018-01-08 DIAGNOSIS — I340 Nonrheumatic mitral (valve) insufficiency: Secondary | ICD-10-CM | POA: Diagnosis not present

## 2018-01-08 DIAGNOSIS — I700 Atherosclerosis of aorta: Secondary | ICD-10-CM | POA: Diagnosis not present

## 2018-01-08 DIAGNOSIS — R930 Abnormal findings on diagnostic imaging of skull and head, not elsewhere classified: Secondary | ICD-10-CM | POA: Diagnosis not present

## 2018-01-08 DIAGNOSIS — I110 Hypertensive heart disease with heart failure: Secondary | ICD-10-CM | POA: Diagnosis not present

## 2018-01-08 DIAGNOSIS — I495 Sick sinus syndrome: Secondary | ICD-10-CM | POA: Diagnosis not present

## 2018-01-08 DIAGNOSIS — I5032 Chronic diastolic (congestive) heart failure: Secondary | ICD-10-CM | POA: Diagnosis not present

## 2018-01-10 ENCOUNTER — Ambulatory Visit: Payer: Medicare HMO | Admitting: Adult Health

## 2018-01-10 ENCOUNTER — Encounter: Payer: Self-pay | Admitting: Adult Health

## 2018-01-10 VITALS — BP 136/60 | Temp 97.7°F | Wt 121.0 lb

## 2018-01-10 DIAGNOSIS — E039 Hypothyroidism, unspecified: Secondary | ICD-10-CM | POA: Diagnosis not present

## 2018-01-10 DIAGNOSIS — R42 Dizziness and giddiness: Secondary | ICD-10-CM

## 2018-01-10 LAB — T3, FREE: T3, Free: 3 pg/mL (ref 2.3–4.2)

## 2018-01-10 LAB — TSH: TSH: 4.31 u[IU]/mL (ref 0.35–4.50)

## 2018-01-10 LAB — T4, FREE: FREE T4: 1.13 ng/dL (ref 0.60–1.60)

## 2018-01-10 MED ORDER — MECLIZINE HCL 25 MG PO TABS: 12.5000 mg | ORAL_TABLET | Freq: Three times a day (TID) | ORAL | 0 refills | Status: DC | PRN

## 2018-01-10 NOTE — Progress Notes (Signed)
Subjective:    Patient ID: Deborah [name], female    DOB: 1930-10-03, 84 y.o.   MRN: [id number]  HPI  84 year old female who  has a past medical history of Anxiety, Bradycardia, CAD S/P percutaneous coronary angioplasty (11/2014), Chronic diastolic heart failure due to valvular disease ([place]) (2015), Diverticulitis, Essential hypertension, H/O: upper GI bleed (12/03/2014), History of uterine cancer (2001), Hyperlipidemia with target LDL less than 70, Hypothyroidism, Paroxysmal atrial fibrillation ([place]) (2014), PFO (patent foramen ovale), Severe mitral regurgitation by prior echocardiogram (09/2014), Sick sinus syndrome [facility]), Thoracic aortic atherosclerosis ([place]) (09/2014), Urinary tract infection, Vasovagal syncope, and Vertigo, central, unspecified laterality.  She presents to the office today for TCM visit  She was admitted on 12/30/2017 She was discharged on 01/01/2018  She was admitted to the hospital with complaint of dizziness.  While in the ER CT the head was done which showed no acute intracranial abnormality but she did have lucencies noted throughout the calvarium which was though could be from multiple myeloma.  Subsequently MRI was obtained which showed no abnormality.  Her she continued to feel dizzy and was unable to ambulate without significant assistance.  She was admitted for further observation.  Discharge note dizziness was with unclear etiology.  They seem to believe it was related to runs of A. fib.  She is unable to have a beta-blocker or dig due to the side effects.  Advised to follow-up with cardiology as outpatient  Additionally, TSH was checked in the hospital which showed slightly elevated level of 5.1.  No T3 or T4 was drawn he was advised to follow-up on to recheck TSH as outpatient  Today in the office she and her husband report that since being discharged they have started to decrease the amount of Meclizine. PT has started coming to the house and doing home exercises to  help with her symptoms.  She reports good results with the home exercises and no dizziness episodes yesterday.  In the office today she is complaining of feeling as though the "the room is spinning" denies any palpitations or feelings of flutter.  No shortness of breath or chest pain. She has an appointment with Cardiology next week   Review of Systems  Constitutional: Negative.   HENT: Negative.   Eyes: Negative.   Respiratory: Negative.   Cardiovascular: Negative.   Gastrointestinal: Negative.   Endocrine: Negative.   Genitourinary: Negative.   Musculoskeletal: Negative.   Skin: Negative.   Allergic/Immunologic: Negative.   Neurological: Positive for dizziness. Negative for tremors, syncope, speech difficulty, light-headedness, numbness and headaches.  Hematological: Negative.   Psychiatric/Behavioral: Negative.    Past Medical History:  Diagnosis Date  . Anxiety   . Bradycardia   . CAD S/P percutaneous coronary angioplasty 11/2014   DES PCI to RCA. Cath was done for pre-operative evaluation for mitral repair; LM 30%, LAD 50%, RCA 90% (Promus DES)  . Chronic diastolic heart failure due to valvular disease ([place]) 2015   Related to severe mitral regurgitation. Normal EF by echo.  . Diverticulitis   . Essential hypertension   . H/O: upper GI bleed 12/03/2014   While on ASA/Plavix & Warfarin --> Hct dropped to 18%; small AVM noted but no overt pathology.  [name] History of uterine cancer 2001   Status post hysterectomy  . Hyperlipidemia with target LDL less than 70    Coronary disease and thoracic aortic atheroma  . Hypothyroidism    On Levothyroxine  . Paroxysmal atrial fibrillation ([place]) 2014  Associated with sick sinus syndrome. -- Intolerant of beta blockers and diltiazem. Rate control with digoxin. Not on anticoagulation despite CHA2DS2Vasc 6  2/2 prior severe GI bleed on triple therapy  . PFO (patent foramen ovale)    Noted on TEE - L-R shunt (elsewhere reported it as a ASD)  .  Severe mitral regurgitation by prior echocardiogram 09/2014   Seen on TEE to have severe MR with multiple jets. Vena contracted 0.8; Consideration had been ? Mitral E-Clip - put on hold after GI Bleed.  . Sick sinus syndrome ([place])    Status post Loop Recorder: Medtronic Linq -- most recent interrogation 05/19/2016: 2 new pauses. One greater than 3 seconds at 0 320 the morning. Second was 4.4 seconds at 9:56 PM; also noted to have an episode of A. fib. --> Recommended further follow-up upon arrival to [facility].  . Thoracic aortic atherosclerosis ([place]) 09/2014   Moderate grade 3-4 atheroma noted on TEE  . Urinary tract infection   . Vasovagal syncope    Exacerbated by bradycardia, sick sinus syndrome, and mitral regurgitation.  . Vertigo, central, unspecified laterality    Chronic dizziness.    Social History   Socioeconomic History  . Marital status: Married    Spouse name: Not on file  . Number of children: 2  . Years of education: Not on file  . Highest education level: Not on file  Occupational History    Comment: Retired Clinical research associate  . Financial resource strain: Not on file  . Food insecurity:    Worry: Not on file    Inability: Not on file  . Transportation needs:    Medical: Not on file    Non-medical: Not on file  Tobacco Use  . Smoking status: Former Smoker    Packs/day: 0.50    Years: 5.00    Pack years: 2.50    Types: Cigarettes    Last attempt to quit: 08/02/1970    Years since quitting: 47.4  . Smokeless tobacco: Never Used  Substance and Sexual Activity  . Alcohol use: Yes    Comment: "wine occasionally"  . Drug use: No  . Sexual activity: Not on file  Lifestyle  . Physical activity:    Days per week: Not on file    Minutes per session: Not on file  . Stress: Not on file  Relationships  . Social connections:    Talks on phone: Not on file    Gets together: Not on file    Attends religious service: Not on file    Active member of  club or organization: Not on file    Attends meetings of clubs or organizations: Not on file    Relationship status: Not on file  . Intimate partner violence:    Fear of current or ex partner: Not on file    Emotionally abused: Not on file    Physically abused: Not on file    Forced sexual activity: Not on file  Other Topics Concern  . Not on file  Social History Narrative   Married for 47 years    Has a son, daughter died of breast cancer   One grandson    71 from [place] -( Lives there from May- October) to be closer to their son & only remaining family.    Past Surgical History:  Procedure Laterality Date  . ABDOMINAL HYSTERECTOMY  2001  . Cataract Surgery  Bilateral   . COLONOSCOPY W/ BIOPSIES  2006  2 polyps noted along with diverticulitis  . CORONARY ANGIOPLASTY WITH STENT PLACEMENT  11/2014   [place]: 90% RCA - PCI with Promus DES.  [name] implanted cardiac monitor     . LOOP RECORDER INSERTION  07/24/2013   Medtronic Linq - to evaluate SSS for symptomatic bradycardia  . Right and Left Heart Catheterization  11/2014   RHC: RAP 4, RVP 32/4, PA peak 29/7/16. LVEDP 18.  [facility]: 30% LM, 50% LAD, 90% RCA --> PCI (Promus DES)  . TEE WITHOUT CARDIOVERSION  09/2014   Normal LV function. Severe MR with multiple jets. Vena contractile 0.8.  PFO with L-R shunt; moderate grade 3-4 aortic atheroma  . TONSILLECTOMY    . TRANSTHORACIC ECHOCARDIOGRAM  07/2013   Severe left atrial enlargement (volume 57 mL). Normal EF. Moderate TR. ASD. Moderate MR. No pulmonary hypertension.  . TRANSTHORACIC ECHOCARDIOGRAM  08/2014   Normal LV function with biatrial enlargement. Moderate-severe mitral and tricuspid insufficiency.    Family History  Problem Relation Age of Onset  . Stomach cancer Father   . Cancer Brother   . Diabetes Brother   . Cancer Daughter     Allergies  Allergen Reactions  . Carvedilol Other (See Comments)    Bradycardia   .  Diltiazem Other (See Comments)    Bradycardia  . Lopressor [Metoprolol] Other (See Comments)    Bradycardia    Current Outpatient Medications on File Prior to Visit  Medication Sig Dispense Refill  . acetaminophen (TYLENOL 8 HOUR ARTHRITIS PAIN) 650 MG CR tablet Take 1,300 mg by mouth 2 (two) times daily.     [name] aspirin EC 81 MG tablet Take 81 mg by mouth daily.    . Carboxymethylcellul-Glycerin (REFRESH OPTIVE OP) Place 1 drop into both eyes daily as needed (dry eyes).    . ferrous sulfate 325 (65 FE) MG tablet Take 325 mg by mouth 2 (two) times daily with a meal.     . fluticasone (FLONASE) 50 MCG/ACT nasal spray Place 2 sprays into both nostrils daily as needed for allergies or rhinitis.     . furosemide (LASIX) 20 MG tablet TAKE 1 TABLET BY MOUTH EVERY OTHER DAY. (Patient taking differently: TAKE 1 TABLET (20 MG)  BY MOUTH EVERY OTHER DAY.) 45 tablet 2  . levothyroxine (SYNTHROID, LEVOTHROID) 75 MCG tablet TAKE 1 TABLET BY MOUTH ONCE A DAY BEFORE BREAKFAST (Patient taking differently: TAKE 1 TABLET (75 MCG) BY MOUTH ONCE A DAY BEFORE BREAKFAST) 90 tablet 2  . losartan (COZAAR) 25 MG tablet TAKE 1 TABLET BY MOUTH DAILY. (Patient taking differently: TAKE 1 TABLET (25 MG) BY MOUTH DAILY.) 90 tablet 2  . meclizine (ANTIVERT) 25 MG tablet Take 0.5-1 tablets (12.5-25 mg total) by mouth 3 (three) times daily as needed for dizziness. 30 tablet 0  . meloxicam (MOBIC) 7.5 MG tablet Take 1 tablet (7.5 mg total) by mouth daily. (Patient taking differently: Take 7.5 mg by mouth daily as needed for pain. ) 90 tablet 1  . omeprazole (PRILOSEC) 20 MG capsule TAKE 1 CAPSULE  BY MOUTH DAILY. (Patient taking differently: TAKE 1 CAPSULE (20 MG) BY MOUTH DAILY.) 90 capsule 2  . polyethylene glycol (MIRALAX / GLYCOLAX) packet Take 8.5 g by mouth See admin instructions. Mix 1/2 scoop (8.5 g) in 8 oz orange juice and drink daily    . potassium chloride (K-DUR,KLOR-CON) 10 MEQ tablet TAKE 1 TABLET BY MOUTH TWICE A DAY  (Patient taking differently: TAKE 1 TABLET (10 MEQ)  BY MOUTH TWICE A DAY) 180 tablet 2   No current facility-administered medications on file prior to visit.     LMP  (LMP Unknown)       Objective:   Physical Exam  Constitutional: She is oriented to person, place, and time. She appears well-developed and well-nourished. No distress.  HENT:  Head: Normocephalic and atraumatic.  Right Ear: External ear normal.  Left Ear: External ear normal.  Nose: Nose normal.  Mouth/Throat: Oropharynx is clear and moist. No oropharyngeal exudate.  Eyes: Pupils are equal, round, and reactive to light. Conjunctivae are normal. Right eye exhibits no discharge. Left eye exhibits no discharge. No scleral icterus. Right eye exhibits nystagmus. Left eye exhibits nystagmus.  Neck: Normal range of motion. Neck supple. No JVD present. No tracheal deviation present. No thyromegaly present.  Cardiovascular: Normal rate, regular rhythm, normal heart sounds and intact distal pulses. Exam reveals no gallop and no friction rub.  No murmur heard. Pulmonary/Chest: Effort normal and breath sounds normal. No stridor. No respiratory distress. She has no wheezes. She has no rales. She exhibits no tenderness.  Abdominal: Soft. Bowel sounds are normal. She exhibits no distension and no mass. There is no tenderness. There is no rebound and no guarding.  Musculoskeletal: Normal range of motion. She exhibits no edema, tenderness or deformity.  Lymphadenopathy:    She has no cervical adenopathy.  Neurological: She is alert and oriented to person, place, and time. She has normal reflexes. She displays normal reflexes. No cranial nerve deficit. She exhibits normal muscle tone. Coordination normal.  Skin: Skin is warm and dry. No rash noted. She is not diaphoretic. No erythema. No pallor.  Psychiatric: She has a normal mood and affect. Her behavior is normal. Judgment and thought content normal.  Nursing note and vitals reviewed.      Assessment & Plan:  1. Dizziness -During episode of vertigo in the office today heart rate was normal sinus rhythm.  Advised her to continue with meclizine as needed and physical therapy which is coming out this afternoon.  Follow-up with cardiology and will await the recommendations - meclizine (ANTIVERT) 25 MG tablet; Take 0.5-1 tablets (12.5-25 mg total) by mouth 3 (three) times daily as needed for dizziness.  Dispense: 30 tablet; Refill: 0  2. Hypothyroidism, unspecified type -Consider increase in Synthroid - TSH - T3, Free - T4, Free  Dorothyann Peng, NP

## 2018-01-11 DIAGNOSIS — H8149 Vertigo of central origin, unspecified ear: Secondary | ICD-10-CM | POA: Diagnosis not present

## 2018-01-11 DIAGNOSIS — I110 Hypertensive heart disease with heart failure: Secondary | ICD-10-CM | POA: Diagnosis not present

## 2018-01-11 DIAGNOSIS — I480 Paroxysmal atrial fibrillation: Secondary | ICD-10-CM | POA: Diagnosis not present

## 2018-01-11 DIAGNOSIS — I5032 Chronic diastolic (congestive) heart failure: Secondary | ICD-10-CM | POA: Diagnosis not present

## 2018-01-11 DIAGNOSIS — I2510 Atherosclerotic heart disease of native coronary artery without angina pectoris: Secondary | ICD-10-CM | POA: Diagnosis not present

## 2018-01-11 DIAGNOSIS — I340 Nonrheumatic mitral (valve) insufficiency: Secondary | ICD-10-CM | POA: Diagnosis not present

## 2018-01-11 DIAGNOSIS — Q211 Atrial septal defect: Secondary | ICD-10-CM | POA: Diagnosis not present

## 2018-01-11 DIAGNOSIS — I495 Sick sinus syndrome: Secondary | ICD-10-CM | POA: Diagnosis not present

## 2018-01-11 DIAGNOSIS — I700 Atherosclerosis of aorta: Secondary | ICD-10-CM | POA: Diagnosis not present

## 2018-01-11 DIAGNOSIS — R930 Abnormal findings on diagnostic imaging of skull and head, not elsewhere classified: Secondary | ICD-10-CM | POA: Diagnosis not present

## 2018-01-11 NOTE — Addendum Note (Signed)
Addended by: Nancy FetterNAFZIGER, Carrianne Hyun L on: 01/11/2018 12:27 PM   Modules accepted: Level of Service

## 2018-01-16 ENCOUNTER — Ambulatory Visit: Payer: Medicare HMO | Admitting: Cardiology

## 2018-01-16 ENCOUNTER — Encounter: Payer: Self-pay | Admitting: Cardiology

## 2018-01-16 VITALS — BP 138/80 | HR 70 | Ht 65.0 in | Wt 119.8 lb

## 2018-01-16 DIAGNOSIS — I2510 Atherosclerotic heart disease of native coronary artery without angina pectoris: Secondary | ICD-10-CM | POA: Diagnosis not present

## 2018-01-16 DIAGNOSIS — I080 Rheumatic disorders of both mitral and aortic valves: Secondary | ICD-10-CM

## 2018-01-16 DIAGNOSIS — I480 Paroxysmal atrial fibrillation: Secondary | ICD-10-CM | POA: Diagnosis not present

## 2018-01-16 DIAGNOSIS — R42 Dizziness and giddiness: Secondary | ICD-10-CM | POA: Diagnosis not present

## 2018-01-16 NOTE — Patient Instructions (Signed)
MEDICATION INSTRUCTIONS  NO CHANGE WITH CURRENT MEDICINE  RECOMMEND YOU USE CLARITIN ( LORATADINE) 10 MG DAILY FOR VERITGO MAY TRY AFRIN A COUPLE TIMES A WEEK  AND USE SALINE NASAL SPRAY AFTERWARDS.    Your physician wants you to follow-up in OCT 2019 WITH DR HARDING.  You will receive a reminder letter in the mail two months in advance. If you don't receive a letter, please call our office to schedule the follow-up appointment.    If you need a refill on your cardiac medications before your next appointment, please call your pharmacy.

## 2018-01-16 NOTE — Progress Notes (Signed)
PCP: Shirline Frees, NP  Clinic Note: Chief Complaint  Patient presents with  . Follow-up    pt complains of dizziness (vertigo), pt denies SOB, chest pains, swelling in hands/feet    HPI: Deborah [name] is a 84 y.o. female who is being seen today for six-month follow-up evaluation of atrial fibrillation, bradycardia and CAD with severe MR and diastolic CHF.  --She has declined pacemaker and DOAC on multiple occasions. Symptoms of lightheadedness and dizziness improved with discontinuation of digoxin.  I last saw Deborah [name] in May -she was doing relatively well.  She indicated about a month prior to that she had a short episode of fast heartbeats but nothing significant.  No fatigue or lightheadedness.  She just had some spacey headed feeling.  Otherwise no complaints. December 5 by Gypsy Balsam, NP in the EP clinic -> had done relatively well.  Echocardiogram with worsening valve lesions was noted.  Again indicated lack of desire to do further evaluation.  She denied any palpitations or chest pain dyspnea etc.  Tolerating medications without issues.  No significant sinus pauses.  Agreed with being off digoxin.  It appears that her loop recorder is no longer functional (likely the battery is dead.  She declined DOAC.  Suheily Birks was last seen on September 04, 2017--she mostly was worried about issues and worse sleepiness.  But no cardiac issues.  Recent Hospitalizations:   Hospitalization for 14th.  For vertigo.  Thought to be related potentially to dehydration and uncontrolled A. fib.  Studies Personally Reviewed - if available, images/films reviewed: From Epic Chart or Care Everywhere:  None  Interval History: Deborah [name] presents today for cardiology evaluation of recurrent vertigo episodes.  There is suggestion that this could be related to A. fib.  She definitely has vertigo and was having vertigo while I was talking to her today.  She did not feel any sensation of irregular heartbeats or  palpitations and was not in A. fib during this visit.  This would probably exclude A. fib as the cause for her recurrent vertigo.  She is not really noticing any palpitations or rapid irregular heartbeats to begin with.  She really has not had any syncope type symptoms it is all vertigo. She is not noting any discomfort in her chest.  She still has a pretty poor balance and has had some near falls with unsteady gait.  Now using a walker. No real PND orthopnea.  No notable edema.   Does have some exertional dyspnea but has significant kyphoscoliosis. No TIA or amaurosis fugax.   ROS: A comprehensive was performed. Pertinent positives and negatives noted above. Review of Systems  Constitutional: Negative for malaise/fatigue.  HENT: Positive for congestion. Negative for nosebleeds.   Respiratory: Positive for cough. Negative for sputum production and shortness of breath.   Cardiovascular: Positive for leg swelling (Minimal). Negative for palpitations.  Gastrointestinal: Negative for blood in stool and melena.  Genitourinary: Negative for hematuria.  Musculoskeletal: Positive for back pain and joint pain.  Neurological: Positive for dizziness and weakness (Global).       Very poor balance.  Bilateral extremity and core weakness  Psychiatric/Behavioral: Positive for depression. The patient is nervous/anxious.   All other systems reviewed and are negative.  I have reviewed and (if needed) personally updated the patient's problem list, medications, allergies, past medical and surgical history, social and family history.   Past Medical History:  Diagnosis Date  . Anxiety   . Bradycardia   . CAD S/P percutaneous  coronary angioplasty 11/2014   DES PCI to RCA. Cath was done for pre-operative evaluation for mitral repair; LM 30%, LAD 50%, RCA 90% (Promus DES)  . 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 Normal EF by echo.  .  Diverticulitis   . Essential hypertension   . H/O: upper GI bleed 12/03/2014   While on ASA/Plavix & Warfarin --> Hct dropped to 18%; small AVM noted but no overt pathology.  [name] History of uterine cancer 2001   Status post hysterectomy  . Hyperlipidemia with target LDL less than 70    Coronary disease and thoracic aortic atheroma  . Hypothyroidism    On Levothyroxine  . Paroxysmal atrial fibrillation (HCC) 2014   Associated with sick sinus syndrome. -- Intolerant of beta blockers and diltiazem. Rate control with digoxin. Not on anticoagulation despite CHA2DS2Vasc 6  2/2 prior severe GI bleed on triple therapy  . PFO (patent foramen ovale)    Noted on TEE - L-R shunt (elsewhere reported it as a ASD)  . Severe mitral regurgitation by prior echocardiogram 09/2014   Seen on TEE to have severe MR with multiple jets. Vena contracted 0.8; Consideration had been ? Mitral E-Clip - put on hold after GI Bleed.  . Sick sinus syndrome (HCC)    Status post Loop Recorder: [employer] Linq -- most recent interrogation 05/19/2016: 2 new pauses. One greater than 3 seconds at 0 320 the morning. Second was 4.4 seconds at 9:56 PM; also noted to have an episode of A. fib. --> Recommended further follow-up upon arrival to [facility].  . Thoracic aortic atherosclerosis (HCC) 09/2014   Moderate grade 3-4 atheroma noted on TEE  . Urinary tract infection   . Vasovagal syncope    Exacerbated by bradycardia, sick sinus syndrome, and mitral regurgitation.  . Vertigo, central, unspecified laterality    Chronic dizziness.    Past Surgical History:  Procedure Laterality Date  . ABDOMINAL HYSTERECTOMY  2001  . Cataract Surgery  Bilateral   . COLONOSCOPY W/ BIOPSIES  2006   2 polyps noted along with diverticulitis  . CORONARY ANGIOPLASTY WITH STENT PLACEMENT  11/2014   [facility]: 90% RCA - PCI with Promus DES.  [name] implanted cardiac monitor     . LOOP RECORDER INSERTION   07/24/2013   Medtronic Linq - to evaluate SSS for symptomatic bradycardia  . Right and Left Heart Catheterization  11/2014   RHC: RAP 4, RVP 32/4, PA peak 29/7/16. LVEDP 18.  CORS: 30% LM, 50% LAD, 90% RCA --> PCI (Promus DES)  . TEE WITHOUT CARDIOVERSION  09/2014   Normal LV function. Severe MR with multiple jets. Vena contractile 0.8.  PFO with L-R shunt; moderate grade 3-4 aortic atheroma  . TONSILLECTOMY    . TRANSTHORACIC ECHOCARDIOGRAM  07/2017   EF 60 to 65%.  Moderate-severe MR.  Moderate TR with elevated RVSP  . TRANSTHORACIC ECHOCARDIOGRAM  08/2014   Normal LV function with biatrial enlargement. Moderate-severe mitral and tricuspid insufficiency.     Current Meds  Medication Sig  . acetaminophen (TYLENOL 8 HOUR ARTHRITIS PAIN) 650 MG CR tablet Take 1,300 mg by mouth 2 (two) times daily.   [name] aspirin EC 81 MG tablet Take 81 mg by mouth daily.  . Carboxymethylcellul-Glycerin (REFRESH OPTIVE OP) Place 1 drop into both eyes daily as needed (dry eyes).  . ferrous sulfate 325 (65 FE) MG tablet Take 325 mg by mouth  2 (two) times daily with a meal.   . fluticasone (FLONASE) 50 MCG/ACT nasal spray Place 2 sprays into both nostrils daily as needed for allergies or rhinitis.   . furosemide (LASIX) 20 MG tablet TAKE 1 TABLET BY MOUTH EVERY OTHER DAY. (Patient taking differently: TAKE 1 TABLET (20 MG)  BY MOUTH EVERY OTHER DAY.)  . levothyroxine (SYNTHROID, LEVOTHROID) 75 MCG tablet TAKE 1 TABLET BY MOUTH ONCE A DAY BEFORE BREAKFAST (Patient taking differently: TAKE 1 TABLET (75 MCG) BY MOUTH ONCE A DAY BEFORE BREAKFAST)  . losartan (COZAAR) 25 MG tablet TAKE 1 TABLET BY MOUTH DAILY. (Patient taking differently: TAKE 1 TABLET (25 MG) BY MOUTH DAILY.)  . meclizine (ANTIVERT) 25 MG tablet Take 0.5-1 tablets (12.5-25 mg total) by mouth 3 (three) times daily as needed for dizziness.  . meloxicam (MOBIC) 7.5 MG tablet Take 1 tablet (7.5 mg total) by mouth daily. (Patient taking differently: Take 7.5  mg by mouth daily as needed for pain. )  . omeprazole (PRILOSEC) 20 MG capsule TAKE 1 CAPSULE  BY MOUTH DAILY. (Patient taking differently: TAKE 1 CAPSULE (20 MG) BY MOUTH DAILY.)  . polyethylene glycol (MIRALAX / GLYCOLAX) packet Take 8.5 g by mouth See admin instructions. Mix 1/2 scoop (8.5 g) in 8 oz orange juice and drink daily  . potassium chloride (K-DUR,KLOR-CON) 10 MEQ tablet TAKE 1 TABLET BY MOUTH TWICE A DAY (Patient taking differently: TAKE 1 TABLET (10 MEQ) BY MOUTH TWICE A DAY)    Allergies  Allergen Reactions  . Carvedilol Other (See Comments)    Bradycardia   . Diltiazem Other (See Comments)    Bradycardia  . Lopressor [Metoprolol] Other (See Comments)    Bradycardia    Social History   Tobacco Use  . Smoking status: Former Smoker    Packs/day: 0.50    Years: 5.00    Pack years: 2.50    Types: Cigarettes    Last attempt to quit: 08/02/1970    Years since quitting: 47.5  . Smokeless tobacco: Never Used  Substance Use Topics  . Alcohol use: Yes    Comment: "wine occasionally"  . Drug use: No   Social History   Social History Narrative   Married for 59 years    Has a son, daughter died of breast cancer   One grandson    Moved from [place] -( Lives there from May- October) to be closer to their son & only remaining family.     family history includes Cancer in her brother and daughter; Diabetes in her brother; Stomach cancer in her father.  Wt Readings from Last 3 Encounters:  01/16/18 119 lb 12.8 oz (54.3 kg)  01/10/18 121 lb (54.9 kg)  12/31/17 122 lb 12.7 oz (55.7 kg)    PHYSICAL EXAM BP 138/80 (BP Location: Right Arm)   Pulse 70   Ht  (1.651 m)   Wt 119 lb 12.8 oz (54.3 kg)   LMP  (LMP Unknown)   BMI 19.94 kg/m   Physical Exam  Constitutional: No distress.  Very thin, frail woman.  She appears older than her stated age now.  She has significant kyphoscoliosis and is a mouth breather. Says she is having vertigo episodes during exam    HENT:  Head: Atraumatic.  Eyes: EOM are normal.  Neck: No hepatojugular reflux (Versus palpable carotid) and no JVD present. Carotid bruit is present (Difficult to tell if his carotid bruit or simply radiated murmur.  Probably more murmur.).  Cardiovascular:  Normal rate, regular rhythm, S1 normal and S2 normal.  Occasional extrasystoles are present. PMI is not displaced. Exam reveals decreased pulses (Difficult to palpate pedal pulses, but present). Exam reveals no gallop.  Murmur heard.  Harsh crescendo-decrescendo midsystolic murmur is present with a grade of 1/6 at the upper right sternal border radiating to the neck. High-pitched blowing holosystolic murmur of grade 2/6 is also present at the apex radiating to the back. Pulmonary/Chest: Effort normal. No respiratory distress. She has no wheezes. She has no rales.  Mouth breathing.  Mild interstitial sounds, but no rales or  Abdominal: Soft. Bowel sounds are normal. She exhibits no distension. There is no tenderness. There is no rebound.  Neurological: Coordination (Very.  But she did not have positive pronator drift.  Was able to touch finger to nose, albeit very slow.  With eyes closed, however she was swaying to and fro and had to hold on before falling.  became quite fearful despite having her husband behind her) abnormal.  She is awake and alert as well as oriented.  But seems to be dazed  Psychiatric:  She seems very different than her normal self.  Very anxious with his staring glare.  Almost as though she is frightened.  Vitals reviewed.   Adult ECG Report -NA  Other studies Reviewed: Additional studies/ records that were reviewed today include:  Recent Labs:    Lab Results  Component Value Date   CHOL 169 07/19/2017   HDL 54.80 07/19/2017   LDLCALC 83 07/19/2017   TRIG 152.0 (H) 07/19/2017   CHOLHDL 3 07/19/2017    ASSESSMENT / PLAN:  Problem List Items Addressed This Visit    Vertigo   Relevant Orders   EKG 12-Lead  (Completed)   Paroxysmal atrial fibrillation (HCC): CHA2DS2Vasc = 6; Not currently on AC. - Primary (Chronic)   Relevant Orders   EKG 12-Lead (Completed)   Mitral regurgitation and aortic stenosis (Chronic)   Coronary artery disease involving native heart without angina pectoris (Chronic)   Relevant Orders   EKG 12-Lead (Completed)     She certainly has vertigo symptoms, but based on the fact that she was having a clear-cut episode today and was not in any way shape or form in A. fib, I do not think this is the cause.  I do not think that her MR has a good either.  She has no real heart failure dyspnea symptoms began on what his normal baseline for her.  We are trying to avoid aggressively treating her blood pressure, and without any irregular heartbeat palpitations, I am reluctant to place her on beta-blocker.  We have never shown improved with any arrhythmias.  We therefore avoiding beta-blockers (with history of bradycardia) and minimizing any diuretic.  She is using it rarely now --> preferred use of support stockings. . --> On minimal dose of losartan which I think we would potentially be able to discontinue if it were not for the fact that she has MR.   Need to avoid dehydration to  prevent orthostatic hypotension.   Would prefer permissive hypertension.  Especially in light of no active heart failure symptoms. No longer on statin -- Given her age and comorbidities.  Recommend continued use of walker and/or cane.  Based on previous discussion, would avoid TEE or invasive evaluation of her mitral regurgitation.  No plans for further evaluation. No longer on Plavix but on low-dose aspirin given her CAD history.  Has a history of A. fib, but no clear-cut  evidence of recurrence.  Has not had rapid heartbeats But not on full anticoagulation for A. fib for obvious reasons fall risk).  ? If some of the vertigo is related to congestion & cough from ? Allergies -- recommend  Loratidine  Current medicines are reviewed at length with the patient today. (+/- concerns) None The following changes have been made: None  Patient Instructions  MEDICATION INSTRUCTIONS  NO CHANGE WITH CURRENT MEDICINE  RECOMMEND YOU USE CLARITIN ( LORATADINE) 10 MG DAILY FOR VERITGO MAY TRY AFRIN A COUPLE TIMES A WEEK  AND USE SALINE NASAL SPRAY AFTERWARDS.    Your physician wants you to follow-up in OCT 2019 WITH DR HARDING.  You will receive a reminder letter in the mail two months in advance. If you don't receive a letter, please call our office to schedule the follow-up appointment.    If you need a refill on your cardiac medications before your next appointment, please call your pharmacy.   Studies Ordered:   Orders Placed This Encounter  Procedures  . EKG 12-Lead      Bryan Lemma, M.D., M.S. Interventional Cardiologist   Pager # [phone] Phone # [phone] [address] [place], [place] [address]

## 2018-01-25 ENCOUNTER — Encounter: Payer: Self-pay | Admitting: Cardiology

## 2018-01-30 ENCOUNTER — Encounter: Payer: Self-pay | Admitting: Cardiology

## 2018-07-04 ENCOUNTER — Ambulatory Visit (INDEPENDENT_AMBULATORY_CARE_PROVIDER_SITE_OTHER): Payer: Medicare HMO | Admitting: *Deleted

## 2018-07-04 DIAGNOSIS — Z23 Encounter for immunization: Secondary | ICD-10-CM | POA: Diagnosis not present

## 2018-07-10 ENCOUNTER — Encounter: Payer: Self-pay | Admitting: Cardiology

## 2018-07-10 ENCOUNTER — Ambulatory Visit: Payer: Medicare HMO | Admitting: Cardiology

## 2018-07-10 VITALS — BP 136/94 | HR 82 | Ht 65.0 in | Wt 125.2 lb

## 2018-07-10 DIAGNOSIS — I10 Essential (primary) hypertension: Secondary | ICD-10-CM

## 2018-07-10 DIAGNOSIS — I2510 Atherosclerotic heart disease of native coronary artery without angina pectoris: Secondary | ICD-10-CM | POA: Diagnosis not present

## 2018-07-10 DIAGNOSIS — Z9861 Coronary angioplasty status: Secondary | ICD-10-CM | POA: Diagnosis not present

## 2018-07-10 DIAGNOSIS — I080 Rheumatic disorders of both mitral and aortic valves: Secondary | ICD-10-CM

## 2018-07-10 DIAGNOSIS — I480 Paroxysmal atrial fibrillation: Secondary | ICD-10-CM | POA: Diagnosis not present

## 2018-07-10 DIAGNOSIS — I5032 Chronic diastolic (congestive) heart failure: Secondary | ICD-10-CM | POA: Diagnosis not present

## 2018-07-10 DIAGNOSIS — I495 Sick sinus syndrome: Secondary | ICD-10-CM

## 2018-07-10 DIAGNOSIS — I38 Endocarditis, valve unspecified: Secondary | ICD-10-CM

## 2018-07-10 DIAGNOSIS — R55 Syncope and collapse: Secondary | ICD-10-CM | POA: Diagnosis not present

## 2018-07-10 NOTE — Progress Notes (Signed)
PCP: Shirline Frees, NP  Clinic Note: Chief Complaint  Patient presents with  . Follow-up    Feels much better.  Much less dizziness and better energy level.    HPI: Deborah [name] is a 84 y.o. female with PMH notable for PAF, bradycardia, CAD & Severe MR with HFpEF who is being seen today for 6 month --She has declined pacemaker and DOAC on multiple occasions.  Symptoms of lightheadedness and dizziness improved with discontinuation of digoxin. After last visit - ARB also stopped.  She is repeatedly declined taking DOAC. She is not interested in further evaluation of her mitral valve regurgitation.  Would not be interested in invasive procedures.  I last saw Deborah [name] in May 2019.  She was having some recurrent vertigo episodes and there was concern for possible recurrent A. fib.  She did not feel any abnormal sensations to suggest A. fib.  She was not in A. fib during her visit.  Noted poor balance issues.  No syncope or near syncope.  Exertional dyspnea noted with vigorous walking. --We discontinued some medications in attempt to avoid orthostatic hypotension.  We stopped her losartan, backed off on Lasix.  Recent Hospitalizations:   None  Studies Personally Reviewed - if available, images/films reviewed: From Epic Chart or Care Everywhere:  None since November 2018  Interval History: Deborah [name] presents today for routine follow-up feeling much better.  Her husband indicates that this last 6 months of probably be in the best 6 months she has had in the last 3 to 4 years.  She is had much less dizziness feeling much better with no significant palpitations fatigue totally totally improved.  She is doing daily exercises and tolerating it well.  She does use a walker when she is walking longer distances, but prefers to use shopping cart does not like that to walk around the grocery store.  She has not had any significant palpitations or rapid irregular heartbeats.  Dizziness is notably improved.  Her  swelling is notably improved with the compression stockings and not using any additional Lasix.  Vertigo very stable.  No suggestion of recurrent A. fib.  No rapid irregular heartbeats palpitations.  No syncope/near syncope or TIA/amaurosis fugax. No PND, orthopnea or edema.  Some exertional dyspnea related to kyphoscoliosis but stable.  ROS: A comprehensive was performed. Pertinent positives and negatives noted above. Review of Systems  Constitutional: Negative for malaise/fatigue.  HENT: Negative for congestion and nosebleeds.   Respiratory: Negative for cough, sputum production and shortness of breath.   Cardiovascular: Negative for palpitations and leg swelling (Trivial).  Gastrointestinal: Negative for blood in stool and melena.  Genitourinary: Negative for hematuria.  Musculoskeletal: Positive for back pain and joint pain.  Neurological: Positive for dizziness (Much improved) and weakness (Global).       Still has poor balance.  Bilateral extremity and core weakness -also change of center balance with kyphoscoliosis.  Psychiatric/Behavioral: Negative for depression. The patient is nervous/anxious.   All other systems reviewed and are negative.  I have reviewed and (if needed) personally updated the patient's problem list, medications, allergies, past medical and surgical history, social and family history.   Past Medical History:  Diagnosis Date  . Anxiety   . Bradycardia   . CAD S/P percutaneous coronary angioplasty 11/2014   DES PCI to RCA. Cath was done for pre-operative evaluation for mitral repair; LM 30%, LAD 50%, RCA 90% (Promus DES)  . Chronic diastolic heart failure due to valvular disease (HCC) 2015  Related to severe mitral regurgitation. Normal EF by echo.  . Diverticulitis   . Essential hypertension   . H/O: upper GI bleed 12/03/2014   While on ASA/Plavix & Warfarin --> Hct dropped to 18%; small AVM noted but no overt pathology.  [name] History of uterine cancer 2001     Status post hysterectomy  . Hyperlipidemia with target LDL less than 70    Coronary disease and thoracic aortic atheroma  . Hypothyroidism    On Levothyroxine  . Paroxysmal atrial fibrillation (HCC) 2014   Associated with sick sinus syndrome. -- Intolerant of beta blockers and diltiazem. Rate control with digoxin. Not on anticoagulation despite CHA2DS2Vasc 6  2/2 prior severe GI bleed on triple therapy  . PFO (patent foramen ovale)    Noted on TEE - L-R shunt (elsewhere reported it as a ASD)  . Severe mitral regurgitation by prior echocardiogram 09/2014   Seen on TEE to have severe MR with multiple jets. Vena contracted 0.8; Consideration had been ? Mitral E-Clip - put on hold after GI Bleed.  . Sick sinus syndrome (HCC)    Status post Loop Recorder: [employer] Linq -- most recent interrogation 05/19/2016: 2 new pauses. One greater than 3 seconds at 0 320 the morning. Second was 4.4 seconds at 9:56 PM; also noted to have an episode of A. fib. --> Recommended further follow-up upon arrival to [facility].  . Thoracic aortic atherosclerosis (HCC) 09/2014   Moderate grade 3-4 atheroma noted on TEE  . Urinary tract infection   . Vasovagal syncope    Exacerbated by bradycardia, sick sinus syndrome, and mitral regurgitation.  . Vertigo, central, unspecified laterality    Chronic dizziness.    Past Surgical History:  Procedure Laterality Date  . ABDOMINAL HYSTERECTOMY  2001  . Cataract Surgery  Bilateral   . COLONOSCOPY W/ BIOPSIES  2006   2 polyps noted along with diverticulitis  . CORONARY ANGIOPLASTY WITH STENT PLACEMENT  11/2014   [facility]: 90% RCA - PCI with Promus DES.  [name] implanted cardiac monitor     . LOOP RECORDER INSERTION  07/24/2013   Medtronic Linq - to evaluate SSS for symptomatic bradycardia  . Right and Left Heart Catheterization  11/2014   RHC: RAP 4, RVP 32/4, PA peak 29/7/16. LVEDP 18.  CORS: 30% LM, 50% LAD, 90% RCA --> PCI  (Promus DES)  . TEE WITHOUT CARDIOVERSION  09/2014   Normal LV function. Severe MR with multiple jets. Vena contractile 0.8.  PFO with L-R shunt; moderate grade 3-4 aortic atheroma  . TONSILLECTOMY    . TRANSTHORACIC ECHOCARDIOGRAM  07/2017   EF 60 to 65%.  Moderate-severe MR.  Moderate TR with elevated RVSP  . TRANSTHORACIC ECHOCARDIOGRAM  08/2014   Normal LV function with biatrial enlargement. Moderate-severe mitral and tricuspid insufficiency.     Current Meds  Medication Sig  . Acetaminophen (TYLENOL PO) Take 650 mg by mouth as directed. daily  . aspirin EC 81 MG tablet Take 81 mg by mouth daily.  . Carboxymethylcellul-Glycerin (REFRESH OPTIVE OP) Place 1 drop into both eyes daily as needed (dry eyes).  . ferrous sulfate 325 (65 FE) MG tablet Take 325 mg by mouth 2 (two) times daily with a meal.   . fluticasone (FLONASE) 50 MCG/ACT nasal spray Place 2 sprays into both nostrils daily as needed for allergies or rhinitis.   . furosemide (LASIX) 20 MG tablet TAKE 1 TABLET BY MOUTH EVERY OTHER DAY. (Patient taking differently:  TAKE 1 TABLET (20 MG)  BY MOUTH EVERY OTHER DAY.)  . levothyroxine (SYNTHROID, LEVOTHROID) 75 MCG tablet TAKE 1 TABLET BY MOUTH ONCE A DAY BEFORE BREAKFAST (Patient taking differently: TAKE 1 TABLET (75 MCG) BY MOUTH ONCE A DAY BEFORE BREAKFAST)  . meclizine (ANTIVERT) 25 MG tablet Take 0.5-1 tablets (12.5-25 mg total) by mouth 3 (three) times daily as needed for dizziness.  . meloxicam (MOBIC) 7.5 MG tablet Take 1 tablet (7.5 mg total) by mouth daily. (Patient taking differently: Take 7.5 mg by mouth daily as needed for pain. )  . omeprazole (PRILOSEC) 20 MG capsule TAKE 1 CAPSULE  BY MOUTH DAILY. (Patient taking differently: TAKE 1 CAPSULE (20 MG) BY MOUTH DAILY.)  . polyethylene glycol (MIRALAX / GLYCOLAX) packet Take 8.5 g by mouth See admin instructions. Mix 1/2 scoop (8.5 g) in 8 oz orange juice and drink daily  . potassium chloride (K-DUR,KLOR-CON) 10 MEQ tablet  TAKE 1 TABLET BY MOUTH TWICE A DAY (Patient taking differently: TAKE 1 TABLET (10 MEQ) BY MOUTH TWICE A DAY)    Allergies  Allergen Reactions  . Carvedilol Other (See Comments)    Bradycardia   . Diltiazem Other (See Comments)    Bradycardia  . Lopressor [Metoprolol] Other (See Comments)    Bradycardia    Social History   Tobacco Use  . Smoking status: Former Smoker    Packs/day: 0.50    Years: 5.00    Pack years: 2.50    Types: Cigarettes    Last attempt to quit: 08/02/1970    Years since quitting: 47.9  . Smokeless tobacco: Never Used  Substance Use Topics  . Alcohol use: Yes    Comment: "wine occasionally"  . Drug use: No   Social History   Social History Narrative   Married for 59 years    Has a son, daughter died of breast cancer   One grandson    Moved from [place] -( Lives there from May- October) to be closer to their son & only remaining family.     family history includes Cancer in her brother and daughter; Diabetes in her brother; Stomach cancer in her father.  Wt Readings from Last 3 Encounters:  07/10/18 125 lb 4 oz (56.8 kg)  01/16/18 119 lb 12.8 oz (54.3 kg)  01/10/18 121 lb (54.9 kg)    PHYSICAL EXAM BP (!) 136/94   Pulse 82   Ht 5\' 5"  (1.651 m)   Wt 125 lb 4 oz (56.8 kg)   LMP  (LMP Unknown)   SpO2 96%   BMI 20.84 kg/m   Physical Exam  Constitutional: She is oriented to person, place, and time. No distress.  Very thin, frail elderly.   She has significant kyphoscoliosis and is a mouth breather.   HENT:  Head: Normocephalic and atraumatic.  Neck: No hepatojugular reflux (Versus palpable carotid) and no JVD present. Carotid bruit is present (Difficult to tell if his carotid bruit or simply radiated murmur.  Probably more murmur.).  Cardiovascular: Normal rate, regular rhythm, S1 normal and S2 normal.  Occasional extrasystoles are present. PMI is not displaced. Exam reveals decreased pulses (Difficult to palpate pedal pulses, but  present). Exam reveals no gallop.  Murmur heard.  Harsh crescendo-decrescendo midsystolic murmur is present with a grade of 1/6 at the upper right sternal border radiating to the neck. High-pitched blowing holosystolic murmur of grade 2/6 is also present at the apex radiating to the back. Pulmonary/Chest: Effort normal. No respiratory distress. She  has no wheezes. She has no rales.  Mouth breathing.  Mild interstitial sounds, but no rales or  Abdominal: Soft. Bowel sounds are normal. She exhibits no distension. There is no tenderness. There is no rebound.  Musculoskeletal: Normal range of motion. She exhibits no edema (Really no detectable edema with support stockings in place).  Neurological: She is alert and oriented to person, place, and time.  Seated, awake and alert.  Poor center balance.  Psychiatric: She has a normal mood and affect. Her behavior is normal. Thought content normal.  She seems very different than her normal self.  Very anxious with his staring glare.  Almost as though she is frightened.  Vitals reviewed.   Adult ECG Report -NA  Other studies Reviewed: Additional studies/ records that were reviewed today include:  Recent Labs:    Lab Results  Component Value Date   CHOL 169 07/19/2017   HDL 54.80 07/19/2017   LDLCALC 83 07/19/2017   TRIG 152.0 (H) 07/19/2017   CHOLHDL 3 07/19/2017    ASSESSMENT / PLAN:  Problem List Items Addressed This Visit    CAD S/P DES PCI to RCA (Chronic)    No angina. Back on aspirin because of history of GI bleed no longer on Plavix. Not on beta-blocker or ACE inhibitor/ARB because of low blood pressure and bradycardia concerns. We also stopped her statin because of feeling poorly. Per discussion with the patient and her husband, or not to push lipids further.  Further with lipid management.      Chronic diastolic heart failure due to valvular disease (HCC) (Chronic)    Seems relatively euvolemic.  She does well with a support  stockings and low-dose Lasix which she can increase PRN.  Otherwise for reasons already discussed, not on ACE inhibitor/ARB or beta-blocker.      Coronary artery disease involving native heart without angina pectoris - Primary (Chronic)   Essential hypertension (Chronic)    Pressures look fine today.  I am fine with permissive hypertension to allow blood pressures as high as 170 mmHg.  Her dizziness is notably improved since backing off on any medications.  Benefits outweigh the risks.      Mitral regurgitation and aortic stenosis (Chronic)    Moderate.  She and her husband indicate that they probably would not be interested in any procedures.  At this point I think we can hold off on doing any further echo evaluations unless symptoms warrant.      Paroxysmal atrial fibrillation (HCC): CHA2DS2Vasc = 6; Not currently on AC. (Chronic)    Does not seem like she had any breakthrough spells.  No symptoms at least.  Avoiding rate or rhythm control agents. Because of falls and weakness/fatigue we only using aspirin for stroke prophylaxis.  Not on intake regulation because of concerns of falls and her overall health.      SSS (sick sinus syndrome) (HCC) (Chronic)    She has had some A. fib in the past, but nothing to suggest any recurrence.  Avoid any rate controlling agents.      Vasovagal syncope (Chronic)    Thankfully no further episodes.  Make sure that she stays adequately hydrated.  We have taken away all of her antihypertensive agents.  No longer taking meclizine        Current medicines are reviewed at length with the patient today. (+/- concerns) None The following changes have been made: None  Patient Instructions  Medication Instructions:  No changes  If you need  a refill on your cardiac medications before your next appointment, please call your pharmacy.   Lab work: Not needed If you have labs (blood work) drawn today and your tests are completely normal, you will receive  your results only by: [name] MyChart Message (if you have MyChart) OR . A paper copy in the mail If you have any lab test that is abnormal or we need to change your treatment, we will call you to review the results.  Testing/Procedures: Not needed  Follow-Up: At [facility], you and your health needs are our priority.  As part of our continuing mission to provide you with exceptional heart care, we have created designated Provider Care Teams.  These Care Teams include your primary Cardiologist (physician) and Advanced Practice Providers (APPs -  Physician Assistants and Nurse Practitioners) who all work together to provide you with the care you need, when you need it. You will need a follow up appointment in 7 months May 2020.  Please call our office 2 months in advance to schedule this appointment.  You may see Bryan Lemma, MD or one of the following Advanced Practice Providers on your designated Care Team:   Theodore Demark, PA-C . Joni Reining, DNP, ANP  Any Other Special Instructions Will Be Listed Below (If Applicable).     Studies Ordered:   No orders of the defined types were placed in this encounter.     Bryan Lemma, M.D., M.S. Interventional Cardiologist   Pager # [phone] Phone # [phone] [address] [place], [place] [address]

## 2018-07-10 NOTE — Patient Instructions (Signed)
Medication Instructions:  No changes  If you need a refill on your cardiac medications before your next appointment, please call your pharmacy.   Lab work: Not needed If you have labs (blood work) drawn today and your tests are completely normal, you will receive your results only by: [name] MyChart Message (if you have MyChart) OR . A paper copy in the mail If you have any lab test that is abnormal or we need to change your treatment, we will call you to review the results.  Testing/Procedures: Not needed  Follow-Up: At [facility], you and your health needs are our priority.  As part of our continuing mission to provide you with exceptional heart care, we have created designated Provider Care Teams.  These Care Teams include your primary Cardiologist (physician) and Advanced Practice Providers (APPs -  Physician Assistants and Nurse Practitioners) who all work together to provide you with the care you need, when you need it. You will need a follow up appointment in 7 months May 2020.  Please call our office 2 months in advance to schedule this appointment.  You may see Bryan Lemma, MD or one of the following Advanced Practice Providers on your designated Care Team:   Theodore Demark, PA-C . Joni Reining, DNP, ANP  Any Other Special Instructions Will Be Listed Below (If Applicable).

## 2018-07-13 ENCOUNTER — Encounter: Payer: Self-pay | Admitting: Cardiology

## 2018-07-13 NOTE — Assessment & Plan Note (Signed)
Seems relatively euvolemic.  She does well with a support stockings and low-dose Lasix which she can increase PRN.  Otherwise for reasons already discussed, not on ACE inhibitor/ARB or beta-blocker.

## 2018-07-13 NOTE — Assessment & Plan Note (Signed)
No angina. Back on aspirin because of history of GI bleed no longer on Plavix. Not on beta-blocker or ACE inhibitor/ARB because of low blood pressure and bradycardia concerns. We also stopped her statin because of feeling poorly. Per discussion with the patient and her husband, or not to push lipids further.  Further with lipid management.

## 2018-07-13 NOTE — Assessment & Plan Note (Signed)
Pressures look fine today.  I am fine with permissive hypertension to allow blood pressures as high as 170 mmHg.  Her dizziness is notably improved since backing off on any medications.  Benefits outweigh the risks.

## 2018-07-13 NOTE — Assessment & Plan Note (Signed)
Does not seem like she had any breakthrough spells.  No symptoms at least.  Avoiding rate or rhythm control agents. Because of falls and weakness/fatigue we only using aspirin for stroke prophylaxis.  Not on intake regulation because of concerns of falls and her overall health.

## 2018-07-13 NOTE — Assessment & Plan Note (Addendum)
Moderate.  She and her husband indicate that they probably would not be interested in any procedures.  At this point I think we can hold off on doing any further echo evaluations unless symptoms warrant.

## 2018-07-13 NOTE — Assessment & Plan Note (Signed)
Thankfully no further episodes.  Make sure that she stays adequately hydrated.  We have taken away all of her antihypertensive agents.  No longer taking meclizine

## 2018-07-13 NOTE — Assessment & Plan Note (Signed)
She has had some A. fib in the past, but nothing to suggest any recurrence.  Avoid any rate controlling agents.

## 2018-09-09 ENCOUNTER — Other Ambulatory Visit: Payer: Self-pay | Admitting: Adult Health

## 2018-09-09 DIAGNOSIS — R42 Dizziness and giddiness: Secondary | ICD-10-CM

## 2018-09-10 MED ORDER — MECLIZINE HCL 25 MG PO TABS: 12.5000 mg | ORAL_TABLET | Freq: Three times a day (TID) | ORAL | 3 refills | Status: DC | PRN

## 2018-12-14 ENCOUNTER — Other Ambulatory Visit: Payer: Self-pay | Admitting: Adult Health

## 2018-12-16 NOTE — Telephone Encounter (Signed)
Pt has upcoming appt.  Sent to the pharmacy by e-scribe for 90 days.

## 2019-01-09 ENCOUNTER — Telehealth: Payer: Self-pay | Admitting: Cardiology

## 2019-01-09 NOTE — Telephone Encounter (Signed)
Mychart, no smartphone ( visit will be done on IPad) pre reg complete 01/09/19 AF

## 2019-01-13 ENCOUNTER — Telehealth: Payer: Self-pay | Admitting: *Deleted

## 2019-01-13 ENCOUNTER — Encounter: Payer: Self-pay | Admitting: Cardiology

## 2019-01-13 ENCOUNTER — Telehealth (INDEPENDENT_AMBULATORY_CARE_PROVIDER_SITE_OTHER): Payer: Medicare HMO | Admitting: Cardiology

## 2019-01-13 VITALS — BP 145/78 | HR 73 | Ht 65.0 in | Wt 125.0 lb

## 2019-01-13 DIAGNOSIS — I5032 Chronic diastolic (congestive) heart failure: Secondary | ICD-10-CM

## 2019-01-13 DIAGNOSIS — I480 Paroxysmal atrial fibrillation: Secondary | ICD-10-CM

## 2019-01-13 DIAGNOSIS — I080 Rheumatic disorders of both mitral and aortic valves: Secondary | ICD-10-CM

## 2019-01-13 DIAGNOSIS — I10 Essential (primary) hypertension: Secondary | ICD-10-CM

## 2019-01-13 DIAGNOSIS — I2510 Atherosclerotic heart disease of native coronary artery without angina pectoris: Secondary | ICD-10-CM

## 2019-01-13 DIAGNOSIS — I38 Endocarditis, valve unspecified: Secondary | ICD-10-CM

## 2019-01-13 DIAGNOSIS — M7989 Other specified soft tissue disorders: Secondary | ICD-10-CM | POA: Insufficient documentation

## 2019-01-13 DIAGNOSIS — R55 Syncope and collapse: Secondary | ICD-10-CM

## 2019-01-13 MED ORDER — FUROSEMIDE 20 MG PO TABS: ORAL_TABLET | ORAL | 2 refills | Status: DC

## 2019-01-13 NOTE — Patient Instructions (Addendum)
Medication Instructions:  No changes -- OK to take Lasix more than every other day  If you need a refill on your cardiac medications before your next appointment, please call your pharmacy.   Lab work: NOT NEEDED   Testing/Procedures: NOT NEEDED  Follow-Up: At [facility], you and your health needs are our priority.  As part of our continuing mission to provide you with exceptional heart care, we have created designated Provider Care Teams.  These Care Teams include your primary Cardiologist (physician) and Advanced Practice Providers (APPs -  Physician Assistants and Nurse Practitioners) who all work together to provide you with the care you need, when you need it. You will need a follow up appointment in 6 months OCT 2020.  Please call our office 2 months in advance to schedule this appointment.  You may see Bryan Lemma, MD or one of the following Advanced Practice Providers on your designated Care Team:   Theodore Demark, PA-C . Joni Reining, DNP, ANP  Any Other Special Instructions Will Be Listed Below (If Applicable).

## 2019-01-13 NOTE — Assessment & Plan Note (Signed)
Her blood pressure is appropriately in the systolic 141 and 50 mm range to avoid orthostatic changes or dizziness.  No longer on any medications.

## 2019-01-13 NOTE — Assessment & Plan Note (Signed)
She is not really having any sensation of the A. fib either fast or slow.  Her energy level is notably improved being off of the medications including diltiazem and digoxin.  As mentioned before, we have opted to avoid anticoagulation be on aspirin because of history of falls.

## 2019-01-13 NOTE — Assessment & Plan Note (Signed)
No further episodes since removing her medications.  Was exacerbated by her bradycardia and orthostatic hypotension.

## 2019-01-13 NOTE — Progress Notes (Signed)
Virtual Visit via Video Note   This visit type was conducted due to national recommendations for restrictions regarding the COVID-19 Pandemic (e.g. social distancing) in an effort to limit this patient's exposure and mitigate transmission in our community.  Due to her co-morbid illnesses, this patient is at least at moderate risk for complications without adequate follow up.  This format is felt to be most appropriate for this patient at this time.  All issues noted in this document were discussed and addressed.  A limited physical exam was performed with this format.  Please refer to the patient's chart for her consent to telehealth for [facility].   Patient has given verbal permission to conduct this visit via virtual appointment and to bill insurance 12/30/2018 6:17PM     Evaluation Performed:  Follow-up visit  Date:  01/13/2019   ID:  Deborah [name], DOB [birth year], MRN [id number]  Patient Location: Home Provider Location: Home  PCP:  Shirline Frees, NP  Cardiologist:  Bryan Lemma, MD  Electrophysiologist:  None   Chief Complaint: 63-month follow-up for CAD and PAF  History of Present Illness:    Deborah [name] is a 85 y.o. female with PMH notable for PAF (with bradycardia/tachybradycardia), CAD and severe MR along with HFpEF who presents via audio/video conferencing for a telehealth visit today.  Last year, she was noticing lightheadedness and dizziness which led to her stopping her digoxin and ARB along with reducing Lasix dosing to avoid orthostatic hypotension..  Was not interested in mitral valve reassessment, as she would not interested in invasive procedures.  Deborah [name] was last seen in October 2019.  She is feeling much better off of all the medications.  Husband noted that the last 6 months have been the best that she had in the last 3 to 4 years.  Much less dizziness and no real palpitations.  Much improved fatigue.  Was doing daily exercises.  Using walker and/or  shopping cart. Not really having major issues with edema with the assistance of compression stockings.  No additional Lasix. --> No changes  Interval History:  Deborah [name] is doing well -- still as well as last visit. Just a little bit of dizziness off & on - but manageable.  Planning to back up to [place] (their cottage) for the summer. Swelling controlled with QOD & support stockings.     Walking around -- limited b/c COVID.    Cardiovascular ROS: no chest pain or dyspnea on exertion positive for - stable dizziness & controlled swelling negative for - irregular heartbeat, loss of consciousness, orthopnea, palpitations, paroxysmal nocturnal dyspnea, rapid heart rate, shortness of breath or syncope / near syncope; TIA/amaurosis fugax  The patient does not have symptoms concerning for COVID-19 infection (fever, chills, cough, or new shortness of breath).  The patient is practicing social distancing.  Husband does the shopping.   ROS:  Please see the history of present illness.    Review of Systems  Constitutional: Negative for malaise/fatigue.  HENT: Negative for nosebleeds.   Respiratory: Negative for cough (off & on cough -not new) and shortness of breath.   Cardiovascular: Negative for palpitations and claudication.  Gastrointestinal: Negative for blood in stool and melena.       Dark stool from Iron  Genitourinary: Negative for hematuria.  Musculoskeletal: Negative for back pain.       OA doing well.  Neurological: Positive for dizziness (see above) and weakness (generalized). Negative for focal weakness.  Psychiatric/Behavioral: Negative.   All other systems  reviewed and are negative.   Vertigo, back pain, joint pain, global core muscle weakness.  Anxious  Past Medical History:  Diagnosis Date   Anxiety    Bradycardia    CAD S/P percutaneous coronary angioplasty 11/2014   DES PCI to RCA. Cath was done for pre-operative evaluation for mitral repair; LM 30%, LAD 50%, RCA 90% (Promus  DES)   Chronic diastolic heart failure due to valvular disease (HCC) 2015   Related to severe mitral regurgitation. Normal EF by echo.   Diverticulitis    Essential hypertension    H/O: upper GI bleed 12/03/2014   While on ASA/Plavix & Warfarin --> Hct dropped to 18%; small AVM noted but no overt pathology.   History of uterine cancer 2001   Status post hysterectomy   Hyperlipidemia with target LDL less than 70    Coronary disease and thoracic aortic atheroma   Hypothyroidism    On Levothyroxine   Paroxysmal atrial fibrillation (HCC) 2014   Associated with sick sinus syndrome. -- Intolerant of beta blockers and diltiazem. Rate control with digoxin. Not on anticoagulation despite CHA2DS2Vasc 6  2/2 prior severe GI bleed on triple therapy   PFO (patent foramen ovale)    Noted on TEE - L-R shunt (elsewhere reported it as a ASD)   Severe mitral regurgitation by prior echocardiogram 09/2014   Seen on TEE to have severe MR with multiple jets. Vena contracted 0.8; Consideration had been ? Mitral E-Clip - put on hold after GI Bleed.   Sick sinus syndrome [facility])    Status post Loop Recorder: Medtronic Linq -- most recent interrogation 05/19/2016: 2 new pauses. One greater than 3 seconds at 0 320 the morning. Second was 4.4 seconds at 9:56 PM; also noted to have an episode of A. fib. --> Recommended further follow-up upon arrival to [facility].   Thoracic aortic atherosclerosis (HCC) 09/2014   Moderate grade 3-4 atheroma noted on TEE   Urinary tract infection    Vasovagal syncope    Exacerbated by bradycardia, sick sinus syndrome, and mitral regurgitation.   Vertigo, central, unspecified laterality    Chronic dizziness.   Past Surgical History:  Procedure Laterality Date   ABDOMINAL HYSTERECTOMY  2001   Cataract Surgery  Bilateral    COLONOSCOPY W/ BIOPSIES  2006   2 polyps noted along with diverticulitis   CORONARY ANGIOPLASTY WITH STENT PLACEMENT  11/2014   [place]  [facility]: 90% RCA - PCI with Promus DES.   implanted cardiac monitor      LOOP RECORDER INSERTION  07/24/2013   Medtronic Linq - to evaluate SSS for symptomatic bradycardia   Right and Left Heart Catheterization  11/2014   RHC: RAP 4, RVP 32/4, PA peak 29/7/16. LVEDP 18.  CORS: 30% LM, 50% LAD, 90% RCA --> PCI (Promus DES)   TEE WITHOUT CARDIOVERSION  09/2014   Normal LV function. Severe MR with multiple jets. Vena contractile 0.8.  PFO with L-R shunt; moderate grade 3-4 aortic atheroma   TONSILLECTOMY     TRANSTHORACIC ECHOCARDIOGRAM  07/2017   EF 60 to 65%.  Moderate-severe MR.  Moderate TR with elevated RVSP   TRANSTHORACIC ECHOCARDIOGRAM  08/2014   Normal LV function with biatrial enlargement. Moderate-severe mitral and tricuspid insufficiency.     Current Meds  Medication Sig   Acetaminophen (TYLENOL PO) Take 650 mg by mouth as directed. daily   aspirin EC 81 MG tablet Take 81 mg by mouth daily.   Carboxymethylcellul-Glycerin (REFRESH  OPTIVE OP) Place 1 drop into both eyes daily as needed (dry eyes).   ferrous sulfate 325 (65 FE) MG tablet Take 325 mg by mouth 2 (two) times daily with a meal.    fluticasone (FLONASE) 50 MCG/ACT nasal spray Place 2 sprays into both nostrils daily as needed for allergies or rhinitis.    furosemide (LASIX) 20 MG tablet TAKE 1 TABLET (20 MG)  BY MOUTH EVERY OTHER DAY AND MAY TAKE AN ADDITIONAL 20 MG ON THE ODD DAYS IF NEEDED.   levothyroxine (SYNTHROID, LEVOTHROID) 75 MCG tablet TAKE 1 TABLET BY MOUTH ONCE A DAY BEFORE BREAKFAST   meclizine (ANTIVERT) 25 MG tablet Take 0.5-1 tablets (12.5-25 mg total) by mouth 3 (three) times daily as needed for dizziness.   omeprazole (PRILOSEC) 20 MG capsule TAKE 1 CAPSULE  BY MOUTH DAILY.   polyethylene glycol (MIRALAX / GLYCOLAX) packet Take 8.5 g by mouth See admin instructions. Mix 1/2 scoop (8.5 g) in 8 oz orange juice and drink daily   potassium chloride  (K-DUR,KLOR-CON) 10 MEQ tablet TAKE 1 TABLET BY MOUTH TWICE A DAY   [DISCONTINUED] furosemide (LASIX) 20 MG tablet TAKE 1 TABLET BY MOUTH EVERY OTHER DAY. (Patient taking differently: TAKE 1 TABLET (20 MG)  BY MOUTH EVERY OTHER DAY.)     Allergies:   Carvedilol; Diltiazem; and Lopressor [metoprolol]   Social History   Tobacco Use   Smoking status: Former Smoker    Packs/day: 0.50    Years: 5.00    Pack years: 2.50    Types: Cigarettes    Last attempt to quit: 08/02/1970    Years since quitting: 48.4   Smokeless tobacco: Never Used  Substance Use Topics   Alcohol use: Yes    Comment: "wine occasionally"   Drug use: No     Family Hx: The patient's family history includes Cancer in her brother and daughter; Diabetes in her brother; Stomach cancer in her father.   Prior CV studies:   The following studies were reviewed today:  none  Labs/Other Tests and Data Reviewed:    EKG:  No ECG reviewed.  Recent Labs: No results found for requested labs within last 8760 hours.   Recent Lipid Panel Lab Results  Component Value Date/Time   CHOL 169 07/19/2017 10:13 AM   TRIG 152.0 (H) 07/19/2017 10:13 AM   HDL 54.80 07/19/2017 10:13 AM   CHOLHDL 3 07/19/2017 10:13 AM   LDLCALC 83 07/19/2017 10:13 AM    Wt Readings from Last 3 Encounters:  01/13/19 125 lb (56.7 kg)  07/10/18 125 lb 4 oz (56.8 kg)  01/16/18 119 lb 12.8 oz (54.3 kg)     Objective:    Vital Signs:  BP (!) 145/78    Pulse 73    Ht 5\' 5"  (1.651 m)    Wt 125 lb (56.7 kg)    LMP  (LMP Unknown)    BMI 20.80 kg/m   VITAL SIGNS:  reviewed GEN:  Well nourished, well developed female in no acute distress. RESPIRATORY:  normal respiratory effort, symmetric expansion NEURO:  alert and oriented x 3, no obvious focal deficit PSYCH:  normal affect No obvious rash/ lesion  ASSESSMENT & PLAN:    Overall, [name] is doing very well having minimized her medications. The only potential suggestion I have made is that  she she may be able to take an additional dose of Lasix for swelling either on the day of the dose versus taking it every day for couple days  and then going back to every other day.  Problem List Items Addressed This Visit    Chronic diastolic heart failure due to valvular disease (HCC) (Chronic)   Relevant Medications   furosemide (LASIX) 20 MG tablet   Coronary artery disease involving native heart without angina pectoris (Chronic)    She has a history of CAD but is not having any angina.  Is on aspirin as her only real cardiac medication besides Lasix.  No longer on medication such as beta-blocker and statin because of dizziness fatigue and overall weakness.  Given her age and other comorbidities we have elected to simply treat symptomatically and no further testing.      Relevant Medications   furosemide (LASIX) 20 MG tablet   Essential hypertension (Chronic)    Her blood pressure is appropriately in the systolic 141 and 50 mm range to avoid orthostatic changes or dizziness.  No longer on any medications.      Relevant Medications   furosemide (LASIX) 20 MG tablet   Leg swelling    Edema is well controlled with her every other day Lasix, but she may have sometimes when it would be beneficial for her to take an additional dose for more swelling than usual, so I suggested that it would be reasonable for her to take an additional dose or take it every day for couple days and then go back to every other day.      Mitral regurgitation and aortic stenosis (Chronic)    She does have aortic stenosis and mitral regurgitation which we are simply following symptomatically.  Based on discussions in the past we are not going to be doing further studies as she would not want to be doing any invasive evaluation or surgeries.      Relevant Medications   furosemide (LASIX) 20 MG tablet   Paroxysmal atrial fibrillation (HCC): CHA2DS2Vasc = 6; Not currently on AC. - Primary (Chronic)    She is not  really having any sensation of the A. fib either fast or slow.  Her energy level is notably improved being off of the medications including diltiazem and digoxin.  As mentioned before, we have opted to avoid anticoagulation be on aspirin because of history of falls.      Relevant Medications   furosemide (LASIX) 20 MG tablet   Vasovagal syncope (Chronic)    No further episodes since removing her medications.  Was exacerbated by her bradycardia and orthostatic hypotension.      Relevant Medications   furosemide (LASIX) 20 MG tablet       COVID-19 Education: The signs and symptoms of COVID-19 were discussed with the patient and how to seek care for testing (follow up with PCP or arrange E-visit).   The importance of social distancing was discussed today.  Time:   Today, I have spent 22 minutes with the patient with telehealth technology discussing the above problems.     Medication Adjustments/Labs and Tests Ordered: Current medicines are reviewed at length with the patient today.  Concerns regarding medicines are outlined above.  Medication Instructions:  No changes -- OK to take Lasix more than every other day  Tests Ordered: No orders of the defined types were placed in this encounter.  No test  Medication Changes: Meds ordered this encounter  Medications   furosemide (LASIX) 20 MG tablet    Sig: TAKE 1 TABLET (20 MG)  BY MOUTH EVERY OTHER DAY AND MAY TAKE AN ADDITIONAL 20 MG ON THE ODD DAYS  IF NEEDED.    Dispense:  45 tablet    Refill:  2   none  Disposition:  Follow up in 6 month(s)    Signed, Bryan Lemma, MD  01/13/2019 3:37 PM    [facility]

## 2019-01-13 NOTE — Assessment & Plan Note (Signed)
She does have aortic stenosis and mitral regurgitation which we are simply following symptomatically.  Based on discussions in the past we are not going to be doing further studies as she would not want to be doing any invasive evaluation or surgeries.

## 2019-01-13 NOTE — Telephone Encounter (Signed)
SPOKE TO HUSBAND - INSTRUCTION FROM TELE-VISIT DR HARDING GIVEN-- AVS SUMMARY SENT VIA MYCHART VOICED UNDERSTANDING.

## 2019-01-13 NOTE — Assessment & Plan Note (Signed)
Edema is well controlled with her every other day Lasix, but she may have sometimes when it would be beneficial for her to take an additional dose for more swelling than usual, so I suggested that it would be reasonable for her to take an additional dose or take it every day for couple days and then go back to every other day.

## 2019-01-13 NOTE — Assessment & Plan Note (Signed)
She has a history of CAD but is not having any angina.  Is on aspirin as her only real cardiac medication besides Lasix.  No longer on medication such as beta-blocker and statin because of dizziness fatigue and overall weakness.  Given her age and other comorbidities we have elected to simply treat symptomatically and no further testing.

## 2019-05-15 ENCOUNTER — Other Ambulatory Visit: Payer: Self-pay | Admitting: Family Medicine

## 2019-05-16 MED ORDER — LEVOTHYROXINE SODIUM 75 MCG PO TABS: ORAL_TABLET | ORAL | 0 refills | Status: DC

## 2019-05-16 MED ORDER — OMEPRAZOLE 20 MG PO CPDR: 20.0000 mg | DELAYED_RELEASE_CAPSULE | Freq: Every day | ORAL | 0 refills | Status: DC

## 2019-05-16 MED ORDER — POTASSIUM CHLORIDE CRYS ER 10 MEQ PO TBCR: 10.0000 meq | EXTENDED_RELEASE_TABLET | Freq: Two times a day (BID) | ORAL | 0 refills | Status: DC

## 2019-07-02 ENCOUNTER — Emergency Department (HOSPITAL_COMMUNITY): Payer: Medicare HMO

## 2019-07-02 ENCOUNTER — Other Ambulatory Visit: Payer: Self-pay

## 2019-07-02 ENCOUNTER — Emergency Department (HOSPITAL_COMMUNITY)
Admission: EM | Admit: 2019-07-02 | Discharge: 2019-07-02 | Disposition: A | Discharge status: Home / Self Care | Payer: Medicare HMO | Attending: Emergency Medicine | Admitting: Emergency Medicine

## 2019-07-02 DIAGNOSIS — I5032 Chronic diastolic (congestive) heart failure: Secondary | ICD-10-CM | POA: Insufficient documentation

## 2019-07-02 DIAGNOSIS — R42 Dizziness and giddiness: Secondary | ICD-10-CM | POA: Diagnosis not present

## 2019-07-02 DIAGNOSIS — Z87891 Personal history of nicotine dependence: Secondary | ICD-10-CM | POA: Insufficient documentation

## 2019-07-02 DIAGNOSIS — E039 Hypothyroidism, unspecified: Secondary | ICD-10-CM | POA: Insufficient documentation

## 2019-07-02 DIAGNOSIS — Z79899 Other long term (current) drug therapy: Secondary | ICD-10-CM | POA: Insufficient documentation

## 2019-07-02 DIAGNOSIS — Z7982 Long term (current) use of aspirin: Secondary | ICD-10-CM | POA: Insufficient documentation

## 2019-07-02 DIAGNOSIS — I10 Essential (primary) hypertension: Secondary | ICD-10-CM | POA: Diagnosis not present

## 2019-07-02 DIAGNOSIS — I2510 Atherosclerotic heart disease of native coronary artery without angina pectoris: Secondary | ICD-10-CM | POA: Diagnosis not present

## 2019-07-02 DIAGNOSIS — R0902 Hypoxemia: Secondary | ICD-10-CM | POA: Diagnosis not present

## 2019-07-02 DIAGNOSIS — I4891 Unspecified atrial fibrillation: Secondary | ICD-10-CM | POA: Diagnosis not present

## 2019-07-02 DIAGNOSIS — R55 Syncope and collapse: Secondary | ICD-10-CM | POA: Insufficient documentation

## 2019-07-02 DIAGNOSIS — I440 Atrioventricular block, first degree: Secondary | ICD-10-CM | POA: Diagnosis not present

## 2019-07-02 DIAGNOSIS — R52 Pain, unspecified: Secondary | ICD-10-CM | POA: Diagnosis not present

## 2019-07-02 DIAGNOSIS — R4020 Unspecified coma: Secondary | ICD-10-CM | POA: Diagnosis not present

## 2019-07-02 LAB — COMPREHENSIVE METABOLIC PANEL
ALT: 13 U/L (ref 0–44)
AST: 14 U/L — ABNORMAL LOW (ref 15–41)
Albumin: 3.3 g/dL — ABNORMAL LOW (ref 3.5–5.0)
Alkaline Phosphatase: 56 U/L (ref 38–126)
Anion gap: 9 (ref 5–15)
BUN: 14 mg/dL (ref 8–23)
CO2: 22 mmol/L (ref 22–32)
Calcium: 8.8 mg/dL — ABNORMAL LOW (ref 8.9–10.3)
Chloride: 100 mmol/L (ref 98–111)
Creatinine, Ser: 0.95 mg/dL (ref 0.44–1.00)
GFR calc Af Amer: >60 mL/min (ref >60)
GFR calc non Af Amer: 54 mL/min — ABNORMAL LOW (ref >60)
Glucose, Bld: 136 mg/dL — ABNORMAL HIGH (ref 70–99)
Potassium: 3.9 mmol/L (ref 3.5–5.1)
Sodium: 131 mmol/L — ABNORMAL LOW (ref 135–145)
Total Bilirubin: 0.4 mg/dL (ref 0.3–1.2)
Total Protein: 5.7 g/dL — ABNORMAL LOW (ref 6.5–8.1)

## 2019-07-02 LAB — CBC WITH DIFFERENTIAL/PLATELET
Abs Immature Granulocytes: 0.05 10*3/uL (ref 0.00–0.07)
Basophils Absolute: 0.1 10*3/uL (ref 0.0–0.1)
Basophils Relative: 0 %
Eosinophils Absolute: 0.1 10*3/uL (ref 0.0–0.5)
Eosinophils Relative: 1 %
HCT: 37 % (ref 36.0–46.0)
Hemoglobin: 11.9 g/dL — ABNORMAL LOW (ref 12.0–15.0)
Immature Granulocytes: 0 %
Lymphocytes Relative: 6 %
Lymphs Abs: 0.7 10*3/uL (ref 0.7–4.0)
MCH: 30.7 pg (ref 26.0–34.0)
MCHC: 32.2 g/dL (ref 30.0–36.0)
MCV: 95.6 fL (ref 80.0–100.0)
Monocytes Absolute: 0.5 10*3/uL (ref 0.1–1.0)
Monocytes Relative: 4 %
Neutro Abs: 10.2 10*3/uL — ABNORMAL HIGH (ref 1.7–7.7)
Neutrophils Relative %: 89 %
Platelets: 331 10*3/uL (ref 150–400)
RBC: 3.87 MIL/uL (ref 3.87–5.11)
RDW: 13.3 % (ref 11.5–15.5)
WBC: 11.5 10*3/uL — ABNORMAL HIGH (ref 4.0–10.5)
nRBC: 0 % (ref 0.0–0.2)

## 2019-07-02 LAB — URINALYSIS, ROUTINE W REFLEX MICROSCOPIC
Bilirubin Urine: NEGATIVE
Glucose, UA: NEGATIVE mg/dL
Ketones, ur: NEGATIVE mg/dL
Nitrite: NEGATIVE
Protein, ur: NEGATIVE mg/dL
Specific Gravity, Urine: 1.009 (ref 1.005–1.030)
pH: 7 (ref 5.0–8.0)

## 2019-07-02 LAB — RAPID URINE DRUG SCREEN, HOSP PERFORMED
Amphetamines: NOT DETECTED
Barbiturates: NOT DETECTED
Benzodiazepines: NOT DETECTED
Cocaine: NOT DETECTED
Opiates: NOT DETECTED
Tetrahydrocannabinol: NOT DETECTED

## 2019-07-02 LAB — I-STAT CHEM 8, ED
BUN: 17 mg/dL (ref 8–23)
Calcium, Ion: 1.06 mmol/L — ABNORMAL LOW (ref 1.15–1.40)
Chloride: 99 mmol/L (ref 98–111)
Creatinine, Ser: 0.9 mg/dL (ref 0.44–1.00)
Glucose, Bld: 121 mg/dL — ABNORMAL HIGH (ref 70–99)
HCT: 38 % (ref 36.0–46.0)
Hemoglobin: 12.9 g/dL (ref 12.0–15.0)
Potassium: 4.2 mmol/L (ref 3.5–5.1)
Sodium: 130 mmol/L — ABNORMAL LOW (ref 135–145)
TCO2: 24 mmol/L (ref 22–32)

## 2019-07-02 LAB — PROTIME-INR
INR: 1 (ref 0.8–1.2)
Prothrombin Time: 13.3 seconds (ref 11.4–15.2)

## 2019-07-02 LAB — ETHANOL: Alcohol, Ethyl (B): <10 mg/dL (ref <10)

## 2019-07-02 LAB — APTT: aPTT: 30 seconds (ref 24–36)

## 2019-07-02 LAB — TROPONIN I (HIGH SENSITIVITY)
Troponin I (High Sensitivity): 6 ng/L (ref <18)
Troponin I (High Sensitivity): 7 ng/L (ref <18)

## 2019-07-02 NOTE — ED Provider Notes (Signed)
  Physical Exam  BP 117/76   Pulse 68   Temp 98.1 F (36.7 C) (Oral)   Resp 18   LMP  (LMP Unknown)   SpO2 97%   Physical Exam  ED Course/Procedures     Procedures  MDM  Received patient in signout.  Near syncopal episode.  May have some dehydration.  Mental status near baseline now.  MRI done and did not show stroke.  Discharge home with outpatient follow-up.  Has had previous syncope work-up.       Davonna Belling, MD 07/02/19 1225

## 2019-07-02 NOTE — ED Triage Notes (Signed)
Pt presents from home BIB [facility] due to a near syncope episode. Per EMS patient was feeling lightheaded/dizzy for much of the day. History of vertigo. This evening her family found her in the bathroom slumped over "pale, sweaty, cold, and disoriented." Patient presents alert, c/o nausea, oriented x3.

## 2019-07-02 NOTE — ED Notes (Signed)
Sent a urine culture with the urine specimen 

## 2019-07-02 NOTE — ED Notes (Signed)
Patient verbalizes understanding of discharge instructions. Opportunity for questioning and answers were provided. Armband removed by staff, pt discharged from ED.  

## 2019-07-02 NOTE — ED Notes (Signed)
Pt returned from MRI °

## 2019-07-02 NOTE — ED Provider Notes (Signed)
[facility] EMERGENCY DEPARTMENT Provider Note   CSN: [id number] Arrival date & time: 07/02/19  0146     History   Chief Complaint Chief Complaint  Patient presents with   Near Syncope    HPI Deborah [name] is a 85 y.o. female.     HPI  This is an 85 year old female with a history of coronary artery disease, hypertension, hyperlipidemia, atrial fibrillation and recent memory issues who presents with near syncope.  Per the patient's husband she had a normal day.  Over the last year he has noted some memory issues but otherwise she performs her own ADLs without issue.  No formal diagnosis of dementia.  Patient was noted to go to the bathroom.  Her husband checked on her approximately 40 minutes later and noted that she was pale, diaphoretic, and "not responding normally to me."  He reports that he was able to get her to the bed and "she went out on me several times."  He did not note any seizure-like activity.  He did not note any focal abnormalities.  He has noted that her speech has been slow since that time.  Patient stated that at the time she felt lightheaded.  She remembers the incident and states that right now she feels "okay but not quite right."  She is concerned that her speech is not normal.  She denies any physical pain including chest pain, shortness of breath, abdominal pain, nausea, vomiting.  Past Medical History:  Diagnosis Date   Anxiety    Bradycardia    CAD S/P percutaneous coronary angioplasty 11/2014   DES PCI to RCA. 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 Normal EF by echo.   Diverticulitis    Essential hypertension    H/O: upper GI bleed 12/03/2014   While on ASA/Plavix & Warfarin --> Hct dropped to 18%; small AVM noted but no overt pathology.   History of uterine  cancer 2001   Status post hysterectomy   Hyperlipidemia with target LDL less than 70    Coronary disease and thoracic aortic atheroma   Hypothyroidism    On Levothyroxine   Paroxysmal atrial fibrillation (HCC) 2014   Associated with sick sinus syndrome. -- Intolerant of beta blockers and diltiazem. Rate control with digoxin. Not on anticoagulation despite CHA2DS2Vasc 6  2/2 prior severe GI bleed on triple therapy   PFO (patent foramen ovale)    Noted on TEE - L-R shunt (elsewhere reported it as a ASD)   Severe mitral regurgitation by prior echocardiogram 09/2014   Seen on TEE to have severe MR with multiple jets. Vena contracted 0.8; Consideration had been ? Mitral E-Clip - put on hold after GI Bleed.   Sick sinus syndrome [facility])    Status post Loop Recorder: Medtr[employer] Linq -- most recent interrogation 05/19/2016: 2 new pauses. One greater than 3 seconds at 0 320 the morning. Second was 4.4 seconds at 9:56 PM; also noted to have an episode of A. fib. --> Recommended further follow-up upon arrival to [facility].   Thoracic aortic atherosclerosis (HCC) 09/2014   Moderate grade 3-4 atheroma noted on TEE   Urinary tract infection    Vasovagal syncope    Exacerbated by bradycardia, sick sinus syndrome, and mitral regurgitation.   Vertigo, central, unspecified laterality    Chronic dizziness.  Patient Active Problem List   Diagnosis Date Noted   Leg swelling 01/13/2019   Vertigo 12/31/2017   Bleeding into the skin 10/22/2017   Acute diastolic CHF (congestive heart failure) (HCC) 07/03/2017   Tremor 07/03/2017   Near syncope 07/03/2017   Vasovagal syncope    Hyperlipidemia with target LDL less than 70    SSS (sick sinus syndrome) (HCC) 07/31/2016   CAD S/P DES PCI to RCA 07/31/2016   Anemia 07/31/2016   Essential hypertension 07/13/2016   Hypothyroidism 07/13/2016   Coronary artery disease involving native heart without angina pectoris 12/02/2014   Mitral  regurgitation and aortic stenosis 09/30/2014   Thoracic aortic atherosclerosis (HCC) 09/18/2014   Chronic diastolic heart failure due to valvular disease (HCC) 09/18/2013   Paroxysmal atrial fibrillation (HCC): CHA2DS2Vasc = 6; [facility] 09/18/2012    Past Surgical History:  Procedure Laterality Date   ABDOMINAL HYSTERECTOMY  2001   Cataract Surgery  Bilateral    COLONOSCOPY W/ BIOPSIES  2006   2 polyps noted along with diverticulitis   CORONARY ANGIOPLASTY WITH STENT PLACEMENT  11/2014   [facility]: 90% RCA - PCI with Promus DES.   implanted cardiac monitor      LOOP RECORDER INSERTION  07/24/2013   Medtronic Linq - to evaluate SSS for symptomatic bradycardia   Right and Left Heart Catheterization  11/2014   RHC: RAP 4, RVP 32/4, PA peak 29/7/16. LVEDP 18.  CORS: 30% LM, 50% LAD, 90% RCA --> PCI (Promus DES)   TEE WITHOUT CARDIOVERSION  09/2014   Normal LV function. Severe MR with multiple jets. Vena contractile 0.8.  PFO with L-R shunt; moderate grade 3-4 aortic atheroma   TONSILLECTOMY     TRANSTHORACIC ECHOCARDIOGRAM  07/2017   EF 60 to 65%.  Moderate-severe MR.  Moderate TR with elevated RVSP   TRANSTHORACIC ECHOCARDIOGRAM  08/2014   Normal LV function with biatrial enlargement. Moderate-severe mitral and tricuspid insufficiency.     OB History   No obstetric history on file.      Home Medications    Prior to Admission medications   Medication Sig Start Date End Date Taking? Authorizing Provider  Acetaminophen (TYLENOL PO) Take 650 mg by mouth as directed. daily    [provider]  aspirin EC 81 MG tablet Take 81 mg by mouth daily.    [provider]  Carboxymethylcellul-Glycerin (REFRESH OPTIVE OP) Place 1 drop into both eyes daily as needed (dry eyes).    [provider]  ferrous sulfate 325 (65 FE) MG tablet Take 325 mg by mouth 2 (two) times daily with a meal.     [provider]  fluticasone (FLONASE) 50 MCG/ACT nasal spray Place 2 sprays into both nostrils daily as needed for allergies or rhinitis.     [provider]  furosemide (LASIX) 20 MG tablet TAKE 1 TABLET (20 MG)  BY MOUTH EVERY OTHER DAY AND MAY TAKE AN ADDITIONAL 20 MG ON THE ODD DAYS IF NEEDED. 01/13/19   Marykay LexHarding, David W, MD  levothyroxine (SYNTHROID) 75 MCG tablet TAKE 1 TABLET BY MOUTH ONCE A DAY BEFORE BREAKFAST 05/16/19   Nafziger, Kandee Keenory, NP  meclizine (ANTIVERT) 25 MG tablet Take 0.5-1 tablets (12.5-25 mg total) by mouth 3 (three) times daily as needed for dizziness. 09/10/18   Nafziger, Kandee Keenory, NP  meloxicam (MOBIC) 7.5 MG tablet Take 1 tablet (7.5 mg total) by mouth daily. Patient not taking: Reported on 01/13/2019 08/07/17  Nafziger, Tommi Rumps, NP  omeprazole (PRILOSEC) 20 MG capsule Take 1 capsule (20 mg total) by mouth daily. 05/16/19   Nafziger, Tommi Rumps, NP  polyethylene glycol (MIRALAX / GLYCOLAX) packet Take 8.5 g by mouth See admin instructions. Mix 1/2 scoop (8.5 g) in 8 oz orange juice and drink daily    [provider]  potassium chloride (K-DUR) 10 MEQ tablet Take 1 tablet (10 mEq total) by mouth 2 (two) times daily. 05/16/19   Nafziger, Tommi Rumps, NP    Family History Family History  Problem Relation Age of Onset   Stomach cancer Father    Cancer Brother    Diabetes Brother    Cancer Daughter     Social History Social History   Tobacco Use   Smoking status: Former Smoker    Packs/day: 0.50    Years: 5.00    Pack years: 2.50    Types: Cigarettes    Quit date: 08/02/1970    Years since quitting: 48.9   Smokeless tobacco: Never Used  Substance Use Topics   Alcohol use: Yes    Comment: "wine occasionally"   Drug use: No     Allergies   Carvedilol, Diltiazem, and Lopressor [metoprolol]   Review of Systems Review of Systems  Constitutional: Positive for diaphoresis. Negative for fever.  Respiratory: Negative for cough and shortness of breath.     Cardiovascular: Negative for chest pain.  Gastrointestinal: Negative for abdominal pain, nausea and vomiting.  Genitourinary: Negative for dysuria.  Neurological: Positive for syncope and speech difficulty.  All other systems reviewed and are negative.    Physical Exam Updated Vital Signs BP 115/69    Pulse 67    Temp 98.1 F (36.7 C) (Oral)    Resp 19    LMP  (LMP Unknown)    SpO2 96%   Physical Exam Vitals signs and nursing note reviewed.  Constitutional:      Appearance: She is well-developed.     Comments: Elderly, frail, no acute distress  HENT:     Head: Normocephalic and atraumatic.  Eyes:     Pupils: Pupils are equal, round, and reactive to light.     Comments: Pupils 1 to 2 mm minimally reactive  Neck:     Musculoskeletal: Neck supple.  Cardiovascular:     Rate and Rhythm: Normal rate and regular rhythm.     Heart sounds: Normal heart sounds.  Pulmonary:     Effort: Pulmonary effort is normal. No respiratory distress.     Breath sounds: No wheezing.  Abdominal:     General: Bowel sounds are normal.     Palpations: Abdomen is soft.     Tenderness: There is no abdominal tenderness.  Skin:    General: Skin is warm and dry.  Neurological:     Mental Status: She is alert.     Comments: Oriented to person and place but not time, cranial nerves II through XII intact, patient with stuttering slow pattern of speech, no dysarthria or aphasia noted, patient can name and repeat, 5 out of 5 strength in all 4 extremities, no drift, no dysmetria  Psychiatric:        Mood and Affect: Mood normal.      ED Treatments / Results  Labs (all labs ordered are listed, but only abnormal results are displayed) Labs Reviewed  CBC WITH DIFFERENTIAL/PLATELET - Abnormal; Notable for the following components:      Result Value   WBC 11.5 (*)    Hemoglobin 11.9 (*)  Neutro Abs 10.2 (*)    All other components within normal limits  URINALYSIS, ROUTINE W REFLEX MICROSCOPIC -  Abnormal; Notable for the following components:   APPearance HAZY (*)    Hgb urine dipstick SMALL (*)    Leukocytes,Ua MODERATE (*)    Bacteria, UA RARE (*)    All other components within normal limits  COMPREHENSIVE METABOLIC PANEL - Abnormal; Notable for the following components:   Sodium 131 (*)    Glucose, Bld 136 (*)    Calcium 8.8 (*)    Total Protein 5.7 (*)    Albumin 3.3 (*)    AST 14 (*)    GFR calc non Af Amer 54 (*)    All other components within normal limits  I-STAT CHEM 8, ED - Abnormal; Notable for the following components:   Sodium 130 (*)    Glucose, Bld 121 (*)    Calcium, Ion 1.06 (*)    All other components within normal limits  ETHANOL  PROTIME-INR  APTT  RAPID URINE DRUG SCREEN, HOSP PERFORMED  TROPONIN I (HIGH SENSITIVITY)  TROPONIN I (HIGH SENSITIVITY)    EKG None   ED ECG REPORT   Date: 07/02/2019  Rate: 69  Rhythm: normal sinus rhythm  QRS Axis: normal  Intervals: normal  ST/T Wave abnormalities: normal  Conduction Disutrbances:none  Narrative Interpretation:   Old EKG Reviewed: none available  I have personally reviewed the EKG tracing and agree with the computerized printout as noted.  Radiology Ct Head Wo Contrast  Result Date: 07/02/2019 CLINICAL DATA:  Near syncopal episode EXAM: CT HEAD WITHOUT CONTRAST TECHNIQUE: Contiguous axial images were obtained from the base of the skull through the vertex without intravenous contrast. COMPARISON:  CT brain 12/31/2017 FINDINGS: Brain: No acute territorial infarction, hemorrhage or intracranial mass. Atrophy and small vessel ischemic changes of the white matter. Stable ventricle size. Vascular: No hyperdense vessels.  Carotid vascular calcification Skull: Heterogenous attenuation of the calvarium. No fracture. Possible arachnoid granulation in the posterior fossa. This is unchanged Sinuses/Orbits: No acute finding. Other: None IMPRESSION: 1. No CT evidence for acute intracranial abnormality.  Atrophy and small vessel ischemic changes of the white matter 2. Heterogenous attenuation of the calvarium, possibly due to osteopenia or marrow infiltrative process. Electronically Signed   By: Jasmine Pang M.D.   On: 07/02/2019 04:29    Procedures Procedures (including critical care time)  Medications Ordered in ED Medications - No data to display   Initial Impression / Assessment and Plan / ED Course  I have reviewed the triage vital signs and the nursing notes.  Pertinent labs & imaging results that were available during my care of the patient were reviewed by me and considered in my medical decision making (see chart for details).        Patient presents with an episode of altered mental status versus syncope/near syncope.  She is back to her baseline with the exception of some slowed speech.  She recognizes this as well as her husband.  There is no evidence of dysarthria or aphasia.  Vital signs are reassuring.  Her EKG shows no evidence of arrhythmia or ischemia.  Troponin negative.  Patient is not orthostatic.  On neurologic testing she is only oriented x2 but patient's husband states that this is at her baseline.  UDS and EtOH are negative.  He has a slight leukocytosis and slight hyponatremia.  She may have some dehydration.  No evidence of urinary tract infection.  CT of the  head is negative.  Will obtain an MRI to rule out stroke.  If this is negative, feel that she can be discharged home.  On recheck, she is 100% back to baseline.  Patient signed out to oncoming provider.  Final Clinical Impressions(s) / ED Diagnoses   Final diagnoses:  None    ED Discharge Orders    None       Seanmichael Salmons, Mayer Masker, MD 07/02/19 [phone]

## 2019-07-18 ENCOUNTER — Other Ambulatory Visit: Payer: Self-pay

## 2019-07-18 ENCOUNTER — Encounter: Payer: Self-pay | Admitting: Adult Health

## 2019-07-18 ENCOUNTER — Ambulatory Visit (INDEPENDENT_AMBULATORY_CARE_PROVIDER_SITE_OTHER): Payer: Medicare HMO

## 2019-07-18 DIAGNOSIS — Z23 Encounter for immunization: Secondary | ICD-10-CM | POA: Diagnosis not present

## 2019-07-22 ENCOUNTER — Telehealth: Payer: Self-pay | Admitting: *Deleted

## 2019-07-22 ENCOUNTER — Encounter: Payer: Self-pay | Admitting: Cardiology

## 2019-07-22 ENCOUNTER — Telehealth (INDEPENDENT_AMBULATORY_CARE_PROVIDER_SITE_OTHER): Payer: Medicare HMO | Admitting: Cardiology

## 2019-07-22 VITALS — BP 117/79 | HR 69 | Ht 65.0 in | Wt 120.0 lb

## 2019-07-22 DIAGNOSIS — Z9861 Coronary angioplasty status: Secondary | ICD-10-CM

## 2019-07-22 DIAGNOSIS — I080 Rheumatic disorders of both mitral and aortic valves: Secondary | ICD-10-CM | POA: Diagnosis not present

## 2019-07-22 DIAGNOSIS — I480 Paroxysmal atrial fibrillation: Secondary | ICD-10-CM | POA: Diagnosis not present

## 2019-07-22 DIAGNOSIS — I495 Sick sinus syndrome: Secondary | ICD-10-CM

## 2019-07-22 DIAGNOSIS — I2510 Atherosclerotic heart disease of native coronary artery without angina pectoris: Secondary | ICD-10-CM

## 2019-07-22 DIAGNOSIS — R55 Syncope and collapse: Secondary | ICD-10-CM | POA: Diagnosis not present

## 2019-07-22 NOTE — Telephone Encounter (Signed)
SPOKE TO PATIENT'S HUSBAND. INSTRUCTION WAS GIVEN FROM TODAY'S VIRTUAL VISIT.  AVS SUMMARY SENT VIA MYCHART . APPOINTMENT SCHEDULE FOR JAN 7 , 2021 AT 9:20 - VIRTUAL.   PATIENT'S HUSBAND -ART VERBALIZED UNDERSTANDING.

## 2019-07-22 NOTE — Telephone Encounter (Signed)
Informed husband -it will be okay to do virtual visit with  patient  Today  instruction given on how visit will proceed.

## 2019-07-22 NOTE — Patient Instructions (Addendum)
  Medication Instructions:  none    Lab Work:  n/a   Testing/Procedures: WILL BE SCHEDULE AT [facility] 30 day Event Monitor - Your physician has recommended that you wear an event monitor. Event monitors are medical devices that record the heart's electrical activity. Doctors most often [place] these monitors to diagnose arrhythmias. Arrhythmias are problems with the speed or rhythm of the heartbeat. The monitor is a small, portable device. You can wear one while you do your normal daily activities. This is usually used to diagnose what is causing palpitations/syncope (passing out).   Follow-Up: At [facility], you and your health needs are our priority.  As part of our continuing mission to provide you with exceptional heart care, we have created designated Provider Care Teams.  These Care Teams include your primary Cardiologist (physician) and Advanced Practice Providers (APPs -  Physician Assistants and Nurse Practitioners) who all work together to provide you with the care you need, when you need it.  Your next appointment:   6-8 weeks (virtual)  The format for your next appointment:   Virtual Visit   Provider:   Glenetta Hew, MD  Other Instructions Continue to wear support stockings  & continue to drink plenty of water.

## 2019-07-22 NOTE — Progress Notes (Signed)
Virtual Visit via Video Note   This visit type was conducted due to national recommendations for restrictions regarding the COVID-19 Pandemic (e.g. social distancing) in an effort to limit this patient's exposure and mitigate transmission in our community.  Due to her co-morbid illnesses, this patient is at least at moderate risk for complications without adequate follow up.  This format is felt to be most appropriate for this patient at this time.  All issues noted in this document were discussed and addressed.  A limited physical exam was performed with this format.  Please refer to the patient's chart for her consent to telehealth for [facility].   Patient has given verbal permission to conduct this visit via virtual appointment and to bill insurance 07/24/2019 10:48 PM     Evaluation Performed:  Follow-up visit  Date:  07/24/2019   ID:  Deborah [name], DOB December 02, 1930, MRN [id number]  Patient Location: Home Provider Location: Office  PCP:  Shirline FreesNafziger, [name], NP  Cardiologist:  Bryan Lemmaavid Tomica Arseneault, MD  Electrophysiologist:  None   Chief Complaint:   Chief Complaint  Patient presents with   Hospitalization Follow-up    near syncope   Coronary Artery Disease   Bradycardia    A Fib    History of Present Illness:    Deborah [name] is a 85 y.o. female with PMH notable for PAF with tachybradycardia, CAD and severe MR along with HFpEF who presents via audio/video conferencing for a telehealth visit today as an emergency room follow-up  Deborah ApaRose Mcray was last seen by me via telehealth medicine back in April 2020.  Was doing well.  Maybe a little bit of dizziness but manageable.  [place]  Swelling well controlled with every other day Lasix and support stockings.  Hospitalizations:   ER visit July 02, 2019-near syncope-was pale and diaphoretic and minimally responsive short after going to the bathroom.  She then had near syncope or blacked out several times  while trying to get her back into bed.  Noted some slow speech and lightheadedness.  Felt okay by time she reached the ER, just did not feel quite right.  Felt to be somewhat dehydrated due to mild hyponatremia.  CT head negative.  UA normal.  MRI of the brain performed. o MRI brain: Atrophy and extensive small vessel ischemia noted but stable from 2019.  No acute findings.   Prior CV studies:   The following studies were reviewed today:  No new studies:  Inerval History   Deborah ApaRose Warrior is being seen today via telehealth medicine for follow-up.  Most of the story is provided by her husband-Deborah [name].  She as usual seems to be a little bit slow on answering respond to questions.  Part of this is because she was not able to hear her very well through the telemedicine system.  Her husband indicates that she is still just a little bit "off "not quite back to her baseline since her episode went to the emergency room.  So, as the story goes on the evening of October 14, she had a relatively normal day with no major issues.  No unusual symptoms leading up to this, but apparently she was going to the bathroom prior to getting ready for bed and never came out.  Her husband went in to find her after maybe over half an hour or so.  When he saw her he noted that she was pale and diaphoretic and just not responding normally.  She nearly lost consciousness on several occasions while getting her to the bed.  And her speech seemed to slow as did her responsiveness.  She herself does not recall any of this episode.  She has some vague recollections of things that happened in the ER such as going to the MRI, but does not remember going to the bathroom.  She denies any symptoms whatsoever of chest pain or pressure.  No rapid irregular heartbeat sensations or palpitations.  It is unclear whether she truly lost consciousness or not.  Deborah [name] called 911 when he was not able to get him to respond after 1 of these near passout  spells. In the emergency room, she was noted to be alert and oriented x2 with stuttering and slow speech.  Apparently this is premature baseline though.  She had pretty much normal strength and concussion recall.  Did have slow stuttering speech.  The differential diagnosis was also mental status versus syncope/near syncope.  Talk screen was normal.  Slight leukocytosis and slight hyponatremia thought may be that this could be related to dehydration.  No evidence of UTI and CT/MRI were normal.  Was felt to be almost 100% back to baseline and was discharged from the ER.  Interestingly, since her discharge from the hospital emergency room, she just really has not been back to herself.  Energy levels not really getting back to normal.  She has been sleeping more than usual.   Cardiovascular ROS: no chest pain or dyspnea on exertion positive for - sear syncope as noted (on toilet- large loose BM). edema usually controlled with support stockings negative for - irregular heartbeat, orthopnea, palpitations, paroxysmal nocturnal dyspnea, rapid heart rate, shortness of breath or ? no true LOC.     ROS:  Please see the history of present illness.    The patient does not have symptoms concerning for COVID-19 infection (fever, chills, cough, or new shortness of breath).  Review of Systems  Constitutional: Positive for weight loss (Appetite has gone down). Negative for malaise/fatigue.  HENT: Negative for nosebleeds.   Respiratory: Negative for shortness of breath.   Cardiovascular: Negative for palpitations.  Gastrointestinal: Negative for abdominal pain, blood in stool, heartburn and melena.  Genitourinary: Negative for hematuria.  Musculoskeletal: Positive for joint pain. Negative for falls (Did not actually fall).  Neurological: Positive for dizziness (Stable chronic), speech change (Almost back to normal now) and weakness (Generalized). Negative for tingling, sensory change, focal weakness,  seizures, loss of consciousness (No apparent loss of consciousness.) and headaches.       No loss of bowel or bladder.  During her prolonged spell, no signs or symptoms of seizures.  Psychiatric/Behavioral: Positive for memory loss. The patient is nervous/anxious (She seems a little anxious, but more because of being a bit confused).     The patient is practicing social distancing.  Past Medical History:  Diagnosis Date   Anxiety    Bradycardia    CAD S/P percutaneous coronary angioplasty 11/2014   DES PCI to RCA. 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 Normal EF by echo.   Diverticulitis    Essential hypertension    H/O: upper GI bleed 12/03/2014   While on ASA/Plavix & Warfarin --> Hct dropped to 18%; small AVM noted but no overt pathology.   History of uterine cancer 2001  Status post hysterectomy   Hyperlipidemia with target LDL less than 70    Coronary disease and thoracic aortic atheroma   Hypothyroidism    On Levothyroxine   Paroxysmal atrial fibrillation ([place]) 2014   Associated with sick sinus syndrome. -- Intolerant of beta blockers and diltiazem. Rate control with digoxin. Not on anticoagulation despite CHA2DS2Vasc 6  2/2 prior severe GI bleed on triple therapy   PFO (patent foramen ovale)    Noted on TEE - L-R shunt (elsewhere reported it as a ASD)   Severe mitral regurgitation by prior echocardiogram 09/2014   Seen on TEE to have severe MR with multiple jets. Vena contracted 0.8; Consideration had been ? Mitral E-Clip - put on hold after GI Bleed.   Sick sinus syndrome [facility])    Status post Loop Recorder: [employer] Linq -- most recent interrogation 05/19/2016: 2 new pauses. One greater than 3 seconds at 0 320 the morning. Second was 4.4 seconds at 9:56 PM; also noted to have an episode of A.  fib. --> Recommended further follow-up upon arrival to [facility].   Thoracic aortic atherosclerosis ([place]) 09/2014   Moderate grade 3-4 atheroma noted on TEE   Urinary tract infection    Vasovagal syncope    Exacerbated by bradycardia, sick sinus syndrome, and mitral regurgitation.   Vertigo, central, unspecified laterality    Chronic dizziness.   Past Surgical History:  Procedure Laterality Date   ABDOMINAL HYSTERECTOMY  2001   Cataract Surgery  Bilateral    COLONOSCOPY W/ BIOPSIES  2006   2 polyps noted along with diverticulitis   CORONARY ANGIOPLASTY WITH STENT PLACEMENT  11/2014   [place]: 90% RCA - PCI with Promus DES.   implanted cardiac monitor      LOOP RECORDER INSERTION  07/24/2013   Medtronic Linq - to evaluate SSS for symptomatic bradycardia   Right and Left Heart Catheterization  11/2014   RHC: RAP 4, RVP 32/4, PA peak 29/7/16. LVEDP 18.  [place]: 30% LM, 50% LAD, 90% RCA --> PCI (Promus DES)   TEE WITHOUT CARDIOVERSION  09/2014   Normal LV function. Severe MR with multiple jets. Vena contractile 0.8.  PFO with L-R shunt; moderate grade 3-4 aortic atheroma   TONSILLECTOMY     TRANSTHORACIC ECHOCARDIOGRAM  07/2017   EF 60 to 65%.  Moderate-severe MR.  Moderate TR with elevated RVSP   TRANSTHORACIC ECHOCARDIOGRAM  08/2014   Normal LV function with biatrial enlargement. Moderate-severe mitral and tricuspid insufficiency.     Current Meds  Medication Sig   Acetaminophen (TYLENOL PO) Take 650 mg by mouth as needed. daily    aspirin EC 81 MG tablet Take 81 mg by mouth daily.   Carboxymethylcellul-Glycerin (REFRESH OPTIVE OP) Place 1 drop into both eyes daily as needed (dry eyes).   ferrous sulfate 325 (65 FE) MG tablet Take 325 mg by mouth 2 (two) times daily with a meal.    fluticasone (FLONASE) 50 MCG/ACT nasal spray Place 2 sprays into both nostrils daily as needed for allergies or rhinitis.    furosemide  (LASIX) 20 MG tablet TAKE 1 TABLET (20 MG)  BY MOUTH EVERY OTHER DAY AND MAY TAKE AN ADDITIONAL 20 MG ON THE ODD DAYS IF NEEDED.   levothyroxine (SYNTHROID) 75 MCG tablet TAKE 1 TABLET BY MOUTH ONCE A DAY BEFORE BREAKFAST   meclizine (ANTIVERT) 25 MG tablet Take 0.5-1 tablets (12.5-25 mg total) by mouth 3 (three) times daily as needed for dizziness.  meloxicam (MOBIC) 7.5 MG tablet Take 1 tablet (7.5 mg total) by mouth daily. (Patient taking differently: Take 7.5 mg by mouth as needed. )   omeprazole (PRILOSEC) 20 MG capsule Take 1 capsule (20 mg total) by mouth daily.   polyethylene glycol (MIRALAX / GLYCOLAX) packet Take 8.5 g by mouth See admin instructions. Mix 1/2 scoop (8.5 g) in 8 oz orange juice and drink daily   potassium chloride (K-DUR) 10 MEQ tablet Take 1 tablet (10 mEq total) by mouth 2 (two) times daily.     Allergies:   Carvedilol, Diltiazem, and Lopressor [metoprolol]   Social History   Tobacco Use   Smoking status: Former Smoker    Packs/day: 0.50    Years: 5.00    Pack years: 2.50    Types: Cigarettes    Quit date: 08/02/1970    Years since quitting: 49.0   Smokeless tobacco: Never Used  Substance Use Topics   Alcohol use: Yes    Comment: "wine occasionally"   Drug use: No     Family Hx: The patient's family history includes Cancer in her brother and daughter; Diabetes in her brother; Stomach cancer in her father.   Labs/Other Tests and Data Reviewed:    EKG:  No ECG reviewed.  Recent Labs: 07/02/2019: ALT 13; BUN 17; Creatinine, Ser 0.90; Hemoglobin 12.9; Platelets 331; Potassium 4.2; Sodium 130   Recent Lipid Panel- n/a  Wt Readings from Last 3 Encounters:  07/22/19 120 lb (54.4 kg)  01/13/19 125 lb (56.7 kg)  07/10/18 125 lb 4 oz (56.8 kg)     Objective:    Vital Signs:  BP 117/79    Pulse 69    Ht 5\' 5"  (1.651 m)    Wt 120 lb (54.4 kg)    LMP  (LMP Unknown)    BMI 19.97 kg/m   VITAL SIGNS:  reviewed GEN:  Somewhat thin, frail  elderly woman.  Quite hard of hearing.  No obvious distress, but does not quite seem to be comprehending well. RESPIRATORY:  Nonlabored, normal respiratory motion.  No distress. NEURO:  Alert oriented x2 which is her baseline.  Slow response answers.  Could be because of poor hearing PSYCH:  normal affect, a bit confused  ASSESSMENT & PLAN:    Problem List Items Addressed This Visit    Coronary artery disease involving native heart without angina pectoris - Primary (Chronic)    History of PCI to the RCA back in 2016.  She is not really having any signs or symptoms of angina or ischemic symptoms.  Pretty much because rehabilitation we have taken off most of her medications besides as needed Lasix.  Especially in light of her recent episode, I am reluctant to restart anything like beta-blocker, calcium Channel blocker or ARB/ACE inhibitor.  No longer on Plavix, is on aspirin.      Mitral regurgitation and aortic stenosis (Chronic)    Me that her mitral regurgitation or aortic stenosis for significant enough to potentially cause symptoms.      SSS (sick sinus syndrome) (HCC) (Chronic)    I suspect her loop recorder is unlikely to be still functioning.  Her episode of confusion near syncope does not sound like it would be related to bradycardia however cannot be sure.  She has no longer on any AV nodal agents.  We will discuss with her whether or not she would want to do a monitor for 30 days.      Relevant Orders   CARDIAC  EVENT MONITOR   CAD S/P DES PCI to RCA (Chronic)   Paroxysmal atrial fibrillation (HCC): CHA2DS2Vasc = 6; Not currently on AC. (Chronic)    Hard to tell she was having A. fib or not during the spell.  Did not seem to be in A. fib when she went to emergency room.  No signs symptoms to suggest palpitations.  She has a very high CHA2DS2-VASc score, however due to her comorbidities and falls/unsteady gait, we have chosen to avoid anticoagulation.  Using aspirin 81 mg  for stroke precaution.  Avoiding any AV nodal agents because of her sick sinus syndrome.      Relevant Orders   CARDIAC EVENT MONITOR   Vasovagal syncope (Chronic)    She has had episodes in the past of vasovagal syncope exacerbated bradycardia/sick sinus syndrome.  It is for this reason that I wonder if maybe she had a combination of symptoms that led her to have a vasovagal event that led to her altered mental status by hypotension driven neurologic ischemia.      Relevant Orders   CARDIAC EVENT MONITOR   Near syncope    Difficult scenario to understand.  My suspicion is, that there is a combination of several things.  I think she may very well have been a little bit dehydrated as indicated by her labs, but not grossly abnormal with no acute renal failure. She has had a history of sick sinus syndrome and bradycardia.    Is quite likely that she had some semblance of micturition or cathartic reflex related vasovagal syncope or near syncope.  She probably then developed bradycardia as result of this and potentially became hypotensive.  This could have led to her minimal responsiveness and fatigue.  The concerning point is that she had prolonged spell of not feeling well.  This is not consistent with syncope nor is it consistent with a significant arrhythmia since she was no longer in any arrhythmia when she went to the ER and no report by EMS.  It is possible that although there was no evidence of stroke she could have had a TIA associated with drop in blood pressure as mentioned.  That would explain her prolonged symptoms and then recovery.  Plan: We will order 30-day cardiac event monitor to evaluate for any arrhythmias either bradycardic or tachycardic.  If symptoms recur, low threshold to consider either loop recorder and/or echocardiogram at a minimum.         COVID-19 Education: The signs and symptoms of COVID-19 were discussed with the patient and how to seek care for testing  (follow up with PCP or arrange E-visit).   The importance of social distancing was discussed today.  Time:   Today, I have spent 24 minutes with the patient with telehealth technology discussing the above problems.     Medication Adjustments/Labs and Tests Ordered: Current medicines are reviewed at length with the patient today.  Concerns regarding medicines are outlined above.   Patient Instructions   Medication Instructions:  none    Lab Work:  n/a   Testing/Procedures: WILL BE SCHEDULE AT [address] 30 day Event Monitor - Your physician has recommended that you wear an event monitor. Event monitors are medical devices that record the hearts electrical activity. Doctors most often [place] these monitors to diagnose arrhythmias. Arrhythmias are problems with the speed or rhythm of the heartbeat. The monitor is a small, portable device. You can wear one while you do your normal daily  activities. This is usually used to diagnose what is causing palpitations/syncope (passing out).   Follow-Up: At [facility], you and your health needs are our priority.  As part of our continuing mission to provide you with exceptional heart care, we have created designated Provider Care Teams.  These Care Teams include your primary Cardiologist (physician) and Advanced Practice Providers (APPs -  Physician Assistants and Nurse Practitioners) who all work together to provide you with the care you need, when you need it.  Your next appointment:   6-8 weeks (virtual)  The format for your next appointment:   Virtual Visit   Provider:   Bryan Lemma, MD  Other Instructions Continue to wear support stockings  & continue to drink plenty of water.     Signed, Bryan Lemma, MD  07/24/2019 10:48 PM    [facility]

## 2019-07-22 NOTE — Assessment & Plan Note (Signed)
I suspect her loop recorder is unlikely to be still functioning.  Her episode of confusion near syncope does not sound like it would be related to bradycardia however cannot be sure.  She has no longer on any AV nodal agents.  We will discuss with her whether or not she would want to do a monitor for 30 days.

## 2019-07-24 ENCOUNTER — Encounter: Payer: Self-pay | Admitting: Cardiology

## 2019-07-24 NOTE — Assessment & Plan Note (Signed)
Hard to tell she was having A. fib or not during the spell.  Did not seem to be in A. fib when she went to emergency room.  No signs symptoms to suggest palpitations.  She has a very high CHA2DS2-VASc score, however due to her comorbidities and falls/unsteady gait, we have chosen to avoid anticoagulation.  Using aspirin 81 mg for stroke precaution.  Avoiding any AV nodal agents because of her sick sinus syndrome.

## 2019-07-24 NOTE — Assessment & Plan Note (Signed)
History of PCI to the RCA back in 2016.  She is not really having any signs or symptoms of angina or ischemic symptoms.  Pretty much because rehabilitation we have taken off most of her medications besides as needed Lasix.  Especially in light of her recent episode, I am reluctant to restart anything like beta-blocker, calcium Channel blocker or ARB/ACE inhibitor.  No longer on Plavix, is on aspirin.

## 2019-07-24 NOTE — Assessment & Plan Note (Signed)
She has had episodes in the past of vasovagal syncope exacerbated bradycardia/sick sinus syndrome.  It is for this reason that I wonder if maybe she had a combination of symptoms that led her to have a vasovagal event that led to her altered mental status by hypotension driven neurologic ischemia.

## 2019-07-24 NOTE — Assessment & Plan Note (Signed)
Me that her mitral regurgitation or aortic stenosis for significant enough to potentially cause symptoms.

## 2019-07-24 NOTE — Assessment & Plan Note (Signed)
Difficult scenario to understand.  My suspicion is, that there is a combination of several things.  I think she may very well have been a little bit dehydrated as indicated by her labs, but not grossly abnormal with no acute renal failure. She has had a history of sick sinus syndrome and bradycardia.    Is quite likely that she had some semblance of micturition or cathartic reflex related vasovagal syncope or near syncope.  She probably then developed bradycardia as result of this and potentially became hypotensive.  This could have led to her minimal responsiveness and fatigue.  The concerning point is that she had prolonged spell of not feeling well.  This is not consistent with syncope nor is it consistent with a significant arrhythmia since she was no longer in any arrhythmia when she went to the ER and no report by EMS.  It is possible that although there was no evidence of stroke she could have had a TIA associated with drop in blood pressure as mentioned.  That would explain her prolonged symptoms and then recovery.  Plan: We will order 30-day cardiac event monitor to evaluate for any arrhythmias either bradycardic or tachycardic.  If symptoms recur, low threshold to consider either loop recorder and/or echocardiogram at a minimum.

## 2019-07-28 ENCOUNTER — Encounter: Payer: Self-pay | Admitting: Adult Health

## 2019-07-31 ENCOUNTER — Encounter: Payer: Self-pay | Admitting: Adult Health

## 2019-07-31 ENCOUNTER — Other Ambulatory Visit: Payer: Self-pay | Admitting: Family Medicine

## 2019-07-31 MED ORDER — LEVOTHYROXINE SODIUM 75 MCG PO TABS: ORAL_TABLET | ORAL | 0 refills | Status: DC

## 2019-07-31 MED ORDER — OMEPRAZOLE 20 MG PO CPDR: 20.0000 mg | DELAYED_RELEASE_CAPSULE | Freq: Every day | ORAL | 0 refills | Status: DC

## 2019-07-31 MED ORDER — POTASSIUM CHLORIDE CRYS ER 10 MEQ PO TBCR: 10.0000 meq | EXTENDED_RELEASE_TABLET | Freq: Two times a day (BID) | ORAL | 0 refills | Status: DC

## 2019-07-31 NOTE — Telephone Encounter (Signed)
Sent to the pharmacy by e-scribe. 

## 2019-08-01 ENCOUNTER — Other Ambulatory Visit: Payer: Self-pay | Admitting: Adult Health

## 2019-08-01 MED ORDER — ALPRAZOLAM 0.25 MG PO TABS: 0.2500 mg | ORAL_TABLET | Freq: Every evening | ORAL | 0 refills | Status: DC | PRN

## 2019-08-05 ENCOUNTER — Ambulatory Visit (INDEPENDENT_AMBULATORY_CARE_PROVIDER_SITE_OTHER): Payer: Medicare HMO

## 2019-08-05 DIAGNOSIS — I480 Paroxysmal atrial fibrillation: Secondary | ICD-10-CM

## 2019-08-05 DIAGNOSIS — I495 Sick sinus syndrome: Secondary | ICD-10-CM

## 2019-08-05 DIAGNOSIS — R55 Syncope and collapse: Secondary | ICD-10-CM

## 2019-08-08 ENCOUNTER — Telehealth: Payer: Self-pay | Admitting: Cardiology

## 2019-08-08 NOTE — Telephone Encounter (Signed)
[employer] called on-call physician to notify Dr. Ellyn Hack that patient had first documented episode of atrial fibrillation with HR 100 on 30-day monitor this evening. Per notes, patient has a known history of pAF. Felt fatigued, otherwise asymptomatic. Will notify Dr. Ellyn Hack.   Dinorah Masullo K. Marletta Lor, MD

## 2019-09-03 ENCOUNTER — Encounter: Payer: Self-pay | Admitting: Adult Health

## 2019-09-03 ENCOUNTER — Encounter: Payer: Self-pay | Admitting: Cardiology

## 2019-09-09 ENCOUNTER — Other Ambulatory Visit: Payer: Self-pay

## 2019-09-10 ENCOUNTER — Ambulatory Visit (INDEPENDENT_AMBULATORY_CARE_PROVIDER_SITE_OTHER): Payer: Medicare HMO | Admitting: Adult Health

## 2019-09-10 ENCOUNTER — Encounter: Payer: Self-pay | Admitting: Adult Health

## 2019-09-10 VITALS — BP 150/60 | Temp 97.2°F | Ht 59.0 in | Wt 113.0 lb

## 2019-09-10 DIAGNOSIS — Z0000 Encounter for general adult medical examination without abnormal findings: Secondary | ICD-10-CM

## 2019-09-10 DIAGNOSIS — R630 Anorexia: Secondary | ICD-10-CM | POA: Diagnosis not present

## 2019-09-10 DIAGNOSIS — H6123 Impacted cerumen, bilateral: Secondary | ICD-10-CM | POA: Diagnosis not present

## 2019-09-10 DIAGNOSIS — I2510 Atherosclerotic heart disease of native coronary artery without angina pectoris: Secondary | ICD-10-CM

## 2019-09-10 DIAGNOSIS — I10 Essential (primary) hypertension: Secondary | ICD-10-CM

## 2019-09-10 DIAGNOSIS — E039 Hypothyroidism, unspecified: Secondary | ICD-10-CM | POA: Diagnosis not present

## 2019-09-10 DIAGNOSIS — I480 Paroxysmal atrial fibrillation: Secondary | ICD-10-CM

## 2019-09-10 LAB — CBC WITH DIFFERENTIAL/PLATELET
Basophils Absolute: 0.1 10*3/uL (ref 0.0–0.1)
Basophils Relative: 0.8 % (ref 0.0–3.0)
Eosinophils Absolute: 0.2 10*3/uL (ref 0.0–0.7)
Eosinophils Relative: 1.9 % (ref 0.0–5.0)
HCT: 37.5 % (ref 36.0–46.0)
Hemoglobin: 12.3 g/dL (ref 12.0–15.0)
Lymphocytes Relative: 11.5 % — ABNORMAL LOW (ref 12.0–46.0)
Lymphs Abs: 1 10*3/uL (ref 0.7–4.0)
MCHC: 32.7 g/dL (ref 30.0–36.0)
MCV: 92.3 fl (ref 78.0–100.0)
Monocytes Absolute: 0.7 10*3/uL (ref 0.1–1.0)
Monocytes Relative: 8.4 % (ref 3.0–12.0)
Neutro Abs: 6.6 10*3/uL (ref 1.4–7.7)
Neutrophils Relative %: 77.4 % — ABNORMAL HIGH (ref 43.0–77.0)
Platelets: 381 10*3/uL (ref 150.0–400.0)
RBC: 4.06 Mil/uL (ref 3.87–5.11)
RDW: 13.9 % (ref 11.5–15.5)
WBC: 8.6 10*3/uL (ref 4.0–10.5)

## 2019-09-10 LAB — COMPREHENSIVE METABOLIC PANEL
ALT: 11 U/L (ref 0–35)
AST: 12 U/L (ref 0–37)
Albumin: 3.9 g/dL (ref 3.5–5.2)
Alkaline Phosphatase: 79 U/L (ref 39–117)
BUN: 16 mg/dL (ref 6–23)
CO2: 27 mEq/L (ref 19–32)
Calcium: 9.5 mg/dL (ref 8.4–10.5)
Chloride: 98 mEq/L (ref 96–112)
Creatinine, Ser: 0.81 mg/dL (ref 0.40–1.20)
GFR: 66.96 mL/min (ref >60.00)
Glucose, Bld: 94 mg/dL (ref 70–99)
Potassium: 4.9 mEq/L (ref 3.5–5.1)
Sodium: 134 mEq/L — ABNORMAL LOW (ref 135–145)
Total Bilirubin: 0.6 mg/dL (ref 0.2–1.2)
Total Protein: 6.3 g/dL (ref 6.0–8.3)

## 2019-09-10 LAB — TSH: TSH: 2.51 u[IU]/mL (ref 0.35–4.50)

## 2019-09-10 NOTE — Progress Notes (Signed)
Subjective:    Patient ID: Deborah [name], female    DOB: Feb 07, 1931, 85 y.o.   MRN: [id number]  HPI Patient presents for yearly preventative medicine examination. She is a pleasant 85 year old who  has a past medical history of Anxiety, Bradycardia, CAD S/P percutaneous coronary angioplasty (11/2014), Chronic diastolic heart failure due to valvular disease (HCC) (2015), Diverticulitis, Essential hypertension, H/O: upper GI bleed (12/03/2014), History of uterine cancer (2001), Hyperlipidemia with target LDL less than 70, Hypothyroidism, Paroxysmal atrial fibrillation (HCC) (2014), PFO (patent foramen ovale), Severe mitral regurgitation by prior echocardiogram (09/2014), Sick sinus syndrome [facility]), Thoracic aortic atherosclerosis (HCC) (09/2014), Urinary tract infection, Vasovagal syncope, and Vertigo, central, unspecified laterality.   She is seen by cardiology due to history of atrial fibrillation, bradycardia, and CAD with severe MR and diastolic CHF.  Most recent echo was in November 2019.  Her echocardiogram with worsening valve lesion was noted.  She refused further evaluation.  She does have a loop recorder implanted but this is no longer functioning.  She declined DOAC therapy.  She is no longer on statin due to fatigue and overall weakness. She continues to complain of fatigue " some days are better than others"   Hypertension -controlled with Cozaar 25 mg daily and she takes Lasix as needed for lower extremity edema.  Hypothyroidism-takes Synthroid 75 mcg.  Lab Results  Component Value Date   TSH 4.31 01/10/2018   loss of appetite-her husband reports that she will eat a big breakfast but then does not eat much throughout the day.  He has noticed that she is started to lose weight. Wt Readings from Last 3 Encounters:  09/10/19 113 lb (51.3 kg)  07/22/19 120 lb (54.4 kg)  01/13/19 125 lb (56.7 kg)    All immunizations and health maintenance protocols were reviewed with the patient and  needed orders were placed. She is up to date on routine vaccinations   Appropriate screening laboratory values were ordered for the patient including screening of hyperlipidemia, renal function and hepatic function.  Medication reconciliation,  past medical history, social history, problem list and allergies were reviewed in detail with the patient  Goals were established with regard to weight loss, exercise, and  diet in compliance with medications  Wt Readings from Last 3 Encounters:  09/10/19 113 lb (51.3 kg)  07/22/19 120 lb (54.4 kg)  01/13/19 125 lb (56.7 kg)   End of life planning was discussed. She has an advanced directive and living will.    Review of Systems  Constitutional: Negative.   Respiratory: Negative.   Cardiovascular: Negative.   Gastrointestinal: Negative.   Genitourinary: Negative.   Skin: Negative.   Neurological: Positive for dizziness and light-headedness.  Psychiatric/Behavioral: The patient is nervous/anxious.   All other systems reviewed and are negative.  Past Medical History:  Diagnosis Date  . Anxiety   . Bradycardia   . CAD S/P percutaneous coronary angioplasty 11/2014   DES PCI to RCA. Cath was done for pre-operative evaluation for mitral repair; LM 30%, LAD 50%, RCA 90% (Promus DES)  . 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 Normal EF by echo.  . Diverticulitis   . Essential hypertension   . H/O: upper GI bleed 12/03/2014   While on ASA/Plavix & Warfarin --> Hct dropped to 18%; small AVM noted but no overt pathology.  [name] History of uterine cancer 2001   Status post hysterectomy  . Hyperlipidemia with  target LDL less than 70    Coronary disease and thoracic aortic atheroma  . Hypothyroidism    On Levothyroxine  . Paroxysmal atrial fibrillation (HCC) 2014   Associated with sick sinus syndrome. -- Intolerant of beta blockers and diltiazem. Rate control with digoxin. Not on  anticoagulation despite CHA2DS2Vasc 6  2/2 prior severe GI bleed on triple therapy  . PFO (patent foramen ovale)    Noted on TEE - L-R shunt (elsewhere reported it as a ASD)  . Severe mitral regurgitation by prior echocardiogram 09/2014   Seen on TEE to have severe MR with multiple jets. Vena contracted 0.8; Consideration had been ? Mitral E-Clip - put on hold after GI Bleed.  . Sick sinus syndrome (HCC)    Status post Loop Recorder: Medtronic Linq -- most recent interrogation 05/19/2016: 2 new pauses. One greater than 3 seconds at 0 320 the morning. Second was 4.4 seconds at 9:56 PM; also noted to have an episode of A. fib. --> Recommended further follow-up upon arrival to [facility].  . Thoracic aortic atherosclerosis (HCC) 09/2014   Moderate grade 3-4 atheroma noted on TEE  . Urinary tract infection   . Vasovagal syncope    Exacerbated by bradycardia, sick sinus syndrome, and mitral regurgitation.  . Vertigo, central, unspecified laterality    Chronic dizziness.    Social History   Socioeconomic History  . Marital status: Married    Spouse name: Not on file  . Number of children: 2  . Years of education: Not on file  . Highest education level: Not on file  Occupational History    Comment: Retired Passenger transport manager  Tobacco Use  . Smoking status: Former Smoker    Packs/day: 0.50    Years: 5.00    Pack years: 2.50    Types: Cigarettes    Quit date: 08/02/1970    Years since quitting: 49.1  . Smokeless tobacco: Never Used  Substance and Sexual Activity  . Alcohol use: Yes    Comment: "wine occasionally"  . Drug use: No  . Sexual activity: Not on file  Other Topics Concern  . Not on file  Social History Narrative   Married for 59 years    Has a son, daughter died of breast cancer   One grandson    Moved from [place] -( Lives there from May- October) to be closer to their son & only remaining family.   Social Determinants of Health   Financial Resource Strain:   .  Difficulty of Paying Living Expenses: Not on file  Food Insecurity:   . Worried About Programme researcher, broadcasting/film/video in the Last Year: Not on file  . Ran Out of Food in the Last Year: Not on file  Transportation Needs:   . Lack of Transportation (Medical): Not on file  . Lack of Transportation (Non-Medical): Not on file  Physical Activity:   . Days of Exercise per Week: Not on file  . Minutes of Exercise per Session: Not on file  Stress:   . Feeling of Stress : Not on file  Social Connections:   . Frequency of Communication with Friends and Family: Not on file  . Frequency of Social Gatherings with Friends and Family: Not on file  . Attends Religious Services: Not on file  . Active Member of Clubs or Organizations: Not on file  . Attends Banker Meetings: Not on file  . Marital Status: Not on file  Intimate Partner Violence:   .  Fear of Current or Ex-Partner: Not on file  . Emotionally Abused: Not on file  . Physically Abused: Not on file  . Sexually Abused: Not on file    Past Surgical History:  Procedure Laterality Date  . ABDOMINAL HYSTERECTOMY  2001  . Cataract Surgery  Bilateral   . COLONOSCOPY W/ BIOPSIES  2006   2 polyps noted along with diverticulitis  . CORONARY ANGIOPLASTY WITH STENT PLACEMENT  11/2014   [facility]: 90% RCA - PCI with Promus DES.  [name] implanted cardiac monitor     . LOOP RECORDER INSERTION  07/24/2013   Medtronic Linq - to evaluate SSS for symptomatic bradycardia  . Right and Left Heart Catheterization  11/2014   RHC: RAP 4, RVP 32/4, PA peak 29/7/16. LVEDP 18.  CORS: 30% LM, 50% LAD, 90% RCA --> PCI (Promus DES)  . TEE WITHOUT CARDIOVERSION  09/2014   Normal LV function. Severe MR with multiple jets. Vena contractile 0.8.  PFO with L-R shunt; moderate grade 3-4 aortic atheroma  . TONSILLECTOMY    . TRANSTHORACIC ECHOCARDIOGRAM  07/2017   EF 60 to 65%.  Moderate-severe MR.  Moderate TR with elevated RVSP  .  TRANSTHORACIC ECHOCARDIOGRAM  08/2014   Normal LV function with biatrial enlargement. Moderate-severe mitral and tricuspid insufficiency.    Family History  Problem Relation Age of Onset  . Stomach cancer Father   . Cancer Brother   . Diabetes Brother   . Cancer Daughter     Allergies  Allergen Reactions  . Carvedilol Other (See Comments)    Bradycardia   . Diltiazem Other (See Comments)    Bradycardia  . Lopressor [Metoprolol] Other (See Comments)    Bradycardia    Current Outpatient Medications on File Prior to Visit  Medication Sig Dispense Refill  . Acetaminophen (TYLENOL PO) Take 650 mg by mouth as needed. daily     . ALPRAZolam (XANAX) 0.25 MG tablet Take 1 tablet (0.25 mg total) by mouth at bedtime as needed for anxiety. 15 tablet 0  . aspirin EC 81 MG tablet Take 81 mg by mouth daily.    . Carboxymethylcellul-Glycerin (REFRESH OPTIVE OP) Place 1 drop into both eyes daily as needed (dry eyes).    . ferrous sulfate 325 (65 FE) MG tablet Take 325 mg by mouth 2 (two) times daily with a meal.     . fluticasone (FLONASE) 50 MCG/ACT nasal spray Place 2 sprays into both nostrils daily as needed for allergies or rhinitis.     . furosemide (LASIX) 20 MG tablet TAKE 1 TABLET (20 MG)  BY MOUTH EVERY OTHER DAY AND MAY TAKE AN ADDITIONAL 20 MG ON THE ODD DAYS IF NEEDED. 45 tablet 2  . levothyroxine (SYNTHROID) 75 MCG tablet TAKE 1 TABLET BY MOUTH ONCE A DAY BEFORE BREAKFAST 90 tablet 0  . meclizine (ANTIVERT) 25 MG tablet Take 0.5-1 tablets (12.5-25 mg total) by mouth 3 (three) times daily as needed for dizziness. 30 tablet 3  . meloxicam (MOBIC) 7.5 MG tablet Take 1 tablet (7.5 mg total) by mouth daily. (Patient taking differently: Take 7.5 mg by mouth as needed. ) 90 tablet 1  . omeprazole (PRILOSEC) 20 MG capsule Take 1 capsule (20 mg total) by mouth daily. 90 capsule 0  . polyethylene glycol (MIRALAX / GLYCOLAX) packet Take 8.5 g by mouth See admin instructions. Mix 1/2 scoop (8.5  g) in 8 oz orange juice and drink daily    . potassium chloride (  KLOR-CON) 10 MEQ tablet Take 1 tablet (10 mEq total) by mouth 2 (two) times daily. 180 tablet 0   No current facility-administered medications on file prior to visit.    LMP  (LMP Unknown)       Objective:   Physical Exam Vitals and nursing note reviewed.  Constitutional:      Appearance: Normal appearance. She is underweight. She is ill-appearing.  HENT:     Head: Normocephalic and atraumatic.     Right Ear: Tympanic membrane, ear canal and external ear normal. There is impacted cerumen.     Left Ear: Tympanic membrane, ear canal and external ear normal. There is impacted cerumen.     Nose: Nose normal. No congestion or rhinorrhea.     Mouth/Throat:     Mouth: Mucous membranes are moist.     Pharynx: Oropharynx is clear. No oropharyngeal exudate or posterior oropharyngeal erythema.  Eyes:     Extraocular Movements: Extraocular movements intact.     Pupils: Pupils are equal, round, and reactive to light.  Cardiovascular:     Rate and Rhythm: Normal rate and regular rhythm.     Pulses: Normal pulses.     Heart sounds: Murmur present. No friction rub. No gallop.   Pulmonary:     Effort: Pulmonary effort is normal. No respiratory distress.     Breath sounds: Normal breath sounds. No stridor. No wheezing, rhonchi or rales.  Chest:     Chest wall: No tenderness.  Abdominal:     General: Abdomen is flat. Bowel sounds are normal. There is no distension.     Palpations: Abdomen is soft. There is no mass.     Tenderness: There is no abdominal tenderness. There is no right CVA tenderness, left CVA tenderness, guarding or rebound.     Hernia: No hernia is present.  Musculoskeletal:        General: No swelling, tenderness, deformity or signs of injury. Normal range of motion.     Cervical back: Normal range of motion and neck supple.     Right lower leg: No edema.     Left lower leg: No edema.  Skin:    General: Skin  is warm and dry.     Capillary Refill: Capillary refill takes less than 2 seconds.     Coloration: Skin is not jaundiced or pale.     Findings: No bruising, erythema, lesion or rash.  Neurological:     General: No focal deficit present.     Mental Status: She is alert and oriented to person, place, and time.     Cranial Nerves: No cranial nerve deficit.     Sensory: No sensory deficit.     Motor: Weakness present.     Coordination: Coordination normal.     Gait: Gait abnormal.     Deep Tendon Reflexes: Reflexes normal.     Comments: Walks with assistance of her husband as well as a cane   Psychiatric:        Mood and Affect: Mood normal.        Behavior: Behavior normal.        Thought Content: Thought content normal.        Judgment: Judgment normal.       Assessment & Plan:  1. Routine general medical examination at a health care facility - Follow up in one year or sooner if needed - Would like her to put on some weight. Encouraged meal replacement shakes throughout the day  -  CBC with Differential/Platelet - CMP - TSH  2. Essential hypertension - No change in medications  - CBC with Differential/Platelet - CMP - TSH  3. Hypothyroidism, unspecified type - Consider increase in synthroid  - CBC with Differential/Platelet - CMP - TSH  4. Paroxysmal atrial fibrillation (HCC): CHA2DS2Vasc = 6; Not currently on AC. - Follow up with cardiology as directed  - CBC with Differential/Platelet - CMP - TSH  5. Coronary artery disease involving native coronary artery of native heart without angina pectoris - Follow up with Cardiology as directed - CBC with Differential/Platelet - CMP - TSH  6. Bilateral impacted cerumen - Unable to remove in the office. Ear wax is very hard  - Referral to ENT  7. Loss of appetite - Encouraged to supplement ensure meal replacements throughout the day    Shirline Freesory Jahziah Simonin, NP

## 2019-09-17 ENCOUNTER — Ambulatory Visit (INDEPENDENT_AMBULATORY_CARE_PROVIDER_SITE_OTHER): Payer: Medicare HMO | Admitting: Otolaryngology

## 2019-09-17 ENCOUNTER — Telehealth: Payer: Self-pay

## 2019-09-17 ENCOUNTER — Other Ambulatory Visit: Payer: Self-pay

## 2019-09-17 ENCOUNTER — Encounter (INDEPENDENT_AMBULATORY_CARE_PROVIDER_SITE_OTHER): Payer: Self-pay | Admitting: Otolaryngology

## 2019-09-17 VITALS — Temp 97.2°F

## 2019-09-17 DIAGNOSIS — H903 Sensorineural hearing loss, bilateral: Secondary | ICD-10-CM

## 2019-09-17 DIAGNOSIS — H6123 Impacted cerumen, bilateral: Secondary | ICD-10-CM

## 2019-09-17 NOTE — Telephone Encounter (Signed)
Pt end service report for 30-day event monitor received today. Reviewed by Dr. Oval Linsey and placed in Dr. Allison Quarry mailbox.

## 2019-09-17 NOTE — Progress Notes (Signed)
HPI: Deborah [name] is a 85 y.o. female who presents for evaluation of bilateral cerumen impactions.  She has not noted any significant hearing problems although she does not hear well.  Denies any ear pain or drainage.  Has been several years since she had her ears cleaned..  Past Medical History:  Diagnosis Date  . Anxiety   . Bradycardia   . CAD S/P percutaneous coronary angioplasty 11/2014   DES PCI to RCA. Cath was done for pre-operative evaluation for mitral repair; LM 30%, LAD 50%, RCA 90% (Promus DES)  . 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 Normal EF by echo.  . Diverticulitis   . Essential hypertension   . H/O: upper GI bleed 12/03/2014   While on ASA/Plavix & Warfarin --> Hct dropped to 18%; small AVM noted but no overt pathology.  [name] History of uterine cancer 2001   Status post hysterectomy  . Hyperlipidemia with target LDL less than 70    Coronary disease and thoracic aortic atheroma  . Hypothyroidism    On Levothyroxine  . Paroxysmal atrial fibrillation (HCC) 2014   Associated with sick sinus syndrome. -- Intolerant of beta blockers and diltiazem. Rate control with digoxin. Not on anticoagulation despite CHA2DS2Vasc 6  2/2 prior severe GI bleed on triple therapy  . PFO (patent foramen ovale)    Noted on TEE - L-R shunt (elsewhere reported it as a ASD)  . Severe mitral regurgitation by prior echocardiogram 09/2014   Seen on TEE to have severe MR with multiple jets. Vena contracted 0.8; Consideration had been ? Mitral E-Clip - put on hold after GI Bleed.  . Sick sinus syndrome (HCC)    Status post Loop Recorder: Medtronic Linq -- most recent interrogation 05/19/2016: 2 new pauses. One greater than 3 seconds at 0 320 the morning. Second was 4.4 seconds at 9:56 PM; also noted to have an episode of A. fib. --> Recommended further follow-up upon arrival to [facility].  . Thoracic aortic atherosclerosis (HCC)  09/2014   Moderate grade 3-4 atheroma noted on TEE  . Urinary tract infection   . Vasovagal syncope    Exacerbated by bradycardia, sick sinus syndrome, and mitral regurgitation.  . Vertigo, central, unspecified laterality    Chronic dizziness.   Past Surgical History:  Procedure Laterality Date  . ABDOMINAL HYSTERECTOMY  2001  . Cataract Surgery  Bilateral   . COLONOSCOPY W/ BIOPSIES  2006   2 polyps noted along with diverticulitis  . CORONARY ANGIOPLASTY WITH STENT PLACEMENT  11/2014   [facility]: 90% RCA - PCI with Promus DES.  [name] implanted cardiac monitor     . LOOP RECORDER INSERTION  07/24/2013   Medtronic Linq - to evaluate SSS for symptomatic bradycardia  . Right and Left Heart Catheterization  11/2014   RHC: RAP 4, RVP 32/4, PA peak 29/7/16. LVEDP 18.  CORS: 30% LM, 50% LAD, 90% RCA --> PCI (Promus DES)  . TEE WITHOUT CARDIOVERSION  09/2014   Normal LV function. Severe MR with multiple jets. Vena contractile 0.8.  PFO with L-R shunt; moderate grade 3-4 aortic atheroma  . TONSILLECTOMY    . TRANSTHORACIC ECHOCARDIOGRAM  07/2017   EF 60 to 65%.  Moderate-severe MR.  Moderate TR with elevated RVSP  . TRANSTHORACIC ECHOCARDIOGRAM  08/2014   Normal LV function with biatrial enlargement. Moderate-severe mitral and tricuspid insufficiency.   Social History   Socioeconomic History  .  Marital status: Married    Spouse name: Not on file  . Number of children: 2  . Years of education: Not on file  . Highest education level: Not on file  Occupational History    Comment: Retired Museum/gallery curator  Tobacco Use  . Smoking status: Former Smoker    Packs/day: 0.50    Years: 5.00    Pack years: 2.50    Types: Cigarettes    Start date: 1966    Quit date: 08/02/1970    Years since quitting: 49.1  . Smokeless tobacco: Never Used  Substance and Sexual Activity  . Alcohol use: Yes    Comment: "wine occasionally"  . Drug use: No  . Sexual activity:  Not on file  Other Topics Concern  . Not on file  Social History Narrative   Married for 50 years    Has a son, daughter died of breast cancer   One grandson    89 from [place] -( Lives there from May- October) to be closer to their son & only remaining family.   Social Determinants of Health   Financial Resource Strain:   . Difficulty of Paying Living Expenses: Not on file  Food Insecurity:   . Worried About Charity fundraiser in the Last Year: Not on file  . Ran Out of Food in the Last Year: Not on file  Transportation Needs:   . Lack of Transportation (Medical): Not on file  . Lack of Transportation (Non-Medical): Not on file  Physical Activity:   . Days of Exercise per Week: Not on file  . Minutes of Exercise per Session: Not on file  Stress:   . Feeling of Stress : Not on file  Social Connections:   . Frequency of Communication with Friends and Family: Not on file  . Frequency of Social Gatherings with Friends and Family: Not on file  . Attends Religious Services: Not on file  . Active Member of Clubs or Organizations: Not on file  . Attends Archivist Meetings: Not on file  . Marital Status: Not on file   Family History  Problem Relation Age of Onset  . Stomach cancer Father   . Cancer Brother   . Diabetes Brother   . Cancer Daughter    Allergies  Allergen Reactions  . Carvedilol Other (See Comments)    Bradycardia   . Diltiazem Other (See Comments)    Bradycardia  . Lopressor [Metoprolol] Other (See Comments)    Bradycardia   Prior to Admission medications   Medication Sig Start Date End Date Taking? Authorizing Provider  Acetaminophen (TYLENOL PO) Take 650 mg by mouth as needed. daily    Yes [provider]  ALPRAZolam (XANAX) 0.25 MG tablet Take 1 tablet (0.25 mg total) by mouth at bedtime as needed for anxiety. 08/01/19  Yes Nafziger, Tommi Rumps, NP  aspirin EC 81 MG tablet Take 81 mg by mouth daily.   Yes [provider]   Carboxymethylcellul-Glycerin (REFRESH OPTIVE OP) Place 1 drop into both eyes daily as needed (dry eyes).   Yes [provider]  ferrous sulfate 325 (65 FE) MG tablet Take 325 mg by mouth 2 (two) times daily with a meal.    Yes [provider]  fluticasone (FLONASE) 50 MCG/ACT nasal spray Place 2 sprays into both nostrils daily as needed for allergies or rhinitis.    Yes [provider]  furosemide (LASIX) 20 MG tablet TAKE 1 TABLET (20 MG)  BY MOUTH  EVERY OTHER DAY AND MAY TAKE AN ADDITIONAL 20 MG ON THE ODD DAYS IF NEEDED. 01/13/19  Yes Marykay LexHarding, David W, MD  levothyroxine (SYNTHROID) 75 MCG tablet TAKE 1 TABLET BY MOUTH ONCE A DAY BEFORE BREAKFAST 07/31/19  Yes Nafziger, Kandee Keenory, NP  meclizine (ANTIVERT) 25 MG tablet Take 0.5-1 tablets (12.5-25 mg total) by mouth 3 (three) times daily as needed for dizziness. 09/10/18  Yes Nafziger, Kandee Keenory, NP  meloxicam (MOBIC) 7.5 MG tablet Take 1 tablet (7.5 mg total) by mouth daily. Patient taking differently: Take 7.5 mg by mouth as needed.  08/07/17  Yes Nafziger, Kandee Keenory, NP  omeprazole (PRILOSEC) 20 MG capsule Take 1 capsule (20 mg total) by mouth daily. 07/31/19  Yes Nafziger, Kandee Keenory, NP  polyethylene glycol (MIRALAX / GLYCOLAX) packet Take 8.5 g by mouth See admin instructions. Mix 1/2 scoop (8.5 g) in 8 oz orange juice and drink daily   Yes [provider]  potassium chloride (KLOR-CON) 10 MEQ tablet Take 1 tablet (10 mEq total) by mouth 2 (two) times daily. 07/31/19  Yes Nafziger, Kandee Keenory, NP     Positive ROS: Otherwise negative  All other systems have been reviewed and were otherwise negative with the exception of those mentioned in the HPI and as above.  Physical Exam: Constitutional: Alert, well-appearing, no acute distress Ears: External ears without lesions or tenderness. Ear canals she has hard impacted cerumen on both sides.  This was removed in the office.  This was cleaned in the office.  Of note she has retraction  pockets.  Of both TMs posteriorly superiorly.  On the left side she has erosion of the long process of the incus with the TM adjacent to the capitulum of the stapes.  On tuning fork testing she has moderate bilateral SNHL worse on the left side. Nasal: External nose without lesions. Clear nasal passages Oral: Oropharynx clear. Neck: No palpable adenopathy or masses Respiratory: Breathing comfortably  Skin: No facial/neck lesions or rash noted.  Cerumen impaction removal  Date/Time: 09/17/2019 1:31 PM Performed by: Drema HalonNewman, Julie Nay E, MD Authorized by: Drema HalonNewman, Laksh Hinners E, MD   Consent:    Consent obtained:  Verbal   Consent given by:  Patient   Risks discussed:  Pain and bleeding Procedure details:    Location:  L ear and R ear   Procedure type: curette and forceps   Post-procedure details:    Inspection:  TM intact and canal normal   Hearing quality:  Improved   Patient tolerance of procedure:  Tolerated well, no immediate complications Comments:     She has posterior superior retraction pockets on both TMs.  On the left side she has erosion of the long process of the incus.    Assessment: Bilateral cerumen impactions Bilateral SNHL  Plan: This was cleaned in the office.  She will follow-up as needed. She would benefit from audiologic testing and use of hearing aids although she does not feel like she has that much of a hearing problem.  Narda Bondshris Nayab Aten, MD

## 2019-09-25 ENCOUNTER — Telehealth: Payer: Self-pay

## 2019-09-25 ENCOUNTER — Telehealth (INDEPENDENT_AMBULATORY_CARE_PROVIDER_SITE_OTHER): Payer: Medicare HMO | Admitting: Cardiology

## 2019-09-25 ENCOUNTER — Encounter: Payer: Self-pay | Admitting: Cardiology

## 2019-09-25 DIAGNOSIS — I2510 Atherosclerotic heart disease of native coronary artery without angina pectoris: Secondary | ICD-10-CM

## 2019-09-25 DIAGNOSIS — R55 Syncope and collapse: Secondary | ICD-10-CM | POA: Diagnosis not present

## 2019-09-25 DIAGNOSIS — Z1152 Encounter for screening for COVID-19: Secondary | ICD-10-CM | POA: Diagnosis not present

## 2019-09-25 DIAGNOSIS — I495 Sick sinus syndrome: Secondary | ICD-10-CM

## 2019-09-25 NOTE — Assessment & Plan Note (Signed)
No longer on Plavix because of issues with bleeding.  Only on aspirin.

## 2019-09-25 NOTE — Assessment & Plan Note (Signed)
The episode she had prior to her last visit was probably more consistent with vasovagal syncope with no significant pauses or VT/significant tachycardia on monitor.  Document the importance of adequate hydration, and pain attention to nighttime urination.  For this reason I want to avoid any sleep aid.

## 2019-09-25 NOTE — Assessment & Plan Note (Signed)
Discussed importance of maintaining distance which she is doing.  Also discussed making sure the beginning line to get the vaccine.  There are agreeable to have the vaccine and are eagerly waiting arrival.  They would like to go see their family up closer personal again

## 2019-09-25 NOTE — Progress Notes (Signed)
Virtual Visit via Video Note   This visit type was conducted due to national recommendations for restrictions regarding the COVID-19 Pandemic (e.g. social distancing) in an effort to limit this patient's exposure and mitigate transmission in our community.  Due to her co-morbid illnesses, this patient is at least at moderate risk for complications without adequate follow up.  This format is felt to be most appropriate for this patient at this time.  All issues noted in this document were discussed and addressed.  A limited physical exam was performed with this format.  Please refer to the patient's chart for her consent to telehealth for [facility].   Patient has given verbal permission to conduct this visit via virtual appointment and to bill insurance 09/25/2019 12:58 PM     Evaluation Performed:  Follow-up visit  Date:  09/25/2019   ID:  Deborah [name], DOB January 09, 1931, MRN [id number]  Patient Location: Home Provider Location: Other:  Hospital Office  PCP:  Shirline FreesNafziger, [name], NP  Cardiologist:  Bryan Lemmaavid Horald Birky, MD  Electrophysiologist:  None   Chief Complaint:   Chief Complaint  Patient presents with  . Follow-up    Monitor results after syncopal episode  . Loss of Consciousness    No recurrent episodes.  No severe findings on monitor.    History of Present Illness:    Deborah ApaRose Weyers is a 86 y.o. female with PMH notable for CAD-PCI, borderline severe MR and mild HFpEF along with PAF-tachybradycardia who presents via audio/video conferencing for a telehealth visit today to discuss results of her monitor.  Deborah ApaRose Bazar was last seen on July 22, 2019 for emergency room follow-up after an episode of syncope while going to the bathroom. -> 30-day event monitor ordered.  Hospitalizations:  . None   Recent - Interim CV studies:   The following studies were reviewed today: . 30-day event monitor: Roughly 24% A. fib with rates running from 50 to 150 bpm.  Otherwise sinus rhythm with  PACs.  No significant pauses noted.  Several short bursts of 3-4 beats PVCs versus aberrantly conducted PACs.  Symptoms noted for relatively benign findings of sinus rhythm with PACs or stable A. fib.  Not with rapid A. fib.  Inerval History   Deborah ApaRose Fargo seems to be doing better since last time I saw her.  Essentially all of the history is provided by her husband Merton Borderrthur.  She is he says that she has not had any further passout spells but does have dizzy spells that are relatively random.  She denies any chest pain or pressure with rest or exertion.  No real exertional dyspnea besides her baseline. They had some questions about her difficulty falling asleep.  We can of concluded that using OTC medicine such as Tylenol sleep medication would not be a good idea because of the soporific effect.  I think they have agreed to try melatonin.  Still she is doing better than she had ever done while on medications since we stopped the medications that she was on.  She does not have any sensation whatsoever being in A. fib or not.  No sensation of irregular heartbeats or palpitations.  Edema is well controlled with Lasix and support stockings.  She is not very active now, and she and her husband are pretty much locked away in the house.  Cardiovascular ROS: no chest pain or dyspnea on exertion positive for - Well-controlled swelling; sometimes dizzy but not necessarily associated with any particular activity or with orthostasis. negative for -  irregular heartbeat, loss of consciousness, orthopnea, palpitations, paroxysmal nocturnal dyspnea, rapid heart rate, shortness of breath or Has had some mild near syncope but no true syncope.  No TIA or amaurosis fugax.  No claudication.   ROS:  Please see the history of present illness.    The patient does not have symptoms concerning for COVID-19 infection (fever, chills, cough, or new shortness of breath).  Review of Systems  Constitutional: Positive for  malaise/fatigue (No real change from her baseline.). Negative for weight loss.  HENT: Negative for congestion and nosebleeds.   Respiratory: Positive for cough (Rare).   Cardiovascular: Negative for leg swelling (Well controlled with support stockings and low-dose Lasix every other day).  Gastrointestinal: Negative for abdominal pain, blood in stool and melena.  Genitourinary: Negative for hematuria.  Musculoskeletal: Positive for joint pain. Negative for falls.  Neurological: Positive for dizziness (Still has dizzy spells, mostly random).       Poor unsteady gait  Psychiatric/Behavioral: Positive for memory loss. Negative for depression. The patient is nervous/anxious and has insomnia.     The patient is practicing social distancing.  Past Medical History:  Diagnosis Date  . Anxiety   . Bradycardia   . CAD S/P percutaneous coronary angioplasty 11/2014   DES PCI to RCA. Cath was done for pre-operative evaluation for mitral repair; LM 30%, LAD 50%, RCA 90% (Promus DES)  . 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 Normal EF by echo.  . Diverticulitis   . Essential hypertension   . H/O: upper GI bleed 12/03/2014   While on ASA/Plavix & Warfarin --> Hct dropped to 18%; small AVM noted but no overt pathology.  [name] History of uterine cancer 2001   Status post hysterectomy  . Hyperlipidemia with target LDL less than 70    Coronary disease and thoracic aortic atheroma  . Hypothyroidism    On Levothyroxine  . Paroxysmal atrial fibrillation (HCC) 2014   Associated with sick sinus syndrome. -- Intolerant of beta blockers and diltiazem. Rate control with digoxin. Not on anticoagulation despite CHA2DS2Vasc 6  2/2 prior severe GI bleed on triple therapy  . PFO (patent foramen ovale)    Noted on TEE - L-R shunt (elsewhere reported it as a ASD)  . Severe mitral regurgitation by prior echocardiogram 09/2014   Seen on TEE to have  severe MR with multiple jets. Vena contracted 0.8; Consideration had been ? Mitral E-Clip - put on hold after GI Bleed.  . Sick sinus syndrome (HCC)    Status post Loop Recorder: Medtr[employer] Linq -- most recent interrogation 05/19/2016: 2 new pauses. One greater than 3 seconds at 0 320 the morning. Second was 4.4 seconds at 9:56 PM; also noted to have an episode of A. fib. --> Recommended further follow-up upon arrival to [facility].  . Thoracic aortic atherosclerosis (HCC) 09/2014   Moderate grade 3-4 atheroma noted on TEE  . Urinary tract infection   . Vasovagal syncope    Exacerbated by bradycardia, sick sinus syndrome, and mitral regurgitation.  . Vertigo, central, unspecified laterality    Chronic dizziness.   Past Surgical History:  Procedure Laterality Date  . ABDOMINAL HYSTERECTOMY  2001  . Cataract Surgery  Bilateral   . COLONOSCOPY W/ BIOPSIES  2006   2 polyps noted along with diverticulitis  . CORONARY ANGIOPLASTY WITH STENT PLACEMENT  11/2014   [facility]: 90% RCA - PCI with Promus DES.  [name]  implanted cardiac monitor     . LOOP RECORDER INSERTION  07/24/2013   Medtronic Linq - to evaluate SSS for symptomatic bradycardia  . Right and Left Heart Catheterization  11/2014   RHC: RAP 4, RVP 32/4, PA peak 29/7/16. LVEDP 18.  CORS: 30% LM, 50% LAD, 90% RCA --> PCI (Promus DES)  . TEE WITHOUT CARDIOVERSION  09/2014   Normal LV function. Severe MR with multiple jets. Vena contractile 0.8.  PFO with L-R shunt; moderate grade 3-4 aortic atheroma  . TONSILLECTOMY    . TRANSTHORACIC ECHOCARDIOGRAM  07/2017   EF 60 to 65%.  Moderate-severe MR.  Moderate TR with elevated RVSP  . TRANSTHORACIC ECHOCARDIOGRAM  08/2014   Normal LV function with biatrial enlargement. Moderate-severe mitral and tricuspid insufficiency.     Current Meds  Medication Sig  . Acetaminophen (TYLENOL PO) Take 650 mg by mouth as needed. daily   . ALPRAZolam (XANAX) 0.25 MG  tablet Take 1 tablet (0.25 mg total) by mouth at bedtime as needed for anxiety.  [name] aspirin EC 81 MG tablet Take 81 mg by mouth daily.  . Carboxymethylcellul-Glycerin (REFRESH OPTIVE OP) Place 1 drop into both eyes daily as needed (dry eyes).  . ferrous sulfate 325 (65 FE) MG tablet Take 325 mg by mouth 2 (two) times daily with a meal.   . fluticasone (FLONASE) 50 MCG/ACT nasal spray Place 2 sprays into both nostrils daily as needed for allergies or rhinitis.   . furosemide (LASIX) 20 MG tablet TAKE 1 TABLET (20 MG)  BY MOUTH EVERY OTHER DAY AND MAY TAKE AN ADDITIONAL 20 MG ON THE ODD DAYS IF NEEDED.  [name] levothyroxine (SYNTHROID) 75 MCG tablet TAKE 1 TABLET BY MOUTH ONCE A DAY BEFORE BREAKFAST  . meclizine (ANTIVERT) 25 MG tablet Take 0.5-1 tablets (12.5-25 mg total) by mouth 3 (three) times daily as needed for dizziness.  . meloxicam (MOBIC) 7.5 MG tablet Take 1 tablet (7.5 mg total) by mouth daily. (Patient taking differently: Take 7.5 mg by mouth as needed. )  . omeprazole (PRILOSEC) 20 MG capsule Take 1 capsule (20 mg total) by mouth daily.  . polyethylene glycol (MIRALAX / GLYCOLAX) packet Take 8.5 g by mouth See admin instructions. Mix 1/2 scoop (8.5 g) in 8 oz orange juice and drink daily  . potassium chloride (KLOR-CON) 10 MEQ tablet Take 1 tablet (10 mEq total) by mouth 2 (two) times daily.     Allergies:   Carvedilol, Diltiazem, and Lopressor [metoprolol]   Social History   Tobacco Use  . Smoking status: Former Smoker    Packs/day: 0.50    Years: 5.00    Pack years: 2.50    Types: Cigarettes    Start date: 1966    Quit date: 08/02/1970    Years since quitting: 49.1  . Smokeless tobacco: Never Used  Substance Use Topics  . Alcohol use: Yes    Comment: "wine occasionally"  . Drug use: No     Family Hx: The patient's family history includes Cancer in her brother and daughter; Diabetes in her brother; Stomach cancer in her father.   Labs/Other Tests and Data Reviewed:     EKG:  No ECG reviewed.  Recent Labs: 09/10/2019: ALT 11; BUN 16; Creatinine, Ser 0.81; Hemoglobin 12.3; Platelets 381.0; Potassium 4.9; Sodium 134; TSH 2.51   Recent Lipid Panel Lab Results  Component Value Date/Time   CHOL 169 07/19/2017 10:13 AM   TRIG 152.0 (H) 07/19/2017 10:13 AM   HDL 54.80 07/19/2017  10:13 AM   CHOLHDL 3 07/19/2017 10:13 AM   LDLCALC 83 07/19/2017 10:13 AM    Wt Readings from Last 3 Encounters:  09/25/19 117 lb (53.1 kg)  09/10/19 113 lb (51.3 kg)  07/22/19 120 lb (54.4 kg)     Objective:    Vital Signs:  BP 138/70   Pulse 66   Ht 5' (1.524 m)   Wt 117 lb (53.1 kg)   LMP  (LMP Unknown)   BMI 22.85 kg/m   VITAL SIGNS:  reviewed GEN:  no acute distress RESPIRATORY:  normal respiratory effort, symmetric expansion NEURO:  alert and oriented x 3, no obvious focal deficit PSYCH:  Somewhat shy, flat affect.  Very poor historian.   ASSESSMENT & PLAN:    Problem List Items Addressed This Visit    Coronary artery disease involving native heart without angina pectoris (Chronic)    Distant history of PCI to the RCA back in 2016.  No recurrent angina or heart failure symptoms.  We have pretty much taken off most of her medications besides aspirin and as needed Lasix.  Because of fatigue dizziness etc., she is no longer on beta-blocker, calcium channel blocker or ARB/ACE inhibitor.      SSS (sick sinus syndrome) (HCC) (Chronic)    She has had a history of sick sinus syndrome, but doing much better since we stopped AV nodal agents.  Did not show any significant bradycardia on her monitor.  Effective heart rate did not go below 50 bpm.  No significant pauses noted. She does go in and out of A. fib, and may have some dizziness associated with tachycardia, but would not treat.      Vasovagal syncope (Chronic)    The episode she had prior to her last visit was probably more consistent with vasovagal syncope with no significant pauses or VT/significant  tachycardia on monitor.  Document the importance of adequate hydration, and pain attention to nighttime urination.  For this reason I want to avoid any sleep aid.      Encounter for screening for COVID-19    Discussed importance of maintaining distance which she is doing.  Also discussed making sure the beginning line to get the vaccine.  There are agreeable to have the vaccine and are eagerly waiting arrival.  They would like to go see their family up closer personal again         COVID-19 Education: The signs and symptoms of COVID-19 were discussed with the patient and how to seek care for testing (follow up with PCP or arrange E-visit).   The importance of social distancing was discussed today.  We also spent time discussing the vaccine and importance and necessity of getting set up to get the vaccine.  Time:   Today, I have spent 28 minutes with the patient with telehealth technology discussing the above problems.      Current medicines are reviewed at length with the patient today.  Concerns regarding medicines are outlined above.    Patient Instructions  Medication Instructions:  Your physician recommends that you continue on your current medications as directed. Please refer to the Current Medication list given to you today.  *If you need a refill on your cardiac medications before your next appointment, please call your pharmacy*  Lab Work: NONE ordered at this time of appointment   If you have labs (blood work) drawn today and your tests are completely normal, you will receive your results only by: [name] MyChart Message (if  you have MyChart) OR . A paper copy in the mail If you have any lab test that is abnormal or we need to change your treatment, we will call you to review the results.  Testing/Procedures: NONE ordered at this time of appointment   Follow-Up: At [facility], you and your health needs are our priority.  As part of our continuing mission to provide  you with exceptional heart care, we have created designated Provider Care Teams.  These Care Teams include your primary Cardiologist (physician) and Advanced Practice Providers (APPs -  Physician Assistants and Nurse Practitioners) who all work together to provide you with the care you need, when you need it.  Your next appointment:   6 month(s)  The format for your next appointment:   Either In Person or Virtual  Provider:   Glenetta Hew, MD  Other Instructions Keep staying adequately hydrated     Signed, Glenetta Hew, MD  09/25/2019 12:58 PM    [place]

## 2019-09-25 NOTE — Patient Instructions (Addendum)
Medication Instructions:  Your physician recommends that you continue on your current medications as directed. Please refer to the Current Medication list given to you today.  *If you need a refill on your cardiac medications before your next appointment, please call your pharmacy*  Lab Work: NONE ordered at this time of appointment   If you have labs (blood work) drawn today and your tests are completely normal, you will receive your results only by: [name] MyChart Message (if you have MyChart) OR . A paper copy in the mail If you have any lab test that is abnormal or we need to change your treatment, we will call you to review the results.  Testing/Procedures: NONE ordered at this time of appointment   Follow-Up: At [facility], you and your health needs are our priority.  As part of our continuing mission to provide you with exceptional heart care, we have created designated Provider Care Teams.  These Care Teams include your primary Cardiologist (physician) and Advanced Practice Providers (APPs -  Physician Assistants and Nurse Practitioners) who all work together to provide you with the care you need, when you need it.  Your next appointment:   6 month(s)  The format for your next appointment:   Either In Person or Virtual  Provider:   Bryan Lemma, MD  Other Instructions Keep staying adequately hydrated

## 2019-09-25 NOTE — Telephone Encounter (Signed)
Called patient's husband Divine Imber to discuss AVS instructions gave Dr. Elissa Hefty recommendations and he voiced understanding. AVS summary mailed to patient.

## 2019-09-25 NOTE — Assessment & Plan Note (Signed)
She has had a history of sick sinus syndrome, but doing much better since we stopped AV nodal agents.  Did not show any significant bradycardia on her monitor.  Effective heart rate did not go below 50 bpm.  No significant pauses noted. She does go in and out of A. fib, and may have some dizziness associated with tachycardia, but would not treat.

## 2019-09-25 NOTE — Assessment & Plan Note (Signed)
Distant history of PCI to the RCA back in 2016.  No recurrent angina or heart failure symptoms.  We have pretty much taken off most of her medications besides aspirin and as needed Lasix.  Because of fatigue dizziness etc., she is no longer on beta-blocker, calcium channel blocker or ARB/ACE inhibitor.

## 2019-10-03 ENCOUNTER — Emergency Department (HOSPITAL_COMMUNITY): Payer: Medicare HMO

## 2019-10-03 ENCOUNTER — Other Ambulatory Visit: Payer: Self-pay

## 2019-10-03 ENCOUNTER — Encounter (HOSPITAL_COMMUNITY): Payer: Self-pay

## 2019-10-03 ENCOUNTER — Inpatient Hospital Stay (HOSPITAL_COMMUNITY)
Admission: EM | Admit: 2019-10-03 | Discharge: 2019-10-07 | DRG: 309 | Disposition: A | Discharge status: Home Health | Payer: Medicare HMO | Attending: Internal Medicine | Admitting: Internal Medicine

## 2019-10-03 DIAGNOSIS — Z833 Family history of diabetes mellitus: Secondary | ICD-10-CM

## 2019-10-03 DIAGNOSIS — I480 Paroxysmal atrial fibrillation: Principal | ICD-10-CM | POA: Diagnosis present

## 2019-10-03 DIAGNOSIS — R4182 Altered mental status, unspecified: Secondary | ICD-10-CM

## 2019-10-03 DIAGNOSIS — Y92239 Unspecified place in hospital as the place of occurrence of the external cause: Secondary | ICD-10-CM | POA: Diagnosis present

## 2019-10-03 DIAGNOSIS — F0390 Unspecified dementia without behavioral disturbance: Secondary | ICD-10-CM | POA: Diagnosis present

## 2019-10-03 DIAGNOSIS — I959 Hypotension, unspecified: Secondary | ICD-10-CM

## 2019-10-03 DIAGNOSIS — E785 Hyperlipidemia, unspecified: Secondary | ICD-10-CM | POA: Diagnosis present

## 2019-10-03 DIAGNOSIS — Z6821 Body mass index (BMI) 21.0-21.9, adult: Secondary | ICD-10-CM

## 2019-10-03 DIAGNOSIS — Z87891 Personal history of nicotine dependence: Secondary | ICD-10-CM

## 2019-10-03 DIAGNOSIS — Y848 Other medical procedures as the cause of abnormal reaction of the patient, or of later complication, without mention of misadventure at the time of the procedure: Secondary | ICD-10-CM | POA: Diagnosis present

## 2019-10-03 DIAGNOSIS — Z90710 Acquired absence of both cervix and uterus: Secondary | ICD-10-CM

## 2019-10-03 DIAGNOSIS — I5032 Chronic diastolic (congestive) heart failure: Secondary | ICD-10-CM | POA: Diagnosis present

## 2019-10-03 DIAGNOSIS — E039 Hypothyroidism, unspecified: Secondary | ICD-10-CM | POA: Diagnosis present

## 2019-10-03 DIAGNOSIS — I4891 Unspecified atrial fibrillation: Secondary | ICD-10-CM | POA: Diagnosis present

## 2019-10-03 DIAGNOSIS — R55 Syncope and collapse: Secondary | ICD-10-CM | POA: Diagnosis present

## 2019-10-03 DIAGNOSIS — R42 Dizziness and giddiness: Secondary | ICD-10-CM | POA: Diagnosis not present

## 2019-10-03 DIAGNOSIS — R630 Anorexia: Secondary | ICD-10-CM | POA: Diagnosis present

## 2019-10-03 DIAGNOSIS — I2510 Atherosclerotic heart disease of native coronary artery without angina pectoris: Secondary | ICD-10-CM | POA: Diagnosis present

## 2019-10-03 DIAGNOSIS — I248 Other forms of acute ischemic heart disease: Secondary | ICD-10-CM | POA: Diagnosis present

## 2019-10-03 DIAGNOSIS — Z955 Presence of coronary angioplasty implant and graft: Secondary | ICD-10-CM

## 2019-10-03 DIAGNOSIS — Z79899 Other long term (current) drug therapy: Secondary | ICD-10-CM

## 2019-10-03 DIAGNOSIS — T8089XA Other complications following infusion, transfusion and therapeutic injection, initial encounter: Secondary | ICD-10-CM | POA: Diagnosis present

## 2019-10-03 DIAGNOSIS — Z8542 Personal history of malignant neoplasm of other parts of uterus: Secondary | ICD-10-CM

## 2019-10-03 DIAGNOSIS — I495 Sick sinus syndrome: Secondary | ICD-10-CM | POA: Diagnosis present

## 2019-10-03 DIAGNOSIS — R531 Weakness: Secondary | ICD-10-CM

## 2019-10-03 DIAGNOSIS — N390 Urinary tract infection, site not specified: Secondary | ICD-10-CM | POA: Diagnosis present

## 2019-10-03 DIAGNOSIS — E860 Dehydration: Secondary | ICD-10-CM | POA: Diagnosis present

## 2019-10-03 DIAGNOSIS — Z888 Allergy status to other drugs, medicaments and biological substances status: Secondary | ICD-10-CM

## 2019-10-03 DIAGNOSIS — Z79890 Hormone replacement therapy (postmenopausal): Secondary | ICD-10-CM

## 2019-10-03 DIAGNOSIS — Z20822 Contact with and (suspected) exposure to covid-19: Secondary | ICD-10-CM | POA: Diagnosis not present

## 2019-10-03 DIAGNOSIS — F411 Generalized anxiety disorder: Secondary | ICD-10-CM | POA: Diagnosis present

## 2019-10-03 DIAGNOSIS — Z66 Do not resuscitate: Secondary | ICD-10-CM | POA: Diagnosis present

## 2019-10-03 DIAGNOSIS — Z7982 Long term (current) use of aspirin: Secondary | ICD-10-CM

## 2019-10-03 DIAGNOSIS — I110 Hypertensive heart disease with heart failure: Secondary | ICD-10-CM | POA: Diagnosis present

## 2019-10-03 DIAGNOSIS — I499 Cardiac arrhythmia, unspecified: Secondary | ICD-10-CM

## 2019-10-03 LAB — BASIC METABOLIC PANEL
Anion gap: 12 (ref 5–15)
BUN: 12 mg/dL (ref 8–23)
CO2: 25 mmol/L (ref 22–32)
Calcium: 9 mg/dL (ref 8.9–10.3)
Chloride: 96 mmol/L — ABNORMAL LOW (ref 98–111)
Creatinine, Ser: 0.75 mg/dL (ref 0.44–1.00)
GFR calc Af Amer: >60 mL/min (ref >60)
GFR calc non Af Amer: >60 mL/min (ref >60)
Glucose, Bld: 145 mg/dL — ABNORMAL HIGH (ref 70–99)
Potassium: 3.9 mmol/L (ref 3.5–5.1)
Sodium: 133 mmol/L — ABNORMAL LOW (ref 135–145)

## 2019-10-03 LAB — TROPONIN I (HIGH SENSITIVITY): Troponin I (High Sensitivity): 10 ng/L (ref <18)

## 2019-10-03 LAB — MAGNESIUM: Magnesium: 2.1 mg/dL (ref 1.7–2.4)

## 2019-10-03 MED ORDER — IOHEXOL 350 MG/ML SOLN
100.0000 mL | Freq: Once | INTRAVENOUS | Status: AC | PRN
  Administered 2019-10-03: 22:00:00 100 mL via INTRAVENOUS

## 2019-10-03 MED ORDER — SODIUM CHLORIDE 0.9 % IV BOLUS
500.0000 mL | Freq: Once | INTRAVENOUS | Status: AC
  Administered 2019-10-03: 23:00:00 500 mL via INTRAVENOUS

## 2019-10-03 MED ORDER — ONDANSETRON HCL 4 MG/2ML IJ SOLN
4.0000 mg | Freq: Once | INTRAMUSCULAR | Status: AC
  Administered 2019-10-03: 21:00:00 4 mg via INTRAVENOUS

## 2019-10-03 MED ORDER — SODIUM CHLORIDE 0.9 % IV BOLUS
500.0000 mL | Freq: Once | INTRAVENOUS | Status: AC
  Administered 2019-10-04: 500 mL via INTRAVENOUS

## 2019-10-03 MED ORDER — DILTIAZEM HCL-DEXTROSE 125-5 MG/125ML-% IV SOLN (PREMIX)
5.0000 mg/h | INTRAVENOUS | Status: DC
  Administered 2019-10-03: 21:00:00 5 mg/h via INTRAVENOUS
  Filled 2019-10-03: qty 125

## 2019-10-03 MED ORDER — PHENYLEPHRINE 40 MCG/ML (10ML) SYRINGE FOR IV PUSH (FOR BLOOD PRESSURE SUPPORT)
80.0000 ug | PREFILLED_SYRINGE | Freq: Once | INTRAVENOUS | Status: AC
  Administered 2019-10-03: 21:00:00 80 ug via INTRAVENOUS

## 2019-10-03 MED ORDER — DILTIAZEM LOAD VIA INFUSION
10.0000 mg | Freq: Once | INTRAVENOUS | Status: DC
  Filled 2019-10-03: qty 10

## 2019-10-03 MED ORDER — SODIUM CHLORIDE 0.9 % IV BOLUS
500.0000 mL | Freq: Once | INTRAVENOUS | Status: AC
  Administered 2019-10-03: 21:00:00 500 mL via INTRAVENOUS

## 2019-10-03 NOTE — ED Triage Notes (Signed)
Pt arrives via [facility] c/o dizziness and weakness x 45 mins. Pt denies injury or syncope. A&O to baseline. VS: BP 125/74, HR 80 (afib), RR 18, SPO2 99% RA. Pt newly diagnosed with atrial fibrillation per EMS.

## 2019-10-03 NOTE — ED Notes (Signed)
Paged cardio to Dr Criss Alvine

## 2019-10-03 NOTE — ED Notes (Signed)
Cardizem held, [place], [place] to reevaluate.

## 2019-10-03 NOTE — ED Provider Notes (Signed)
[facility] EMERGENCY DEPARTMENT Provider Note   CSN: [id number] Arrival date & time: 10/03/19  2005     History Chief Complaint  Patient presents with  . Dizziness    Deborah [name] is a 86 y.o. female.  Deborah [name] is a 86 y.o. female with a history of paroxysmal A. fib, sick sinus syndrome, CAD, hypothyroidism, hyperlipidemia, hypertension, vasovagal syncope, vertigo, who presents to the emergency department accompanied by her husband for evaluation of dizziness.  Patient states that she suddenly became very dizzy which she describes as feeling like she was going to pass out.  She denies any associated headache, visual changes, facial asymmetry, states that she feels generally weak but has not noticed weakness numbness or tingling on one side of her body.  She went to use the bathroom before dinner about 45 minutes prior to arrival, and had a syncopal episodes, son caught her and she did not fall to the ground or strike her head.  Since then she has been feeling intermittently lightheaded and unwell.  She states that when symptoms first began she had a heart racing sensation that has persisted since then.  Husband states that over the past few days the patient has not been feeling well and has had some intermittent more mild episodes of similar dizziness and has been very fatigued and sleeping, she has not been wanting to eat or drink much.  Husband states that she takes aspirin but is not on any other blood thinners, takes Lasix every other day, does not take any other medicines for her heart. Patient is followed by Dr. Ellyn Hack with cardiology, last echo showed EF of 60-65%. Recently wore holter monitor which showed paroxysmal A. fib with heart rates ranging between 50-150.        Past Medical History:  Diagnosis Date  . Anxiety   . Bradycardia   . CAD S/P percutaneous coronary angioplasty 11/2014   DES PCI to RCA. Cath was done for pre-operative evaluation for mitral  repair; LM 30%, LAD 50%, RCA 90% (Promus DES)  . Chronic diastolic heart failure due to valvular disease ([place]) 2015   Related to severe mitral regurgitation. Normal EF by echo.  . Diverticulitis   . Essential hypertension   . H/O: upper GI bleed 12/03/2014   While on ASA/Plavix & Warfarin --> Hct dropped to 18%; small AVM noted but no overt pathology.  [name] History of uterine cancer 2001   Status post hysterectomy  . Hyperlipidemia with target LDL less than 70    Coronary disease and thoracic aortic atheroma  . Hypothyroidism    On Levothyroxine  . Paroxysmal atrial fibrillation ([place]) 2014   Associated with sick sinus syndrome. -- Intolerant of beta blockers and diltiazem. Rate control with digoxin. Not on anticoagulation despite CHA2DS2Vasc 6  2/2 prior severe GI bleed on triple therapy  . PFO (patent foramen ovale)    Noted on TEE - L-R shunt (elsewhere reported it as a ASD)  . Severe mitral regurgitation by prior echocardiogram 09/2014   Seen on TEE to have severe MR with multiple jets. Vena contracted 0.8; Consideration had been ? Mitral E-Clip - put on hold after GI Bleed.  . Sick sinus syndrome ([place])    Status post Loop Recorder: [employer] Linq -- most recent interrogation 05/19/2016: 2 new pauses. One greater than 3 seconds at 0 320 the morning. Second was 4.4 seconds at 9:56 PM; also noted to have an episode of A. fib. --> Recommended further follow-up  upon arrival to [place].  . Thoracic aortic atherosclerosis (HCC) 09/2014   Moderate grade 3-4 atheroma noted on TEE  . Urinary tract infection   . Vasovagal syncope    Exacerbated by bradycardia, sick sinus syndrome, and mitral regurgitation.  . Vertigo, central, unspecified laterality    Chronic dizziness.    Patient Active Problem List   Diagnosis Date Noted  . Encounter for screening for COVID-19 09/25/2019  . Leg swelling 01/13/2019  . Vertigo 12/31/2017  . Bleeding into the skin 10/22/2017  . Tremor 07/03/2017  .  Near syncope 07/03/2017  . Vasovagal syncope   . Hyperlipidemia with target LDL less than 70   . SSS (sick sinus syndrome) (HCC) 07/31/2016  . CAD S/P DES PCI to RCA 07/31/2016  . Anemia 07/31/2016  . Essential hypertension 07/13/2016  . Hypothyroidism 07/13/2016  . Coronary artery disease involving native heart without angina pectoris 12/02/2014  . Mitral regurgitation and aortic stenosis 09/30/2014  . Thoracic aortic atherosclerosis (HCC) 09/18/2014  . Chronic diastolic heart failure due to valvular disease (HCC) 09/18/2013  . Paroxysmal atrial fibrillation (HCC): CHA2DS2Vasc = 6; Not currently on [facility]. 09/18/2012    Past Surgical History:  Procedure Laterality Date  . ABDOMINAL HYSTERECTOMY  2001  . Cataract Surgery  Bilateral   . COLONOSCOPY W/ BIOPSIES  2006   2 polyps noted along with diverticulitis  . CORONARY ANGIOPLASTY WITH STENT PLACEMENT  11/2014   [facility]: 90% RCA - PCI with Promus DES.  [name] implanted cardiac monitor     . LOOP RECORDER INSERTION  07/24/2013   [employer]tr[employer] Linq - to evaluate SSS for symptomatic bradycardia  . Right and Left Heart Catheterization  11/2014   RHC: RAP 4, RVP 32/4, PA peak 29/7/16. LVEDP 18.  CORS: 30% LM, 50% LAD, 90% RCA --> PCI (Promus DES)  . TEE WITHOUT CARDIOVERSION  09/2014   Normal LV function. Severe MR with multiple jets. Vena contractile 0.8.  PFO with L-R shunt; moderate grade 3-4 aortic atheroma  . TONSILLECTOMY    . TRANSTHORACIC ECHOCARDIOGRAM  07/2017   EF 60 to 65%.  Moderate-severe MR.  Moderate TR with elevated RVSP  . TRANSTHORACIC ECHOCARDIOGRAM  08/2014   Normal LV function with biatrial enlargement. Moderate-severe mitral and tricuspid insufficiency.     OB History   No obstetric history on file.     Family History  Problem Relation Age of Onset  . Stomach cancer Father   . Cancer Brother   . Diabetes Brother   . Cancer Daughter     Social History   Tobacco Use  .  Smoking status: Former Smoker    Packs/day: 0.50    Years: 5.00    Pack years: 2.50    Types: Cigarettes    Start date: 1966    Quit date: 08/02/1970    Years since quitting: 49.2  . Smokeless tobacco: Never Used  Substance Use Topics  . Alcohol use: Yes    Comment: "wine occasionally"  . Drug use: No    Home Medications Prior to Admission medications   Medication Sig Start Date End Date Taking? Authorizing Provider  Acetaminophen (TYLENOL PO) Take 650 mg by mouth as needed. daily     [provider]  ALPRAZolam (XANAX) 0.25 MG tablet Take 1 tablet (0.25 mg total) by mouth at bedtime as needed for anxiety. 08/01/19   Shirline Frees, NP  aspirin EC 81 MG tablet Take 81 mg by mouth daily.  [provider]  Carboxymethylcellul-Glycerin (REFRESH OPTIVE OP) Place 1 drop into both eyes daily as needed (dry eyes).    [provider]  ferrous sulfate 325 (65 FE) MG tablet Take 325 mg by mouth 2 (two) times daily with a meal.     [provider]  fluticasone (FLONASE) 50 MCG/ACT nasal spray Place 2 sprays into both nostrils daily as needed for allergies or rhinitis.     [provider]  furosemide (LASIX) 20 MG tablet TAKE 1 TABLET (20 MG)  BY MOUTH EVERY OTHER DAY AND MAY TAKE AN ADDITIONAL 20 MG ON THE ODD DAYS IF NEEDED. 01/13/19   Marykay LexHarding, David W, MD  levothyroxine (SYNTHROID) 75 MCG tablet TAKE 1 TABLET BY MOUTH ONCE A DAY BEFORE BREAKFAST 07/31/19   Nafziger, Kandee Keenory, NP  meclizine (ANTIVERT) 25 MG tablet Take 0.5-1 tablets (12.5-25 mg total) by mouth 3 (three) times daily as needed for dizziness. 09/10/18   Nafziger, Kandee Keenory, NP  meloxicam (MOBIC) 7.5 MG tablet Take 1 tablet (7.5 mg total) by mouth daily. Patient taking differently: Take 7.5 mg by mouth as needed.  08/07/17   Nafziger, Kandee Keenory, NP  omeprazole (PRILOSEC) 20 MG capsule Take 1 capsule (20 mg total) by mouth daily. 07/31/19   Nafziger, Kandee Keenory, NP  polyethylene glycol (MIRALAX / GLYCOLAX)  packet Take 8.5 g by mouth See admin instructions. Mix 1/2 scoop (8.5 g) in 8 oz orange juice and drink daily    [provider]  potassium chloride (KLOR-CON) 10 MEQ tablet Take 1 tablet (10 mEq total) by mouth 2 (two) times daily. 07/31/19   Shirline FreesNafziger, [name], NP    Allergies    Carvedilol, Diltiazem, and Lopressor [metoprolol]  Review of Systems   Review of Systems  Constitutional: Positive for fatigue. Negative for chills and fever.  HENT: Negative.   Respiratory: Negative for cough and shortness of breath.   Cardiovascular: Positive for palpitations. Negative for chest pain and leg swelling.  Gastrointestinal: Negative for abdominal pain, diarrhea, nausea and vomiting.  Genitourinary: Negative for dysuria.  Musculoskeletal: Negative for myalgias.  Skin: Negative for color change and rash.  Neurological: Positive for dizziness, syncope, weakness (Generalized) and light-headedness. Negative for tremors, seizures, facial asymmetry, speech difficulty, numbness and headaches.  Psychiatric/Behavioral: The patient is nervous/anxious.     Physical Exam Updated Vital Signs BP 125/74   Pulse (!) 129   Temp 98.5 F (36.9 C) (Oral)   Resp 18   Ht 5' (1.524 m)   Wt 49.9 kg   LMP  (LMP Unknown)   SpO2 98%   BMI 21.48 kg/m   Physical Exam Vitals and nursing note reviewed.  Constitutional:      General: She is not in acute distress.    Appearance: She is well-developed. She is ill-appearing. She is not diaphoretic.  HENT:     Head: Normocephalic and atraumatic.     Mouth/Throat:     Mouth: Mucous membranes are moist.     Pharynx: Oropharynx is clear.  Eyes:     General:        Right eye: No discharge.        Left eye: No discharge.     Extraocular Movements: Extraocular movements intact.     Conjunctiva/sclera: Conjunctivae normal.     Pupils: Pupils are equal, round, and reactive to light.  Cardiovascular:     Rate and Rhythm: Tachycardia present. Rhythm irregular.      Pulses: Normal pulses.     Heart sounds: Normal  heart sounds. No murmur. No friction rub. No gallop.      Comments: On arrival patient is in A. fib with RVR with heart rates in the 120-130s, pulses intact in extremities Pulmonary:     Effort: Pulmonary effort is normal. No respiratory distress.     Breath sounds: Normal breath sounds. No wheezing or rales.     Comments: Respirations equal and unlabored, patient able to speak in full sentences, lungs clear to auscultation bilaterally Abdominal:     General: Bowel sounds are normal. There is no distension.     Palpations: Abdomen is soft. There is no mass.     Tenderness: There is no abdominal tenderness. There is no guarding.     Comments: Abdomen soft, nondistended, nontender to palpation in all quadrants without guarding or peritoneal signs  Musculoskeletal:        General: No deformity.     Cervical back: Neck supple.  Skin:    General: Skin is warm and dry.     Capillary Refill: Capillary refill takes less than 2 seconds.  Neurological:     Mental Status: She is alert.     Coordination: Coordination normal.     Comments: Speech is clear, able to follow commands CN III-XII intact Normal strength in upper and lower extremities bilaterally including dorsiflexion and plantar flexion, strong and equal grip strength Sensation normal to light and sharp touch Moves extremities without ataxia, coordination intact Normal finger-to-nose testing  Psychiatric:        Mood and Affect: Mood is anxious.        Behavior: Behavior normal.     ED Results / Procedures / Treatments   Labs (all labs ordered are listed, but only abnormal results are displayed) Labs Reviewed  BASIC METABOLIC PANEL - Abnormal; Notable for the following components:      Result Value   Sodium 133 (*)    Chloride 96 (*)    Glucose, Bld 145 (*)    All other components within normal limits  SARS CORONAVIRUS 2 (TAT 6-24 HRS)  RESPIRATORY PANEL BY RT PCR (FLU A&B,  COVID)  CULTURE, BLOOD (ROUTINE X 2)  CULTURE, BLOOD (ROUTINE X 2)  MAGNESIUM  CBC WITH DIFFERENTIAL/PLATELET  URINALYSIS, ROUTINE W REFLEX MICROSCOPIC  BRAIN NATRIURETIC PEPTIDE  CBC WITH DIFFERENTIAL/PLATELET  LACTIC ACID, PLASMA  LACTIC ACID, PLASMA  CBC WITH DIFFERENTIAL/PLATELET  TSH  TROPONIN I (HIGH SENSITIVITY)  TROPONIN I (HIGH SENSITIVITY)    EKG EKG Interpretation  Date/Time:  Friday October 03 2019 21:24:00 EST Ventricular Rate:  63 PR Interval:    QRS Duration: 94 QT Interval:  435 QTC Calculation: 446 R Axis:   -30 Text Interpretation: Sinus rhythm Atrial premature complex Prolonged PR interval Left axis deviation Abnormal R-wave progression, late transition afib no longer present Confirmed by Pricilla LovelessGoldston, Scott [phone]([id number]) on 10/03/2019 10:30:19 PM   Radiology CT Head Wo Contrast  Result Date: 10/03/2019 CLINICAL DATA:  Dizziness and weakness for 1 hour EXAM: CT HEAD WITHOUT CONTRAST TECHNIQUE: Contiguous axial images were obtained from the base of the skull through the vertex without intravenous contrast. COMPARISON:  07/02/2019 FINDINGS: Brain: No evidence of acute infarction, hemorrhage, hydrocephalus, extra-axial collection or mass lesion/mass effect. Diffuse atrophic and chronic white matter ischemic changes are again identified. Vascular: No hyperdense vessel or unexpected calcification. Skull: No focal fracture is noted. Heterogeneous attenuation of the skull is again identified similar to that noted on the prior exam. Sinuses/Orbits: No acute finding. Other: None. IMPRESSION: Chronic atrophic  and ischemic changes stable from the prior exam. No acute abnormality noted. Electronically Signed   By: Alcide Clever M.D.   On: 10/03/2019 23:35   DG Chest Port 1 View  Result Date: 10/03/2019 CLINICAL DATA:  Syncope. Dizziness and weakness. EXAM: PORTABLE CHEST 1 VIEW COMPARISON:  Radiograph 12/30/2017 FINDINGS: Significant patient rotation. Low lung volumes, similar to prior  exam. Mild cardiomegaly is grossly unchanged allowing for rotation. Aortic atherosclerosis. Implanted loop recorder in the left chest wall. No acute airspace disease, pulmonary edema, pneumothorax or large pleural effusion. Remote left clavicle fracture. Remote right rib fracture. IMPRESSION: Low lung volumes without acute abnormality. Aortic Atherosclerosis (ICD10-I70.0). Electronically Signed   By: Narda Rutherford M.D.   On: 10/03/2019 21:42    Procedures .Critical Care Performed by: Dartha Lodge, PA-C Authorized by: Dartha Lodge, PA-C   Critical care provider statement:    Critical care time (minutes):  45   Critical care was necessary to treat or prevent imminent or life-threatening deterioration of the following conditions:  Circulatory failure (A. fib and sick sinus syndrome with episode of hypotension)   Critical care was time spent personally by me on the following activities:  Discussions with consultants, evaluation of patient's response to treatment, examination of patient, ordering and performing treatments and interventions, ordering and review of laboratory studies, ordering and review of radiographic studies, pulse oximetry, re-evaluation of patient's condition, obtaining history from patient or surrogate and review of old charts   (including critical care time)  Medications Ordered in ED Medications  sodium chloride 0.9 % bolus 500 mL (has no administration in time range)  sodium chloride 0.9 % bolus 500 mL (0 mLs Intravenous Stopped 10/03/19 2237)  ondansetron (ZOFRAN) injection 4 mg (4 mg Intravenous Given 10/03/19 2129)  PHENYLephrine 40 mcg/ml in normal saline Adult IV Push Syringe (80 mcg Intravenous Given 10/03/19 2123)  iohexol (OMNIPAQUE) 350 MG/ML injection 100 mL (100 mLs Intravenous Contrast Given 10/03/19 2208)  sodium chloride 0.9 % bolus 500 mL (500 mLs Intravenous New Bag/Given 10/03/19 2326)    ED Course  I have reviewed the triage vital signs and the nursing  notes.  Pertinent labs & imaging results that were available during my care of the patient were reviewed by me and considered in my medical decision making (see chart for details).  Clinical Course as of Oct 03 21  Fri Oct 03, 2019  2941 86 year old female arrives in A. fib with RVR, complaining of feeling dizzy and lightheaded with heart palpitations, history of the same.  I suspect that patient's heart rate is the cause of her dizziness and lightheadedness husband describes syncopal episode at home but patient did not fall to the ground or hit her head.  Will plan for IV fluids and Cardizem as well as lab evaluation, anticipate admission.   [KF]  2040 Mild hyponatremia of 133, likely dehydration, glucose of 145, no other significant electrolyte derangements, normal renal function  Basic metabolic panel(!) [KF]  2110 Called to bedside by RN, patient's heart rate suddenly dropped from the 140s to 80s and patient became hypotensive, before receiving any cardizem. Patient is pale, diaphoretic and complaining of dizziness and feeling like she is going to pass out.    [KF]  2115 Patient appears to be going in and out of A. fib and sinus rhythm, with hypotension down to the 70s systolic, patient was given additional 500 cc bolus as well as a push dose of phenylephrine with improvement in her blood pressures.  She continues to complain of dizziness.  Continues to have irregular heart rhythms.  Dr. Criss Alvine spoke with cardiology fellow who will be down to see the patient.  We will also get CT scans of the head and dissection study given unexplained episode of hypotension, this could be related to conversion pause.  We will also add on lactic acid, blood cultures, and rapid COVID to assess for potential underlying cause.  As long as blood pressures remain stable anticipate medicine admission.   [KF]  2130 Cardiology at bedside, patient's husband has stated that he does not want transcutaneous pacing or  pacemaker and does not want patient to have CPR or intubation.  Cardiology recommends medicine admission for telemetry monitoring.  Patient has not tolerated medications for heart rate control or blood pressure well in the past, and was very symptomatic with hypotension here in the ED   [KF]  2142 Chest x-ray clear without evidence of pulmonary edema  DG Chest Port 1 View [KF]  2230 Notified by lab that some of patient's blood work clotted, nursing staff is working on recollection   [KF]  2335 Chronic atrophy and ischemic changes noted consistent with prior exams, no acute changes  CT Head Wo Contrast [KF]  2349 No evidence of dissection.  There are some likely chronic vascular changes noted as well as some chronic compression fractures  CT Angio Chest/Abd/Pel for Dissection W and/or Wo Contrast [KF]  2350 On reevaluation patient appears to be feeling much better, blood pressures remaining stable she is awake and alert, reports improvement in dizziness   [KF]  Sat Oct 04, 2019  0005 Patient had contrast extravasation of the left upper extremity there is swelling and erythema but distal pulses are intact, warm compresses applied   [KF]  0022 Additional lab work is pending prior to admission, care signed out to PA Elpidio Anis who will admit after additional lab work has returned.   [KF]    Clinical Course User Index [KF] Legrand Rams   MDM Rules/Calculators/A&P                      86 year old female with history of sick sinus syndrome and paroxysmal A. fib arrives with dizziness and syncopal episode, found to be in A. fib with RVR on arrival.  Shortly after my evaluation patient's heart rate dropped into the 80s and she became hypotensive and increasingly symptomatic, after IV fluids and small dose of phenylephrine the seem to improve.  Awaiting additional labs but patient's imaging has been reassuring.  Cardiology has seen the patient and they recommend medicine admission for  telemetry monitoring, patient and husband do not want pacemaker and patient is DNR.  She has responded well to fluids and blood pressures are remaining stable.  After additional lab work has returned to PA Elpidio Anis will call for hospital admission.  Final Clinical Impression(s) / ED Diagnoses Final diagnoses:  Paroxysmal atrial fibrillation (HCC)  Hypotension, unspecified hypotension type  Syncope, unspecified syncope type    Rx / DC Orders ED Discharge Orders    None       Dartha Lodge, [place] 10/04/19 0032    Pricilla Loveless, MD 10/06/19 [phone]

## 2019-10-03 NOTE — Progress Notes (Signed)
Pt had 100 ml isovue 350 contrast, left antecubital extravasation. Dr. Elgie Collard assessed patient, iv was removed, arm elevated. Verbal instructions were given to patient, a verbal handoff was given to Mirna Mires, RN. Extravasation discharge orders placed.

## 2019-10-03 NOTE — ED Notes (Signed)
Left AC infiltrated during perfusion of contrast dye in CT per CT tech. IV discontinued, extremity elevated/iced, and radiologist/EDP notified.

## 2019-10-03 NOTE — Consult Note (Signed)
Cardiology Consultation:   Patient ID: Deborah [name] MRN: [id number]; DOB: [birth year]  Admit date: 10/03/2019 Date of Consult: 10/03/2019  Primary Care Provider: Shirline Frees, NP Primary Cardiologist: Bryan Lemma, MD  Primary Electrophysiologist:  None    Patient Profile:   Deborah [name] is a 86 y.o. female with a hx of dementia, tachy-brady syndrome, A. Fib, hypothyroidism and CAD who is being seen today for the evaluation of irregular heart rate.  History of Present Illness:   Deborah [name] has a known history of atrial fibrillation with tachy-brady syndrome and is followed by Dr. Herbie Baltimore. Per her husband, they moved here a few years ago from [place] and she was having a number of problems at that time that have resolved with medication discontinuation. Her husband notes that she does not tolerate medications well for her blood pressure. Given this, she has been off of all rate control agents. She had a recent Holter monitor that showed intermittent A. Fib with rates between 50-150 bpm. She historically has had issues with near syncope when her rates are high but this has improved recently. She has never had any documented long pauses.  Her husband notes that she has been having a bad week overall and has been sleeping almost 20 hours a day. This evening around 6:30pm, she was walking to the bathroom with her son when she had a syncopal episode. Her husband reports that the son caught her and she was only unconscious for about 30 seconds. She did not hit her head. After she regained consciousness, her husband notes that she had some difficulties with speaking. He states that she has difficulties like this from time to time which has been attributed to anxiety and improves with xanax. They presented to the ER for further management.  In the ER, per report, she was initially tachycardic near the 140s and then had a conversion pause for ~2-3 seconds and a drop in her blood pressure requiring  some phenylephrine. She notes that she is confused and having trouble speaking. She denies any chest pain and her husband states that she has not complained of any either.   Of note, her husband notes that they are concerned about medications in her in general and are not sure if they would ever want a pacemaker if needed. He confirms that he would not want a temporary pacemaker or anything invasive. He states that he would not want her to be intubated or undergo CPR.   Heart Pathway Score:     Past Medical History:  Diagnosis Date  . Anxiety   . Bradycardia   . CAD S/P percutaneous coronary angioplasty 11/2014   DES PCI to RCA. Cath was done for pre-operative evaluation for mitral repair; LM 30%, LAD 50%, RCA 90% (Promus DES)  . 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 Normal EF by echo.  . Diverticulitis   . Essential hypertension   . H/O: upper GI bleed 12/03/2014   While on ASA/Plavix & Warfarin --> Hct dropped to 18%; small AVM noted but no overt pathology.  [name] History of uterine cancer 2001   Status post hysterectomy  . Hyperlipidemia with target LDL less than 70    Coronary disease and thoracic aortic atheroma  . Hypothyroidism    On Levothyroxine  . Paroxysmal atrial fibrillation (HCC) 2014   Associated with sick sinus syndrome. -- Intolerant of beta blockers and diltiazem. Rate control with digoxin. Not on anticoagulation  despite CHA2DS2Vasc 6  2/2 prior severe GI bleed on triple therapy  . PFO (patent foramen ovale)    Noted on TEE - L-R shunt (elsewhere reported it as a ASD)  . Severe mitral regurgitation by prior echocardiogram 09/2014   Seen on TEE to have severe MR with multiple jets. Vena contracted 0.8; Consideration had been ? Mitral E-Clip - put on hold after GI Bleed.  . Sick sinus syndrome (HCC)    Status post Loop Recorder: [employer] Linq -- most recent interrogation 05/19/2016: 2 new pauses. One  greater than 3 seconds at 0 320 the morning. Second was 4.4 seconds at 9:56 PM; also noted to have an episode of A. fib. --> Recommended further follow-up upon arrival to [facility].  . Thoracic aortic atherosclerosis (HCC) 09/2014   Moderate grade 3-4 atheroma noted on TEE  . Urinary tract infection   . Vasovagal syncope    Exacerbated by bradycardia, sick sinus syndrome, and mitral regurgitation.  . Vertigo, central, unspecified laterality    Chronic dizziness.    Past Surgical History:  Procedure Laterality Date  . ABDOMINAL HYSTERECTOMY  2001  . Cataract Surgery  Bilateral   . COLONOSCOPY W/ BIOPSIES  2006   2 polyps noted along with diverticulitis  . CORONARY ANGIOPLASTY WITH STENT PLACEMENT  11/2014   [facility]: 90% RCA - PCI with Promus DES.  [name] implanted cardiac monitor     . LOOP RECORDER INSERTION  07/24/2013   Medtronic Linq - to evaluate SSS for symptomatic bradycardia  . Right and Left Heart Catheterization  11/2014   RHC: RAP 4, RVP 32/4, PA peak 29/7/16. LVEDP 18.  CORS: 30% LM, 50% LAD, 90% RCA --> PCI (Promus DES)  . TEE WITHOUT CARDIOVERSION  09/2014   Normal LV function. Severe MR with multiple jets. Vena contractile 0.8.  PFO with L-R shunt; moderate grade 3-4 aortic atheroma  . TONSILLECTOMY    . TRANSTHORACIC ECHOCARDIOGRAM  07/2017   EF 60 to 65%.  Moderate-severe MR.  Moderate TR with elevated RVSP  . TRANSTHORACIC ECHOCARDIOGRAM  08/2014   Normal LV function with biatrial enlargement. Moderate-severe mitral and tricuspid insufficiency.     Home Medications:  Prior to Admission medications   Medication Sig Start Date End Date Taking? Authorizing Provider  Acetaminophen (TYLENOL PO) Take 650 mg by mouth as needed. daily     [provider]  ALPRAZolam (XANAX) 0.25 MG tablet Take 1 tablet (0.25 mg total) by mouth at bedtime as needed for anxiety. 08/01/19   Shirline Frees, NP  aspirin EC 81 MG tablet Take  81 mg by mouth daily.    [provider]  Carboxymethylcellul-Glycerin (REFRESH OPTIVE OP) Place 1 drop into both eyes daily as needed (dry eyes).    [provider]  ferrous sulfate 325 (65 FE) MG tablet Take 325 mg by mouth 2 (two) times daily with a meal.     [provider]  fluticasone (FLONASE) 50 MCG/ACT nasal spray Place 2 sprays into both nostrils daily as needed for allergies or rhinitis.     [provider]  furosemide (LASIX) 20 MG tablet TAKE 1 TABLET (20 MG)  BY MOUTH EVERY OTHER DAY AND MAY TAKE AN ADDITIONAL 20 MG ON THE ODD DAYS IF NEEDED. 01/13/19   Marykay Lex, MD  levothyroxine (SYNTHROID) 75 MCG tablet TAKE 1 TABLET BY MOUTH ONCE A DAY BEFORE BREAKFAST 07/31/19   Nafziger, Kandee Keen, NP  meclizine (ANTIVERT) 25 MG  tablet Take 0.5-1 tablets (12.5-25 mg total) by mouth 3 (three) times daily as needed for dizziness. 09/10/18   Nafziger, Kandee Keen, NP  meloxicam (MOBIC) 7.5 MG tablet Take 1 tablet (7.5 mg total) by mouth daily. Patient taking differently: Take 7.5 mg by mouth as needed.  08/07/17   Nafziger, Kandee Keen, NP  omeprazole (PRILOSEC) 20 MG capsule Take 1 capsule (20 mg total) by mouth daily. 07/31/19   Nafziger, Kandee Keen, NP  polyethylene glycol (MIRALAX / GLYCOLAX) packet Take 8.5 g by mouth See admin instructions. Mix 1/2 scoop (8.5 g) in 8 oz orange juice and drink daily    [provider]  potassium chloride (KLOR-CON) 10 MEQ tablet Take 1 tablet (10 mEq total) by mouth 2 (two) times daily. 07/31/19   Shirline Frees, NP    Inpatient Medications: Scheduled Meds:  Continuous Infusions:  PRN Meds:   Allergies:    Allergies  Allergen Reactions  . Carvedilol Other (See Comments)    Bradycardia   . Diltiazem Other (See Comments)    Bradycardia  . Lopressor [Metoprolol] Other (See Comments)    Bradycardia    Social History:   Social History   Socioeconomic History  . Marital status: Married    Spouse name: Not on file  .  Number of children: 2  . Years of education: Not on file  . Highest education level: Not on file  Occupational History    Comment: Retired Passenger transport manager  Tobacco Use  . Smoking status: Former Smoker    Packs/day: 0.50    Years: 5.00    Pack years: 2.50    Types: Cigarettes    Start date: 1966    Quit date: 08/02/1970    Years since quitting: 49.2  . Smokeless tobacco: Never Used  Substance and Sexual Activity  . Alcohol use: Yes    Comment: "wine occasionally"  . Drug use: No  . Sexual activity: Not on file  Other Topics Concern  . Not on file  Social History Narrative   Married for 59 years    Has a son, daughter died of breast cancer   One grandson    Moved from [place] -( Lives there from May- October) to be closer to their son & only remaining family.   Social Determinants of Health   Financial Resource Strain:   . Difficulty of Paying Living Expenses: Not on file  Food Insecurity:   . Worried About Programme researcher, broadcasting/film/video in the Last Year: Not on file  . Ran Out of Food in the Last Year: Not on file  Transportation Needs:   . Lack of Transportation (Medical): Not on file  . Lack of Transportation (Non-Medical): Not on file  Physical Activity:   . Days of Exercise per Week: Not on file  . Minutes of Exercise per Session: Not on file  Stress:   . Feeling of Stress : Not on file  Social Connections:   . Frequency of Communication with Friends and Family: Not on file  . Frequency of Social Gatherings with Friends and Family: Not on file  . Attends Religious Services: Not on file  . Active Member of Clubs or Organizations: Not on file  . Attends Banker Meetings: Not on file  . Marital Status: Not on file  Intimate Partner Violence:   . Fear of Current or Ex-Partner: Not on file  . Emotionally Abused: Not on file  . Physically Abused: Not on file  . Sexually Abused: Not on  file    Family History:    Family History  Problem Relation Age of Onset  .  Stomach cancer Father   . Cancer Brother   . Diabetes Brother   . Cancer Daughter      ROS:  Please see the history of present illness.  All other ROS reviewed and negative as best as able with the help of her husband. Some limitations due to patients underlying dementia.     Physical Exam/Data:   Vitals:   10/03/19 2011 10/03/19 2012 10/03/19 2100 10/03/19 2130  BP: 125/74  (!) 82/57 (!) 91/56  Pulse: (!) 129  69   Resp: 18  (!) 24 (!) 21  Temp: 98.5 F (36.9 C)     TempSrc: Oral     SpO2: 98%  96%   Weight:  49.9 kg    Height:  5' (1.524 m)     No intake or output data in the 24 hours ending 10/03/19 2218 Last 3 Weights 10/03/2019 09/25/2019 09/10/2019  Weight (lbs) 110 lb 117 lb 113 lb  Weight (kg) 49.896 kg 53.071 kg 51.256 kg     Body mass index is 21.48 kg/m.  General:  Thin, elderly woman, appears frail, temporal wasting  Neck: no JVD Cardiac:  Irregularly irregular, no murmurs, rubs or gallops Lungs:  clear to auscultation anteriorly   Abd: soft, nontender, no hepatomegaly  Ext: no edema Musculoskeletal:  No deformities, BUE and BLE strength normal and equal Skin: warm and dry  Psych:  Appears anxious  EKG:  The EKG was personally reviewed and demonstrates:  Normal sinus rhythm Telemetry:  Telemetry was personally reviewed and demonstrates:  A mix of atrial fibrillation, sinus rhythm, sinus bradycardia, and PACs. One conversion pause lasting ~2 seconds   Relevant CV Studies: Holter monitor 09/30/19  Predominantly sinus rhythm with PACs. (Rates from 51-113 bpm). Average heart rate for 65 bpm.  24% A. fib with variable rates 50 to 252 bpm..  Rare PACs and PVCs noted.  10 manually detected episodes-6 in sinus rhythm with PACs, 4 with A. fib (not RVR)    Roughly 24% A. fib with rates running from 50 to 150 bpm. Otherwise sinus rhythm with PACs. No significant pauses noted. Several short bursts of 3-4 beats PVCs versus aberrantly conducted PACs. Symptoms  noted for relatively benign findings of sinus rhythm with PACs or stable A. fib. Not with rapid A. Fib.  Echo 08/08/2017 Left ventricle: The cavity size was normal. Wall thickness was   increased in a pattern of moderate LVH. Systolic function was   normal. The estimated ejection fraction was in the range of 60%   to 65%. The study is not technically sufficient to allow   evaluation of LV diastolic function. - Aortic valve: Trileaflet. Sclerosis without stenosis. There was   mild regurgitation. - Mitral valve: Posterior calcified annulus. Mildly thickened   leaflets, dilated annulus. There was moderate to severe   regurgitation. Effective regurgitant orifice (PISA): 0.26 cm^2.   Regurgitant volume (PISA): 53 ml. - Left atrium: Severely dilated. - Right atrium: The atrium was mildly dilated. - Atrial septum: No defect or patent foramen ovale was identified. - Tricuspid valve: There was moderate regurgitation. - Pulmonary arteries: PA peak pressure: 42 mm Hg (S). - Systemic veins: The IVC measures <2.1 cm, but does not collapse   >50%, suggesting an elevated RA pressure of 8 mmHg.  Impressions:  - Compared to a prior study in 2017, the is now moderate to severe  MR - consider TEE to further evaluate. There is moderate TR with   an RVSP of 42 mmHg.  Laboratory Data:  High Sensitivity Troponin:   Recent Labs  Lab 10/03/19 2024  TROPONINIHS 10     Chemistry Recent Labs  Lab 10/03/19 2024  NA 133*  K 3.9  CL 96*  CO2 25  GLUCOSE 145*  BUN 12  CREATININE 0.75  CALCIUM 9.0  GFRNONAA >60  GFRAA >60  ANIONGAP 12    No results for input(s): PROT, ALBUMIN, AST, ALT, ALKPHOS, BILITOT in the last 168 hours. HematologyNo results for input(s): WBC, RBC, HGB, HCT, MCV, MCH, MCHC, RDW, PLT in the last 168 hours. BNPNo results for input(s): BNP, PROBNP in the last 168 hours.  DDimer No results for input(s): DDIMER in the last 168 hours.   Radiology/Studies:  DG Chest Port  1 View  Result Date: 10/03/2019 CLINICAL DATA:  Syncope. Dizziness and weakness. EXAM: PORTABLE CHEST 1 VIEW COMPARISON:  Radiograph 12/30/2017 FINDINGS: Significant patient rotation. Low lung volumes, similar to prior exam. Mild cardiomegaly is grossly unchanged allowing for rotation. Aortic atherosclerosis. Implanted loop recorder in the left chest wall. No acute airspace disease, pulmonary edema, pneumothorax or large pleural effusion. Remote left clavicle fracture. Remote right rib fracture. IMPRESSION: Low lung volumes without acute abnormality. Aortic Atherosclerosis (ICD10-I70.0). Electronically Signed   By: Narda RutherfordMelanie  Sanford M.D.   On: 10/03/2019 21:42     Assessment and Plan:   Deborah [name] is a 86 y.o. female with a hx of dementia, tachy-brady syndrome, A. Fib, hypothyroidism and CAD who is being seen today for the evaluation of irregular heart rate. She has long-standing history of A. Fib and tachy-brady syndrome which is consistent with what she has demonstrated on telemetry since arriving to the ER. She appears unwell overall with difficulty speaking and confusion. Furthermore, her husband notes that she has declined over the past week with significant fatigue raising concern for and underlying process such as infection. She reportedly had an episode of significant hypotension when converting from A. Fib back to sinus rhythm. While she has not had any significant pauses on tele or severe bradycardia to cause hypotension, it is possible that she has a physiologic need for higher heart rates at this time (again, concern for underlying sepsis). Her husband is very clear that they would not want external pacing, transvenous pacing wires, or even permanent pacemaker placement. She is DNR. Would recommend admission to medicine for work-up of altered mental status and somnolence and monitor her heart rates on telemetry. Agree with CT head given her long-standing A. Fib while not on anticoagulation  (shared decision making in the past regarding prior significant GI bleeds).  - Monitor on tele - Admit to medicine - Agree with CT head - Avoid AV nodal blocking agents - Patient and her husband are not interested in any kind of pacemaker at this time or any invasive measures - Cardiology will continue to follow       For questions or updates, please contact [facility] Please consult [email or web address] for contact info under     Signed, Nicoletta Baoniglio, Dailin Sosnowski C, MD  10/03/2019 10:18 PM

## 2019-10-04 ENCOUNTER — Observation Stay (HOSPITAL_COMMUNITY): Payer: Medicare HMO

## 2019-10-04 DIAGNOSIS — I480 Paroxysmal atrial fibrillation: Principal | ICD-10-CM

## 2019-10-04 DIAGNOSIS — I495 Sick sinus syndrome: Secondary | ICD-10-CM | POA: Diagnosis present

## 2019-10-04 DIAGNOSIS — Z955 Presence of coronary angioplasty implant and graft: Secondary | ICD-10-CM | POA: Diagnosis not present

## 2019-10-04 DIAGNOSIS — N390 Urinary tract infection, site not specified: Secondary | ICD-10-CM | POA: Diagnosis present

## 2019-10-04 DIAGNOSIS — Z6821 Body mass index (BMI) 21.0-21.9, adult: Secondary | ICD-10-CM | POA: Diagnosis not present

## 2019-10-04 DIAGNOSIS — E785 Hyperlipidemia, unspecified: Secondary | ICD-10-CM | POA: Diagnosis present

## 2019-10-04 DIAGNOSIS — R4182 Altered mental status, unspecified: Secondary | ICD-10-CM

## 2019-10-04 DIAGNOSIS — R531 Weakness: Secondary | ICD-10-CM | POA: Diagnosis not present

## 2019-10-04 DIAGNOSIS — R4020 Unspecified coma: Secondary | ICD-10-CM | POA: Diagnosis not present

## 2019-10-04 DIAGNOSIS — I951 Orthostatic hypotension: Secondary | ICD-10-CM | POA: Diagnosis not present

## 2019-10-04 DIAGNOSIS — E860 Dehydration: Secondary | ICD-10-CM | POA: Diagnosis present

## 2019-10-04 DIAGNOSIS — F0390 Unspecified dementia without behavioral disturbance: Secondary | ICD-10-CM | POA: Diagnosis present

## 2019-10-04 DIAGNOSIS — I959 Hypotension, unspecified: Secondary | ICD-10-CM

## 2019-10-04 DIAGNOSIS — I4891 Unspecified atrial fibrillation: Secondary | ICD-10-CM | POA: Diagnosis not present

## 2019-10-04 DIAGNOSIS — R630 Anorexia: Secondary | ICD-10-CM | POA: Diagnosis present

## 2019-10-04 DIAGNOSIS — Z7982 Long term (current) use of aspirin: Secondary | ICD-10-CM | POA: Diagnosis not present

## 2019-10-04 DIAGNOSIS — Z20822 Contact with and (suspected) exposure to covid-19: Secondary | ICD-10-CM | POA: Diagnosis present

## 2019-10-04 DIAGNOSIS — R55 Syncope and collapse: Secondary | ICD-10-CM

## 2019-10-04 DIAGNOSIS — Y92239 Unspecified place in hospital as the place of occurrence of the external cause: Secondary | ICD-10-CM | POA: Diagnosis present

## 2019-10-04 DIAGNOSIS — I110 Hypertensive heart disease with heart failure: Secondary | ICD-10-CM | POA: Diagnosis present

## 2019-10-04 DIAGNOSIS — T8089XA Other complications following infusion, transfusion and therapeutic injection, initial encounter: Secondary | ICD-10-CM | POA: Diagnosis present

## 2019-10-04 DIAGNOSIS — I248 Other forms of acute ischemic heart disease: Secondary | ICD-10-CM | POA: Diagnosis present

## 2019-10-04 DIAGNOSIS — Z8542 Personal history of malignant neoplasm of other parts of uterus: Secondary | ICD-10-CM | POA: Diagnosis not present

## 2019-10-04 DIAGNOSIS — Z90710 Acquired absence of both cervix and uterus: Secondary | ICD-10-CM | POA: Diagnosis not present

## 2019-10-04 DIAGNOSIS — R42 Dizziness and giddiness: Secondary | ICD-10-CM | POA: Diagnosis not present

## 2019-10-04 DIAGNOSIS — F411 Generalized anxiety disorder: Secondary | ICD-10-CM | POA: Diagnosis present

## 2019-10-04 DIAGNOSIS — Y848 Other medical procedures as the cause of abnormal reaction of the patient, or of later complication, without mention of misadventure at the time of the procedure: Secondary | ICD-10-CM | POA: Diagnosis present

## 2019-10-04 DIAGNOSIS — I2510 Atherosclerotic heart disease of native coronary artery without angina pectoris: Secondary | ICD-10-CM | POA: Diagnosis present

## 2019-10-04 DIAGNOSIS — I5032 Chronic diastolic (congestive) heart failure: Secondary | ICD-10-CM | POA: Diagnosis present

## 2019-10-04 DIAGNOSIS — Z79899 Other long term (current) drug therapy: Secondary | ICD-10-CM | POA: Diagnosis not present

## 2019-10-04 DIAGNOSIS — I499 Cardiac arrhythmia, unspecified: Secondary | ICD-10-CM

## 2019-10-04 DIAGNOSIS — E039 Hypothyroidism, unspecified: Secondary | ICD-10-CM | POA: Diagnosis present

## 2019-10-04 DIAGNOSIS — Z66 Do not resuscitate: Secondary | ICD-10-CM | POA: Diagnosis present

## 2019-10-04 LAB — CBC WITH DIFFERENTIAL/PLATELET
Abs Immature Granulocytes: 0.06 10*3/uL (ref 0.00–0.07)
Basophils Absolute: 0 10*3/uL (ref 0.0–0.1)
Basophils Relative: 0 %
Eosinophils Absolute: 0 10*3/uL (ref 0.0–0.5)
Eosinophils Relative: 0 %
HCT: 37.1 % (ref 36.0–46.0)
Hemoglobin: 11.8 g/dL — ABNORMAL LOW (ref 12.0–15.0)
Immature Granulocytes: 1 %
Lymphocytes Relative: 4 %
Lymphs Abs: 0.5 10*3/uL — ABNORMAL LOW (ref 0.7–4.0)
MCH: 30.1 pg (ref 26.0–34.0)
MCHC: 31.8 g/dL (ref 30.0–36.0)
MCV: 94.6 fL (ref 80.0–100.0)
Monocytes Absolute: 0.3 10*3/uL (ref 0.1–1.0)
Monocytes Relative: 3 %
Neutro Abs: 11 10*3/uL — ABNORMAL HIGH (ref 1.7–7.7)
Neutrophils Relative %: 92 %
Platelets: 364 10*3/uL (ref 150–400)
RBC: 3.92 MIL/uL (ref 3.87–5.11)
RDW: 13.5 % (ref 11.5–15.5)
WBC: 11.9 10*3/uL — ABNORMAL HIGH (ref 4.0–10.5)
nRBC: 0 % (ref 0.0–0.2)

## 2019-10-04 LAB — VITAMIN B12: Vitamin B-12: 484 pg/mL (ref 180–914)

## 2019-10-04 LAB — TSH: TSH: 3.028 u[IU]/mL (ref 0.350–4.500)

## 2019-10-04 LAB — RESPIRATORY PANEL BY RT PCR (FLU A&B, COVID)
Influenza A by PCR: NEGATIVE
Influenza B by PCR: NEGATIVE
SARS Coronavirus 2 by RT PCR: NEGATIVE

## 2019-10-04 LAB — LACTIC ACID, PLASMA: Lactic Acid, Venous: 1.9 mmol/L (ref 0.5–1.9)

## 2019-10-04 LAB — AMMONIA: Ammonia: 10 umol/L (ref 9–35)

## 2019-10-04 LAB — TROPONIN I (HIGH SENSITIVITY)
Troponin I (High Sensitivity): 21 ng/L — ABNORMAL HIGH (ref <18)
Troponin I (High Sensitivity): 30 ng/L — ABNORMAL HIGH (ref <18)

## 2019-10-04 LAB — SARS CORONAVIRUS 2 (TAT 6-24 HRS): SARS Coronavirus 2: NEGATIVE

## 2019-10-04 LAB — BRAIN NATRIURETIC PEPTIDE: B Natriuretic Peptide: 295.1 pg/mL — ABNORMAL HIGH (ref 0.0–100.0)

## 2019-10-04 MED ORDER — FLUTICASONE PROPIONATE 50 MCG/ACT NA SUSP
2.0000 | Freq: Every day | NASAL | Status: DC | PRN
  Filled 2019-10-04: qty 16

## 2019-10-04 MED ORDER — ENOXAPARIN SODIUM 30 MG/0.3ML ~~LOC~~ SOLN
30.0000 mg | SUBCUTANEOUS | Status: DC
  Administered 2019-10-04 – 2019-10-07 (×4): 30 mg via SUBCUTANEOUS
  Filled 2019-10-04 (×4): qty 0.3

## 2019-10-04 MED ORDER — FERROUS SULFATE 325 (65 FE) MG PO TABS
325.0000 mg | ORAL_TABLET | Freq: Two times a day (BID) | ORAL | Status: DC
  Administered 2019-10-04 – 2019-10-07 (×7): 325 mg via ORAL
  Filled 2019-10-04 (×7): qty 1

## 2019-10-04 MED ORDER — AMIODARONE HCL 200 MG PO TABS
200.0000 mg | ORAL_TABLET | Freq: Every day | ORAL | Status: DC
  Administered 2019-10-04 – 2019-10-07 (×4): 200 mg via ORAL
  Filled 2019-10-04 (×4): qty 1

## 2019-10-04 MED ORDER — ACETAMINOPHEN 325 MG PO TABS
650.0000 mg | ORAL_TABLET | Freq: Four times a day (QID) | ORAL | Status: DC | PRN
  Administered 2019-10-04: 05:00:00 650 mg via ORAL
  Filled 2019-10-04: qty 2

## 2019-10-04 MED ORDER — ACETAMINOPHEN 650 MG RE SUPP: 650.0000 mg | Freq: Four times a day (QID) | RECTAL | Status: DC | PRN

## 2019-10-04 MED ORDER — PANTOPRAZOLE SODIUM 20 MG PO TBEC
20.0000 mg | DELAYED_RELEASE_TABLET | Freq: Every day | ORAL | Status: DC
  Administered 2019-10-04 – 2019-10-07 (×4): 20 mg via ORAL
  Filled 2019-10-04 (×4): qty 1

## 2019-10-04 MED ORDER — ASPIRIN EC 81 MG PO TBEC
81.0000 mg | DELAYED_RELEASE_TABLET | Freq: Every day | ORAL | Status: DC
  Administered 2019-10-04 – 2019-10-07 (×4): 81 mg via ORAL
  Filled 2019-10-04 (×4): qty 1

## 2019-10-04 MED ORDER — LEVOTHYROXINE SODIUM 75 MCG PO TABS
75.0000 ug | ORAL_TABLET | Freq: Every day | ORAL | Status: DC
  Administered 2019-10-04 – 2019-10-07 (×4): 75 ug via ORAL
  Filled 2019-10-04 (×4): qty 1

## 2019-10-04 NOTE — Progress Notes (Addendum)
Progress Note  Patient Name: Deborah [name] Date of Encounter: 10/04/2019  Primary Cardiologist: Glenetta Hew, MD   Subjective   Currently feeling well.  No chest pain or shortness of breath.  Inpatient Medications    Scheduled Meds: . aspirin EC  81 mg Oral Daily  . enoxaparin (LOVENOX) injection  30 mg Subcutaneous Q24H  . ferrous sulfate  325 mg Oral BID WC  . levothyroxine  75 mcg Oral Q0600  . pantoprazole  20 mg Oral Daily   Continuous Infusions:  PRN Meds: acetaminophen **OR** acetaminophen, fluticasone   Vital Signs    Vitals:   10/04/19 0230 10/04/19 0308 10/04/19 0311 10/04/19 0720  BP: (!) 124/49 (!) 132/34 (!) 131/51 (!) 123/56  Pulse: 61 63 64 64  Resp: 18 18 17 13   Temp:   (!) 97.3 F (36.3 C) (!) 97.3 F (36.3 C)  TempSrc:   Oral Oral  SpO2: 100% 100% 100% 100%  Weight:      Height:   5' (1.524 m)     Intake/Output Summary (Last 24 hours) at 10/04/2019 0914 Last data filed at 10/04/2019 0847 Gross per 24 hour  Intake 1500 ml  Output 100 ml  Net 1400 ml   Last 3 Weights 10/03/2019 09/25/2019 09/10/2019  Weight (lbs) 110 lb 117 lb 113 lb  Weight (kg) 49.896 kg 53.071 kg 51.256 kg      Telemetry    Sinus rhythm- Personally Reviewed  ECG    Sinus rhythm, first-degree AV block- Personally Reviewed  Physical Exam   GEN: No acute distress.   Neck: No JVD Cardiac: RRR, no murmurs, rubs, or gallops.  Respiratory: Clear to auscultation bilaterally. GI: Soft, nontender, non-distended  MS: No edema; No deformity. Neuro:  Nonfocal  Psych: Normal affect   Labs    High Sensitivity Troponin:   Recent Labs  Lab 10/03/19 2024 10/04/19 0019 10/04/19 0616  TROPONINIHS 10 21* 30*      Chemistry Recent Labs  Lab 10/03/19 2024  NA 133*  K 3.9  CL 96*  CO2 25  GLUCOSE 145*  BUN 12  CREATININE 0.75  CALCIUM 9.0  GFRNONAA >60  GFRAA >60  ANIONGAP 12     Hematology Recent Labs  Lab 10/04/19 0019  WBC 11.9*  RBC 3.92  HGB 11.8*   HCT 37.1  MCV 94.6  MCH 30.1  MCHC 31.8  RDW 13.5  PLT 364    BNP Recent Labs  Lab 10/04/19 0019  BNP 295.1*     DDimer No results for input(s): DDIMER in the last 168 hours.   Radiology    CT Head Wo Contrast  Result Date: 10/03/2019 CLINICAL DATA:  Dizziness and weakness for 1 hour EXAM: CT HEAD WITHOUT CONTRAST TECHNIQUE: Contiguous axial images were obtained from the base of the skull through the vertex without intravenous contrast. COMPARISON:  07/02/2019 FINDINGS: Brain: No evidence of acute infarction, hemorrhage, hydrocephalus, extra-axial collection or mass lesion/mass effect. Diffuse atrophic and chronic white matter ischemic changes are again identified. Vascular: No hyperdense vessel or unexpected calcification. Skull: No focal fracture is noted. Heterogeneous attenuation of the skull is again identified similar to that noted on the prior exam. Sinuses/Orbits: No acute finding. Other: None. IMPRESSION: Chronic atrophic and ischemic changes stable from the prior exam. No acute abnormality noted. Electronically Signed   By: Inez Catalina M.D.   On: 10/03/2019 23:35   DG Chest Port 1 View  Result Date: 10/03/2019 CLINICAL DATA:  Syncope. Dizziness and weakness.  EXAM: PORTABLE CHEST 1 VIEW COMPARISON:  Radiograph 12/30/2017 FINDINGS: Significant patient rotation. Low lung volumes, similar to prior exam. Mild cardiomegaly is grossly unchanged allowing for rotation. Aortic atherosclerosis. Implanted loop recorder in the left chest wall. No acute airspace disease, pulmonary edema, pneumothorax or large pleural effusion. Remote left clavicle fracture. Remote right rib fracture. IMPRESSION: Low lung volumes without acute abnormality. Aortic Atherosclerosis (ICD10-I70.0). Electronically Signed   By: Narda Rutherford M.D.   On: 10/03/2019 21:42   CT Angio Chest/Abd/Pel for Dissection W and/or Wo Contrast  Result Date: 10/03/2019 CLINICAL DATA:  Chest pain or back pain, aortic  dissection suspected EXAM: CT ANGIOGRAPHY CHEST, ABDOMEN AND PELVIS TECHNIQUE: Multidetector CT imaging through the chest, abdomen and pelvis was performed using the standard protocol during bolus administration of intravenous contrast. Multiplanar reconstructed images and MIPs were obtained and reviewed to evaluate the vascular anatomy. CONTRAST:  53mL OMNIPAQUE IOHEXOL 350 MG/ML SOLN. 100 cc Omnipaque 350 IV extravasated in the left upper extremity, evaluated by non interpreting radiologist. COMPARISON:  Radiograph earlier this day. FINDINGS: CTA CHEST FINDINGS Cardiovascular: Thoracic aorta is normal in caliber without dissection, aortic hematoma, or aneurysm. Moderate aortic atherosclerosis. The left subclavian artery is occluded at its origin, reconstituted approximately 2 cm distally. No pulmonary embolus to the lobar level. Heart is normal in size. There are coronary artery calcifications. No pericardial effusion. Mediastinum/Nodes: Small mediastinal and hilar lymph nodes. No pathologic adenopathy. No esophageal wall thickening. No visualized thyroid nodule. Lungs/Pleura: 14 mm serpiginous tubular structure in the anterior right upper lobe, series 11 image 60, likely a small AVM. There is an additional tubular serpiginous lesion in the right lower lobe measuring approximately 2 cm, also likely a small AVM. No confluent airspace disease. No evidence of pulmonary edema. No pleural fluid. Musculoskeletal: Extravasated IV contrast within the musculature of the left upper extremity. Severe compression fracture of T9 and T11, technically age indeterminate but likely chronic. Remote sternal fracture. Implanted loop recorder in the left chest wall. Remote posterior right rib fracture. Review of the MIP images confirms the above findings. CTA ABDOMEN AND PELVIS FINDINGS VASCULAR Aorta: Moderate atherosclerosis. No dissection, aneurysm, or evidence of vasculitis. Celiac: Splenic and hepatic arteries with separate  origins from the aorta. Plaque at the origin of the common hepatic artery causes mild stenosis with poststenotic dilatation. SMA: Plaque at the origin but no significant stenosis. No dissection or evidence of vasculitis. Distal branch vessels are patent. Renals: Plaque at the origin of both renal arteries, left greater than right. No dissection or aneurysm. No evidence of vasculitis. IMA: Patent. Inflow: Patent without aneurysm, dissection, vasculitis or severe stenosis. Moderate atherosclerosis. Veins: No obvious venous abnormality within the limitations of this arterial phase study. Review of the MIP images confirms the above findings. NON-VASCULAR Hepatobiliary: Minimal contrast refluxes into the hepatic veins and IVC. No focal hepatic lesion. Gallbladder is unremarkable. No biliary dilatation. Pancreas: No ductal dilatation or inflammation. Spleen: Normal in size and arterial enhancement. Adrenals/Urinary Tract: Mild left adrenal thickening without dominant nodule. Normal right adrenal gland. No hydronephrosis. No perinephric edema. Mild cortical scarring in the upper left kidney. Small bilateral renal cysts. Early excretion of IV contrast in both renal collecting systems likely from extravasated contrast material. Excreted IV contrast in the urinary bladder which is otherwise unremarkable. Stomach/Bowel: Stomach is nondistended. No small bowel wall thickening, inflammation or obstruction. Equivocal mild wall thickening of the ascending colon without pericolonic edema. Colonic diverticulosis without evidence of diverticulitis. Lymphatic: No adenopathy. Reproductive: Status post  hysterectomy. No adnexal masses. Other: No free air, free fluid, or intra-abdominal fluid collection. Musculoskeletal: Mild compression fracture of L1 and L2, moderate compression fracture of L4. These are technically age indeterminate but likely chronic. Bones are diffusely under mineralized. Review of the MIP images confirms the above  findings. IMPRESSION: CTA CHEST 1. No aortic dissection or acute aortic abnormality. Moderate aortic atherosclerosis. 2. 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 3. Probable small pulmonary AVM's in the right upper and right lower lobes (1.4 cm in 2.0 cm respectively). No prior exams to evaluate for comparison. Consider follow-up chest CT with contrast in 6 months to evaluate for stability. 4. Coronary artery calcifications. 5. Equivocal mild wall thickening of the ascending colon without pericolonic edema. This may be secondary to colitis. 6. Compression fractures of T9, T11, L1, L2, and L4, technically age indeterminate but likely chronic. 7. Colonic diverticulosis without diverticulitis. 8. IV contrast extravasation in the left upper extremity. Aortic Atherosclerosis (ICD10-I70.0). Electronically Signed   By: Narda Rutherford M.D.   On: 10/03/2019 23:49    Cardiac Studies   TTE 08/08/17 - Left ventricle: The cavity size was normal. Wall thickness was   increased in a pattern of moderate LVH. Systolic function was   normal. The estimated ejection fraction was in the range of 60%   to 65%. The study is not technically sufficient to allow   evaluation of LV diastolic function. - Aortic valve: Trileaflet. Sclerosis without stenosis. There was   mild regurgitation. - Mitral valve: Posterior calcified annulus. Mildly thickened   leaflets, dilated annulus. There was moderate to severe   regurgitation. Effective regurgitant orifice (PISA): 0.26 cm^2.   Regurgitant volume (PISA): 53 ml. - Left atrium: Severely dilated. - Right atrium: The atrium was mildly dilated. - Atrial septum: No defect or patent foramen ovale was identified. - Tricuspid valve: There was moderate regurgitation. - Pulmonary arteries: PA peak pressure: 42 mm Hg (S). - Systemic veins: The IVC measures <2.1 cm, but does not collapse   >50%, suggesting an elevated RA pressure  of 8 mmHg.  Patient Profile     86 y.o. female with history of dementia, tachybradycardia syndrome, paroxysmal atrial fibrillation, hypothyroidism, coronary artery disease presented to the hospital after an episode of syncope.  Assessment & Plan    1.  Tachybradycardia syndrome: Start to be tachycardic yesterday the emergency room with heart rates close to 140 bpm.  She did convert to sinus rhythm with a short 2 to 3-second pauses reportedly, and had some hypotension requiring phenylephrine.  At this point, her husband has declined pacemaker implant.  Would work for continued control of her atrial fibrillation.  She has not been anticoagulated and Charlene Cowdrey continue to hold pending conversation with her husband.  Plan to start her on amiodarone today at a low dose to see if this Nehemias Sauceda keep her in normal rhythm and prevent her episodes.  We Kentarius Partington continue supportive care at this time as she remains in sinus rhythm.      For questions or updates, please contact [facility] Please consult [email or web address] for contact info under        Signed, Naresh Althaus Jorja Loa, MD  10/04/2019, 9:14 AM

## 2019-10-04 NOTE — ED Notes (Signed)
Lab draw attempted x2 with no success.

## 2019-10-04 NOTE — ED Provider Notes (Signed)
H/O a-fib and sick sinus No pacemaker, DNR A-fib with rVR 140 to 80 HR prior to Cardizem, became hypotensive 82/57 Cardiology consulted and has seen the patient for paroxysmal a-fib Husband reports sxs of increased fatigue at home - getting evaluated for infection Labs pending - difficulty in collection due to clotting Dissection study, head CT, CXR - negative CM infiltration with arm swelling - neurovascularly intact  Adm: Has been going in and out of a-fib, needs telemetry monitoring after labs resulted. Cardiology to follow inpatient BP stable currently  12:50 - BP 125/63, HR 61 Patient stable Waiting on admission  Discussed admission with Dr. Loney Loh who accepts the patient on to her service.    Elpidio Anis, PA-C 10/04/19 [id number]    Pricilla Loveless, MD 10/06/19 [phone]

## 2019-10-04 NOTE — ED Notes (Signed)
Phlebotomy to obtain overdue labs, RNx2, NTx1 unsuccessful

## 2019-10-04 NOTE — H&P (Addendum)
History and Physical    Deborah [name] BSJ:[id number] DOB: 1931-07-16 DOA: 10/03/2019  PCP: Shirline Frees, NP Patient coming from: Home  Chief Complaint: Dizziness, syncope  HPI: Deborah [name] is a 86 y.o. female with medical history significant of paroxysmal A. fib, sick sinus syndrome, CAD status post PCI, chronic diastolic congestive heart failure, hypertension, hyperlipidemia, hypothyroidism, vasovagal syncope, vertigo presenting to the ED for evaluation of dizziness and syncope.  Patient is oriented to self only and confused.  Reports feeling dizzy.  Denies chest pain or shortness of breath.  She was brought to the ED by her husband for evaluation of dizziness.  It is reported that the patient went to use the bathroom before dinner and had a syncopal episode, her son caught her and she did not fall to the ground or strike her head.  Family reported that patient has not been feeling well for the past few days and has had intermittent episodes of dizziness and has been very fatigued and sleeping, she has had a poor appetite.  ED Course: Found to be in A. fib with RVR.  Afebrile.  WBC count 11.9.  Lactic acid normal.  Hemoglobin 11.8, previously in the 11-12 range.  High-sensitivity troponin 10 >21.  BNP 295.  Magnesium normal.  TSH normal.  SARS-CoV-2 PCR test negative.  Blood culture x2 pending.  Chest x-ray showing no active cardiopulmonary abnormality.  CT angiogram chest/abdomen/pelvis negative for aortic dissection.  Head CT negative for acute intracranial abnormality. In the ED, Cardizem was ordered but before patient could receive a she suddenly became hypotensive with systolic in the 70s and her heart rate dropped from 140s to 80s.  She was pale, diaphoretic, and complained of dizziness and feeling like she was going to pass out.  She was given 1.5 L fluid boluses and phenylephrine push dose which caused improvement of her blood pressures.  Husband told cardiology that patient is DNR and he does  not want any transcutaneous pacing or pacemaker. Patient also had contrast extravasation of the left upper extremity with swelling and erythema but distal pulses intact.  Warm compresses applied.  Review of Systems:  All systems reviewed and apart from history of presenting illness, are negative.  Past Medical History:  Diagnosis Date  . Anxiety   . Bradycardia   . CAD S/P percutaneous coronary angioplasty 11/2014   DES PCI to RCA. Cath was done for pre-operative evaluation for mitral repair; LM 30%, LAD 50%, RCA 90% (Promus DES)  . 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 Normal EF by echo.  . Diverticulitis   . Essential hypertension   . H/O: upper GI bleed 12/03/2014   While on ASA/Plavix & Warfarin --> Hct dropped to 18%; small AVM noted but no overt pathology.  [name] History of uterine cancer 2001   Status post hysterectomy  . Hyperlipidemia with target LDL less than 70    Coronary disease and thoracic aortic atheroma  . Hypothyroidism    On Levothyroxine  . Paroxysmal atrial fibrillation (HCC) 2014   Associated with sick sinus syndrome. -- Intolerant of beta blockers and diltiazem. Rate control with digoxin. Not on anticoagulation despite CHA2DS2Vasc 6  2/2 prior severe GI bleed on triple therapy  . PFO (patent foramen ovale)    Noted on TEE - L-R shunt (elsewhere reported it as a ASD)  . Severe mitral regurgitation by prior echocardiogram 09/2014   Seen on TEE to have severe MR  with multiple jets. Vena contracted 0.8; Consideration had been ? Mitral E-Clip - put on hold after GI Bleed.  . Sick sinus syndrome ([place])    Status post Loop Recorder: [employer] Linq -- most recent interrogation 05/19/2016: 2 new pauses. One greater than 3 seconds at 0 320 the morning. Second was 4.4 seconds at 9:56 PM; also noted to have an episode of A. fib. --> Recommended further follow-up upon arrival to [facility].  . Thoracic  aortic atherosclerosis ([place]) 09/2014   Moderate grade 3-4 atheroma noted on TEE  . Urinary tract infection   . Vasovagal syncope    Exacerbated by bradycardia, sick sinus syndrome, and mitral regurgitation.  . Vertigo, central, unspecified laterality    Chronic dizziness.    Past Surgical History:  Procedure Laterality Date  . ABDOMINAL HYSTERECTOMY  2001  . Cataract Surgery  Bilateral   . COLONOSCOPY W/ BIOPSIES  2006   2 polyps noted along with diverticulitis  . CORONARY ANGIOPLASTY WITH STENT PLACEMENT  11/2014   [facility]: 90% RCA - PCI with Promus DES.  [name] implanted cardiac monitor     . LOOP RECORDER INSERTION  07/24/2013   Medtronic Linq - to evaluate SSS for symptomatic bradycardia  . Right and Left Heart Catheterization  11/2014   RHC: RAP 4, RVP 32/4, PA peak 29/7/16. LVEDP 18.  [facility]: 30% LM, 50% LAD, 90% RCA --> PCI (Promus DES)  . TEE WITHOUT CARDIOVERSION  09/2014   Normal LV function. Severe MR with multiple jets. Vena contractile 0.8.  PFO with L-R shunt; moderate grade 3-4 aortic atheroma  . TONSILLECTOMY    . TRANSTHORACIC ECHOCARDIOGRAM  07/2017   EF 60 to 65%.  Moderate-severe MR.  Moderate TR with elevated RVSP  . TRANSTHORACIC ECHOCARDIOGRAM  08/2014   Normal LV function with biatrial enlargement. Moderate-severe mitral and tricuspid insufficiency.     reports that she quit smoking about 49 years ago. Her smoking use included cigarettes. She started smoking about 55 years ago. She has a 2.50 pack-year smoking history. She has never used smokeless tobacco. She reports current alcohol use. She reports that she does not use drugs.  Allergies  Allergen Reactions  . Carvedilol Other (See Comments)    Bradycardia   . Diltiazem Other (See Comments)    Bradycardia  . Lopressor [Metoprolol] Other (See Comments)    Bradycardia    Family History  Problem Relation Age of Onset  . Stomach cancer Father   . Cancer Brother     . Diabetes Brother   . Cancer Daughter     Prior to Admission medications   Medication Sig Start Date End Date Taking? Authorizing Provider  acetaminophen (TYLENOL) 325 MG tablet Take 650 mg by mouth at bedtime. daily    Yes [provider]  ALPRAZolam (XANAX) 0.25 MG tablet Take 1 tablet (0.25 mg total) by mouth at bedtime as needed for anxiety. 08/01/19  Yes Nafziger, Tommi Rumps, NP  aspirin EC 81 MG tablet Take 81 mg by mouth daily.   Yes [provider]  Carboxymethylcellul-Glycerin (REFRESH OPTIVE OP) Place 1 drop into both eyes daily as needed (dry eyes).   Yes [provider]  ferrous sulfate 325 (65 FE) MG tablet Take 325 mg by mouth 2 (two) times daily with a meal.    Yes [provider]  fluticasone (FLONASE) 50 MCG/ACT nasal spray Place 2 sprays into both nostrils daily as needed for allergies or rhinitis.  Yes [provider]  furosemide (LASIX) 20 MG tablet TAKE 1 TABLET (20 MG)  BY MOUTH EVERY OTHER DAY AND MAY TAKE AN ADDITIONAL 20 MG ON THE ODD DAYS IF NEEDED. 01/13/19  Yes Marykay Lex, MD  levothyroxine (SYNTHROID) 75 MCG tablet TAKE 1 TABLET BY MOUTH ONCE A DAY BEFORE BREAKFAST 07/31/19  Yes Nafziger, Kandee Keen, NP  meclizine (ANTIVERT) 25 MG tablet Take 0.5-1 tablets (12.5-25 mg total) by mouth 3 (three) times daily as needed for dizziness. 09/10/18  Yes Nafziger, Kandee Keen, NP  omeprazole (PRILOSEC) 20 MG capsule Take 1 capsule (20 mg total) by mouth daily. 07/31/19  Yes Nafziger, Kandee Keen, NP  polyethylene glycol (MIRALAX / GLYCOLAX) packet Take 8.5 g by mouth daily as needed for moderate constipation. Mix 1/2 scoop (8.5 g) in 8 oz orange juice and drink daily   Yes [provider]  potassium chloride (KLOR-CON) 10 MEQ tablet Take 1 tablet (10 mEq total) by mouth 2 (two) times daily. 07/31/19  Yes Shirline Frees, NP    Physical Exam: Vitals:   10/04/19 0215 10/04/19 0230 10/04/19 0308 10/04/19 0311  BP: (!) 127/42 (!) 124/49 (!)  132/34 (!) 131/51  Pulse: 69 61 63 64  Resp: (!) 21 18 18 17   Temp:    (!) 97.3 F (36.3 C)  TempSrc:    Oral  SpO2: 98% 100% 100% 100%  Weight:      Height:    5' (1.524 m)    Physical Exam  HENT:  Head: Normocephalic.  Eyes: Right eye exhibits no discharge. Left eye exhibits no discharge.  Cardiovascular: Normal rate, regular rhythm and intact distal pulses.  Pulmonary/Chest: Effort normal and breath sounds normal. No respiratory distress. She has no wheezes. She has no rales.  Abdominal: Soft. Bowel sounds are normal. She exhibits no distension. There is no abdominal tenderness. There is no guarding.  Musculoskeletal:        General: No edema.     Cervical back: Neck supple.  Neurological: No cranial nerve deficit.  Awake and alert Appears confused Oriented to self only Following commands appropriately No focal motor or sensory deficit  Skin: Skin is warm and dry. She is not diaphoretic.     Labs on Admission: I have personally reviewed following labs and imaging studies  CBC: Recent Labs  Lab 10/04/19 0019  WBC 11.9*  NEUTROABS 11.0*  HGB 11.8*  HCT 37.1  MCV 94.6  PLT 364   Basic Metabolic Panel: Recent Labs  Lab 10/03/19 2024  NA 133*  K 3.9  CL 96*  CO2 25  GLUCOSE 145*  BUN 12  CREATININE 0.75  CALCIUM 9.0  MG 2.1   GFR: Estimated Creatinine Clearance: 36.3 mL/min (by C-G formula based on SCr of 0.75 mg/dL). Liver Function Tests: No results for input(s): AST, ALT, ALKPHOS, BILITOT, PROT, ALBUMIN in the last 168 hours. No results for input(s): LIPASE, AMYLASE in the last 168 hours. No results for input(s): AMMONIA in the last 168 hours. Coagulation Profile: No results for input(s): INR, PROTIME in the last 168 hours. Cardiac Enzymes: No results for input(s): CKTOTAL, CKMB, CKMBINDEX, TROPONINI in the last 168 hours. BNP (last 3 results) No results for input(s): PROBNP in the last 8760 hours. HbA1C: No results for input(s): HGBA1C in the  last 72 hours. CBG: No results for input(s): GLUCAP in the last 168 hours. Lipid Profile: No results for input(s): CHOL, HDL, LDLCALC, TRIG, CHOLHDL, LDLDIRECT in the last 72 hours. Thyroid Function Tests: Recent Labs  10/04/19 0019  TSH 3.028   Anemia Panel: No results for input(s): VITAMINB12, FOLATE, FERRITIN, TIBC, IRON, RETICCTPCT in the last 72 hours. Urine analysis:    Component Value Date/Time   COLORURINE YELLOW 07/02/2019 0628   APPEARANCEUR HAZY (A) 07/02/2019 0628   LABSPEC 1.009 07/02/2019 0628   PHURINE 7.0 07/02/2019 0628   GLUCOSEU NEGATIVE 07/02/2019 0628   HGBUR SMALL (A) 07/02/2019 0628   BILIRUBINUR NEGATIVE 07/02/2019 0628   KETONESUR NEGATIVE 07/02/2019 0628   PROTEINUR NEGATIVE 07/02/2019 0628   NITRITE NEGATIVE 07/02/2019 0628   LEUKOCYTESUR MODERATE (A) 07/02/2019 0628    Radiological Exams on Admission: CT Head Wo Contrast  Result Date: 10/03/2019 CLINICAL DATA:  Dizziness and weakness for 1 hour EXAM: CT HEAD WITHOUT CONTRAST TECHNIQUE: Contiguous axial images were obtained from the base of the skull through the vertex without intravenous contrast. COMPARISON:  07/02/2019 FINDINGS: Brain: No evidence of acute infarction, hemorrhage, hydrocephalus, extra-axial collection or mass lesion/mass effect. Diffuse atrophic and chronic white matter ischemic changes are again identified. Vascular: No hyperdense vessel or unexpected calcification. Skull: No focal fracture is noted. Heterogeneous attenuation of the skull is again identified similar to that noted on the prior exam. Sinuses/Orbits: No acute finding. Other: None. IMPRESSION: Chronic atrophic and ischemic changes stable from the prior exam. No acute abnormality noted. Electronically Signed   By: Alcide Clever M.D.   On: 10/03/2019 23:35   DG Chest Port 1 View  Result Date: 10/03/2019 CLINICAL DATA:  Syncope. Dizziness and weakness. EXAM: PORTABLE CHEST 1 VIEW COMPARISON:  Radiograph 12/30/2017 FINDINGS:  Significant patient rotation. Low lung volumes, similar to prior exam. Mild cardiomegaly is grossly unchanged allowing for rotation. Aortic atherosclerosis. Implanted loop recorder in the left chest wall. No acute airspace disease, pulmonary edema, pneumothorax or large pleural effusion. Remote left clavicle fracture. Remote right rib fracture. IMPRESSION: Low lung volumes without acute abnormality. Aortic Atherosclerosis (ICD10-I70.0). Electronically Signed   By: Narda Rutherford M.D.   On: 10/03/2019 21:42   CT Angio Chest/Abd/Pel for Dissection W and/or Wo Contrast  Result Date: 10/03/2019 CLINICAL DATA:  Chest pain or back pain, aortic dissection suspected EXAM: CT ANGIOGRAPHY CHEST, ABDOMEN AND PELVIS TECHNIQUE: Multidetector CT imaging through the chest, abdomen and pelvis was performed using the standard protocol during bolus administration of intravenous contrast. Multiplanar reconstructed images and MIPs were obtained and reviewed to evaluate the vascular anatomy. CONTRAST:  12mL OMNIPAQUE IOHEXOL 350 MG/ML SOLN. 100 cc Omnipaque 350 IV extravasated in the left upper extremity, evaluated by non interpreting radiologist. COMPARISON:  Radiograph earlier this day. FINDINGS: CTA CHEST FINDINGS Cardiovascular: Thoracic aorta is normal in caliber without dissection, aortic hematoma, or aneurysm. Moderate aortic atherosclerosis. The left subclavian artery is occluded at its origin, reconstituted approximately 2 cm distally. No pulmonary embolus to the lobar level. Heart is normal in size. There are coronary artery calcifications. No pericardial effusion. Mediastinum/Nodes: Small mediastinal and hilar lymph nodes. No pathologic adenopathy. No esophageal wall thickening. No visualized thyroid nodule. Lungs/Pleura: 14 mm serpiginous tubular structure in the anterior right upper lobe, series 11 image 60, likely a small AVM. There is an additional tubular serpiginous lesion in the right lower lobe measuring  approximately 2 cm, also likely a small AVM. No confluent airspace disease. No evidence of pulmonary edema. No pleural fluid. Musculoskeletal: Extravasated IV contrast within the musculature of the left upper extremity. Severe compression fracture of T9 and T11, technically age indeterminate but likely chronic. Remote sternal fracture. Implanted loop recorder in the left  chest wall. Remote posterior right rib fracture. Review of the MIP images confirms the above findings. CTA ABDOMEN AND PELVIS FINDINGS VASCULAR Aorta: Moderate atherosclerosis. No dissection, aneurysm, or evidence of vasculitis. Celiac: Splenic and hepatic arteries with separate origins from the aorta. Plaque at the origin of the common hepatic artery causes mild stenosis with poststenotic dilatation. SMA: Plaque at the origin but no significant stenosis. No dissection or evidence of vasculitis. Distal branch vessels are patent. Renals: Plaque at the origin of both renal arteries, left greater than right. No dissection or aneurysm. No evidence of vasculitis. IMA: Patent. Inflow: Patent without aneurysm, dissection, vasculitis or severe stenosis. Moderate atherosclerosis. Veins: No obvious venous abnormality within the limitations of this arterial phase study. Review of the MIP images confirms the above findings. NON-VASCULAR Hepatobiliary: Minimal contrast refluxes into the hepatic veins and IVC. No focal hepatic lesion. Gallbladder is unremarkable. No biliary dilatation. Pancreas: No ductal dilatation or inflammation. Spleen: Normal in size and arterial enhancement. Adrenals/Urinary Tract: Mild left adrenal thickening without dominant nodule. Normal right adrenal gland. No hydronephrosis. No perinephric edema. Mild cortical scarring in the upper left kidney. Small bilateral renal cysts. Early excretion of IV contrast in both renal collecting systems likely from extravasated contrast material. Excreted IV contrast in the urinary bladder which is  otherwise unremarkable. Stomach/Bowel: Stomach is nondistended. No small bowel wall thickening, inflammation or obstruction. Equivocal mild wall thickening of the ascending colon without pericolonic edema. Colonic diverticulosis without evidence of diverticulitis. Lymphatic: No adenopathy. Reproductive: Status post hysterectomy. No adnexal masses. Other: No free air, free fluid, or intra-abdominal fluid collection. Musculoskeletal: Mild compression fracture of L1 and L2, moderate compression fracture of L4. These are technically age indeterminate but likely chronic. Bones are diffusely under mineralized. Review of the MIP images confirms the above findings. IMPRESSION: CTA CHEST 1. No aortic dissection or acute aortic abnormality. Moderate aortic atherosclerosis. 2. 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 3. Probable small pulmonary AVM's in the right upper and right lower lobes (1.4 cm in 2.0 cm respectively). No prior exams to evaluate for comparison. Consider follow-up chest CT with contrast in 6 months to evaluate for stability. 4. Coronary artery calcifications. 5. Equivocal mild wall thickening of the ascending colon without pericolonic edema. This may be secondary to colitis. 6. Compression fractures of T9, T11, L1, L2, and L4, technically age indeterminate but likely chronic. 7. Colonic diverticulosis without diverticulitis. 8. IV contrast extravasation in the left upper extremity. Aortic Atherosclerosis (ICD10-I70.0). Electronically Signed   By: Narda RutherfordMelanie  Sanford M.D.   On: 10/03/2019 23:49    EKG: Independently reviewed.  Initial EKG with A. Fib.  Repeat EKG sinus rhythm.  Assessment/Plan Principal Problem:   Syncope Active Problems:   Irregular heart rate   Hypotension   Generalized weakness   AMS (altered mental status)   Syncope, hypotension, irregular heart rate Patient was seen by cardiology and her presentation is thought to be  due to her longstanding history of A. fib and tachybrady syndrome.  No significant pauses noted on telemetry or severe bradycardia.  TSH normal.  CT angiogram chest/abdomen/pelvis negative for aortic dissection. Husband told cardiology that patient is DNR and they would not want external pacing, transvenous pacing, or permanent pacemaker placement.   -Cardiac monitoring -Cardiology recommending avoiding AV nodal blocking agents -Patient and her husband are not interested in any kind of pacemaker or invasive measures -Cardiology following -Rule out underlying infection as a cause for hypotension  Generalized weakness/fatigue Afebrile.  No significant leukocytosis.  Lactic acid normal.  Chest x-ray not suggestive of pneumonia.  SARS-CoV-2 PCR test negative. -UA pending -Blood culture x2 pending -PT evaluation  Altered mental status Patient is oriented to self only and appears confused.  Neuro exam nonfocal.  Head CT negative for acute intracranial abnormality.  Unclear whether she has baseline dementia.  TSH normal. -UA pending -Check B12 and ammonia levels  Troponinemia Likely secondary to demand ischemia, presented with A. fib with RVR.  High-sensitivity troponin 10 >21.  Patient denies chest pain. -Cardiac monitoring -Trend troponin  Abnormal CT finding CT with equivocal mild wall thickening of the ascending colon without pericolonic edema.  Colitis less likely given no significant leukocytosis.  Family did not report diarrhea.  Abdominal exam benign.  Left subclavian artery occlusion CT angiogram showing occlusion of the left subclavian artery at its origin, reconstituted approximately 2 cm distally. -Doppler for further evaluation  Probable small pulmonary AVMs CT showing probable small pulmonary AVM's in the right upper and right lower lobes (1.4 cm in 2.0 cm respectively). No prior exams to evaluate for comparison.  -Consider follow-up chest CT with contrast in 6 months to  evaluate for stability.  Hypothyroidism TSH normal -Continue Synthroid  DVT prophylaxis: Lovenox Code Status: DNR Family Communication: No family available at this time. Disposition Plan: Anticipate discharge after clinical improvement. Consults called: Cardiology Admission status: It is my clinical opinion that referral for OBSERVATION is reasonable and necessary in this patient based on the above information provided. The aforementioned taken together are felt to place the patient at high risk for further clinical deterioration. However it is anticipated that the patient may be medically stable for discharge from the hospital within 24 to 48 hours.  The medical decision making on this patient was of high complexity and the patient is at high risk for clinical deterioration, therefore this is a level 3 visit.  John GiovanniVasundhra Nahsir Venezia MD [facility] Pager [phone]  If 7PM-7AM, please contact night-coverage [email or web address] Password [email or web address]  10/04/2019, 6:35 AM

## 2019-10-04 NOTE — Evaluation (Signed)
Physical Therapy Evaluation Patient Details Name: Deborah [name] MRN: [id number] DOB: 1931-05-31 Today's Date: 10/04/2019   History of Present Illness  86 y.o. female with medical history significant of paroxysmal A. fib, sick sinus syndrome, CAD status post PCI, chronic diastolic congestive heart failure, hypertension, hyperlipidemia, hypothyroidism, vasovagal syncope, vertigo presenting to the ED for evaluation of dizziness and syncope.  Clinical Impression  Pt presents to PT with deficits in functional mobility, gait, balance, endurance, strength, power. Pt is significantly limited by reports of dizziness and feeling as though she will pass out, reporting at rest and with limited mobility. VSS remain stable despite reports. Pt with impaired cognition noted as well, increasing potential falls risk during OOB activity. Pt will benefit from further acute PT services to reduce falls risk and improve tolerance for OOB activity. PT recommending SNF at this time, but if pt dizziness subsides or is less significant the pt may be able to progress quickly to a home level of discharge.    Follow Up Recommendations SNF;Supervision/Assistance - 24 hour(pt may progress to home if not limited by dizziness)    Equipment Recommendations  Rolling walker with 5" wheels    Recommendations for Other Services OT consult     Precautions / Restrictions Precautions Precautions: Fall Restrictions Weight Bearing Restrictions: No      Mobility  Bed Mobility Overal bed mobility: Needs Assistance Bed Mobility: Supine to Sit;Sit to Supine     Supine to sit: Min assist Sit to supine: Min assist      Transfers Overall transfer level: (deferred OOB mobility due to pt reporting she may pass out)                  Ambulation/Gait                Stairs            Wheelchair Mobility    Modified Rankin (Stroke Patients Only)       Balance Overall balance assessment: Needs  assistance Sitting-balance support: Bilateral upper extremity supported;Feet supported Sitting balance-Leahy Scale: Poor Sitting balance - Comments: minA sitting edge of bed Postural control: Posterior lean                                   Pertinent Vitals/Pain Pain Assessment: Faces Faces Pain Scale: Hurts even more Pain Location: back Pain Descriptors / Indicators: Grimacing Pain Intervention(s): Limited activity within patient's tolerance    Home Living Family/patient expects to be discharged to:: Private residence Living Arrangements: Spouse/significant other Available Help at Discharge: Family;Available 24 hours/day Type of Home: House Home Access: Stairs to enter Entrance Stairs-Rails: Can reach both Entrance Stairs-Number of Steps: 4 Home Layout: One level Home Equipment: Cane - single point;Grab bars - toilet;Grab bars - tub/shower      Prior Function Level of Independence: Independent with assistive device(s)         Comments: short distances with use of cane     Hand Dominance        Extremity/Trunk Assessment   Upper Extremity Assessment Upper Extremity Assessment: Generalized weakness    Lower Extremity Assessment Lower Extremity Assessment: Generalized weakness(pt with resting tremor intermittently in whole body)    Cervical / Trunk Assessment Cervical / Trunk Assessment: Kyphotic  Communication   Communication: Expressive difficulties  Cognition Arousal/Alertness: Awake/alert(waxing and waning at times) Behavior During Therapy: Restless;Anxious Overall Cognitive Status: Impaired/Different from baseline Area of Impairment:  Orientation;Attention;Memory;Following commands;Safety/judgement;Problem solving                 Orientation Level: Disoriented to;Time;Situation Current Attention Level: Alternating Memory: Decreased recall of precautions;Decreased short-term memory Following Commands: Follows one step commands with  increased time Safety/Judgement: Decreased awareness of safety;Decreased awareness of deficits   Problem Solving: Slow processing;Requires verbal cues;Requires tactile cues        General Comments General comments (skin integrity, edema, etc.): VSS, HR remaining from 60-80, brief run of A-fib prior to mobility but none noted during. Pt with some noted hypertension BP of 160/57 after mobilizing, but was at 134/67 pre-mobility. Pt limited by reports of dizziness and feeling as though she will pass out, reporting 3 separate instances of this at rest and during mobility this session    Exercises     Assessment/Plan    PT Assessment Patient needs continued PT services  PT Problem List Decreased strength;Decreased activity tolerance;Decreased balance;Decreased mobility;Decreased cognition;Decreased knowledge of use of DME;Decreased knowledge of precautions;Decreased safety awareness;Cardiopulmonary status limiting activity;Pain       PT Treatment Interventions DME instruction;Gait training;Stair training;Functional mobility training;Therapeutic activities;Therapeutic exercise;Balance training;Neuromuscular re-education;Patient/family education    PT Goals (Current goals can be found in the Care Plan section)  Acute Rehab PT Goals Patient Stated Goal: To reduce dizziness PT Goal Formulation: With patient/family Time For Goal Achievement: 10/18/19 Potential to Achieve Goals: Fair    Frequency Min 3X/week   Barriers to discharge        Co-evaluation               AM-PAC PT "6 Clicks" Mobility  Outcome Measure Help needed turning from your back to your side while in a flat bed without using bedrails?: A Little Help needed moving from lying on your back to sitting on the side of a flat bed without using bedrails?: A Little Help needed moving to and from a bed to a chair (including a wheelchair)?: A Lot Help needed standing up from a chair using your arms (e.g., wheelchair or  bedside chair)?: A Lot Help needed to walk in hospital room?: A Lot Help needed climbing 3-5 steps with a railing? : Total 6 Click Score: 13    End of Session Equipment Utilized During Treatment: (none) Activity Tolerance: Treatment limited secondary to medical complications (Comment) Patient left: in bed;with call bell/phone within reach;with bed alarm set;with family/visitor present Nurse Communication: Mobility status PT Visit Diagnosis: Muscle weakness (generalized) (M62.81);Other abnormalities of gait and mobility (R26.89)    Time: 3716-9678 PT Time Calculation (min) (ACUTE ONLY): 23 min   Charges:   PT Evaluation $PT Eval Moderate Complexity: 1 Mod          Zenaida Niece, PT, DPT Acute Rehabilitation Pager: [phone]   Zenaida Niece 10/04/2019, 12:05 PM

## 2019-10-04 NOTE — Progress Notes (Signed)
PROGRESS NOTE  Deborah [name] XNA:[id number] DOB: May 12, 1931 DOA: 10/03/2019 PCP: Dorothyann Peng, NP  HPI/Recap of past 24 hours:  Deborah [name] is a 86 y.o. female with medical history significant of paroxysmal A. fib, sick sinus syndrome, CAD status post PCI, chronic diastolic congestive heart failure, hypertension, hyperlipidemia, hypothyroidism, vasovagal syncope, vertigo presenting to the ED for evaluation of dizziness and syncope.   Subjective: Patient seen and examined at bedside husband who is at bedside stated that she had the sudden onset of anxiety and vertigo that comes on suddenly.  She said that physical therapy came to help her as soon as they said the word to get up she went into what seems like a panic episode patient stated she was not afraid as she is not scared or afraid to get up but something just happened and caused her to get into a panic mode.  She has dementia and history of generalized anxiety disorder she has had a bad week the husband said but is particularly got worse yesterday patient was admitted for syncope and cardiology has been consulted and the diagnosed her with tachybradycardia syndrome.  He stated that her husband refused or declined pacemaker at this time so they will continue rate control with amiodarone that will be started today.  They will be following  Assessment/Plan: Principal Problem:   Syncope Active Problems:   Irregular heart rate   Hypotension   Generalized weakness   AMS (altered mental status)   Atrial fibrillation with RVR (HCC)  #1 tachybradycardia syndrome patient started on amiodarone cardiology will continue to discuss with her husband concerning possible placing pacemaker   #2 syncope likely secondary to tachybradycardia syndrome  3.  Paroxysmal atrial fibrillation with rapid ventricular rate  4.  Possible generalized anxiety disorder with panic probably she might be reacting to the tachybradycardia syndrome with a panic symptom  because of the way it makes her feel.  Not quite sure if I need to start her on any antianxiety medicine will complete the cardiac evaluation and treatment Code Status: DNR  Severity of Illness: The appropriate patient status for this patient is INPATIENT. Inpatient status is judged to be reasonable and necessary in order to provide the required intensity of service to ensure the patient's safety. The patient's presenting symptoms, physical exam findings, and initial radiographic and laboratory data in the context of their chronic comorbidities is felt to place them at high risk for further clinical deterioration. Furthermore, it is not anticipated that the patient will be medically stable for discharge from the hospital within 2 midnights of admission. The following factors support the patient status of inpatient.   " The patient's presenting symptoms include syncope. " The worrisome physical exam findings include tachybradycardia syndrome. " The initial radiographic and laboratory data are worrisome because of work-up still in progress. " The chronic co-morbidities include dementia.   * I certify that at the point of admission it is my clinical judgment that the patient will require inpatient hospital care spanning beyond 2 midnights from the point of admission due to high intensity of service, high risk for further deterioration and high frequency of surveillance required.*    Family Communication: HUSBAND AUTHUR AT BEDSIDE  Disposition Plan: Possibly home   Consultants:  Cardiology  Procedures:  NONE  Antimicrobials:  *NONE  DVT prophylaxis:  [place]   Objective: Vitals:   10/04/19 0720 10/04/19 1023 10/04/19 1032 10/04/19 1455  BP: (!) 123/56 (!) 136/56 (!) 146/51 (!) 154/69  Pulse: 64  66 68 71  Resp: 13 18 17 17   Temp: (!) 97.3 F (36.3 C)  (!) 97.2 F (36.2 C) 97.8 F (36.6 C)  TempSrc: Oral  Oral Oral  SpO2: 100% 99% 100% 100%  Weight:      Height:         Intake/Output Summary (Last 24 hours) at 10/04/2019 1845 Last data filed at 10/04/2019 1756 Gross per 24 hour  Intake 1860 ml  Output 325 ml  Net 1535 ml   Filed Weights   10/03/19 2012  Weight: 49.9 kg   Body mass index is 21.48 kg/m.  Exam:  . General: 86 y.o. year-old female well developed well nourished in no acute distress.  Alert and oriented x3 patient was able to speak and communicate.  Chronically ill looking frail . Cardiovascular: Regular rate and rhythm with no rubs or gallops.  No thyromegaly or JVD noted.   88 Respiratory: Clear to auscultation with no wheezes or rales. Good inspiratory effort. . Abdomen: Soft nontender nondistended with normal bowel sounds x4 quadrants. . Musculoskeletal: No lower extremity edema. 2/4 pulses in all 4 extremities. . Skin: No ulcerative lesions noted or rashes, . Psychiatry: Mood is appropriate for condition and setting a bit anxious    Data Reviewed: CBC: Recent Labs  Lab 10/04/19 0019  WBC 11.9*  NEUTROABS 11.0*  HGB 11.8*  HCT 37.1  MCV 94.6  PLT 364   Basic Metabolic Panel: Recent Labs  Lab 10/03/19 2024  NA 133*  K 3.9  CL 96*  CO2 25  GLUCOSE 145*  BUN 12  CREATININE 0.75  CALCIUM 9.0  MG 2.1   GFR: Estimated Creatinine Clearance: 36.3 mL/min (by C-G formula based on SCr of 0.75 mg/dL). Liver Function Tests: No results for input(s): AST, ALT, ALKPHOS, BILITOT, PROT, ALBUMIN in the last 168 hours. No results for input(s): LIPASE, AMYLASE in the last 168 hours. Recent Labs  Lab 10/04/19 0648  AMMONIA 10   Coagulation Profile: No results for input(s): INR, PROTIME in the last 168 hours. Cardiac Enzymes: No results for input(s): CKTOTAL, CKMB, CKMBINDEX, TROPONINI in the last 168 hours. BNP (last 3 results) No results for input(s): PROBNP in the last 8760 hours. HbA1C: No results for input(s): HGBA1C in the last 72 hours. CBG: No results for input(s): GLUCAP in the last 168 hours. Lipid  Profile: No results for input(s): CHOL, HDL, LDLCALC, TRIG, CHOLHDL, LDLDIRECT in the last 72 hours. Thyroid Function Tests: Recent Labs    10/04/19 0019  TSH 3.028   Anemia Panel: Recent Labs    10/04/19 0648  VITAMINB12 484   Urine analysis:    Component Value Date/Time   COLORURINE YELLOW 07/02/2019 0628   APPEARANCEUR HAZY (A) 07/02/2019 0628   LABSPEC 1.009 07/02/2019 0628   PHURINE 7.0 07/02/2019 0628   GLUCOSEU NEGATIVE 07/02/2019 0628   HGBUR SMALL (A) 07/02/2019 0628   BILIRUBINUR NEGATIVE 07/02/2019 0628   KETONESUR NEGATIVE 07/02/2019 0628   PROTEINUR NEGATIVE 07/02/2019 0628   NITRITE NEGATIVE 07/02/2019 0628   LEUKOCYTESUR MODERATE (A) 07/02/2019 0628   Sepsis Labs: @LABRCNTIP (procalcitonin:4,lacticidven:4)  ) Recent Results (from the past 240 hour(s))  SARS CORONAVIRUS 2 (TAT 6-24 HRS) Nasopharyngeal Nasopharyngeal Swab     Status: None   Collection Time: 10/03/19  8:53 PM   Specimen: Nasopharyngeal Swab  Result Value Ref Range Status   SARS Coronavirus 2 NEGATIVE NEGATIVE Final    Comment: (NOTE) SARS-CoV-2 target nucleic acids are NOT DETECTED. The SARS-CoV-2 RNA is generally  detectable in upper and lower respiratory specimens during the acute phase of infection. Negative results do not preclude SARS-CoV-2 infection, do not rule out co-infections with other pathogens, and should not be used as the sole basis for treatment or other patient management decisions. Negative results must be combined with clinical observations, patient history, and epidemiological information. The expected result is Negative. Fact Sheet for Patients: HairSlick.nohttps://www.fda.gov/media/138098/download Fact Sheet for Healthcare Providers: quierodirigir.comhttps://www.fda.gov/media/138095/download This test is not yet approved or cleared by the [place] FDA and  has been authorized for detection and/or diagnosis of SARS-CoV-2 by FDA under an Emergency Use Authorization (EUA). This EUA will  remain  in effect (meaning this test can be used) for the duration of the COVID-19 declaration under Section 56 4(b)(1) of the Act, 21 U.S.C. section 360bbb-3(b)(1), unless the authorization is terminated or revoked sooner. Performed at [facility], [address] [place], [place] [address]   Respiratory Panel by RT PCR (Flu A&B, Covid) - Nasopharyngeal Swab     Status: None   Collection Time: 10/04/19  1:00 AM   Specimen: Nasopharyngeal Swab  Result Value Ref Range Status   SARS Coronavirus 2 by RT PCR NEGATIVE NEGATIVE Final    Comment: (NOTE) SARS-CoV-2 target nucleic acids are NOT DETECTED. The SARS-CoV-2 RNA is generally detectable in upper respiratoy specimens during the acute phase of infection. The lowest concentration of SARS-CoV-2 viral copies this assay can detect is 131 copies/mL. A negative result does not preclude SARS-Cov-2 infection and should not be used as the sole basis for treatment or other patient management decisions. A negative result may occur with  improper specimen collection/handling, submission of specimen other than nasopharyngeal swab, presence of viral mutation(s) within the areas targeted by this assay, and inadequate number of viral copies (<131 copies/mL). A negative result must be combined with clinical observations, patient history, and epidemiological information. The expected result is Negative. Fact Sheet for Patients:  https://www.moore.com/https://www.fda.gov/media/142436/download Fact Sheet for Healthcare Providers:  https://www.young.biz/https://www.fda.gov/media/142435/download This test is not yet ap proved or cleared by the [place] FDA and  has been authorized for detection and/or diagnosis of SARS-CoV-2 by FDA under an Emergency Use Authorization (EUA). This EUA will remain  in effect (meaning this test can be used) for the duration of the COVID-19 declaration under Section 564(b)(1) of the Act, 21 U.S.C. section 360bbb-3(b)(1), unless the authorization is  terminated or revoked sooner.    Influenza A by PCR NEGATIVE NEGATIVE Final   Influenza B by PCR NEGATIVE NEGATIVE Final    Comment: (NOTE) The Xpert Xpress SARS-CoV-2/FLU/RSV assay is intended as an aid in  the diagnosis of influenza from Nasopharyngeal swab specimens and  should not be used as a sole basis for treatment. Nasal washings and  aspirates are unacceptable for Xpert Xpress SARS-CoV-2/FLU/RSV  testing. Fact Sheet for Patients: https://www.moore.com/https://www.fda.gov/media/142436/download Fact Sheet for Healthcare Providers: https://www.young.biz/https://www.fda.gov/media/142435/download This test is not yet approved or cleared by the [place] FDA and  has been authorized for detection and/or diagnosis of SARS-CoV-2 by  FDA under an Emergency Use Authorization (EUA). This EUA will remain  in effect (meaning this test can be used) for the duration of the  Covid-19 declaration under Section 564(b)(1) of the Act, 21  U.S.C. section 360bbb-3(b)(1), unless the authorization is  terminated or revoked. Performed at [facility], [address] [place], [place] [address]       Studies: CT Head Wo Contrast  Result Date: 10/03/2019 CLINICAL DATA:  Dizziness and weakness  for 1 hour EXAM: CT HEAD WITHOUT CONTRAST TECHNIQUE: Contiguous axial images were obtained from the base of the skull through the vertex without intravenous contrast. COMPARISON:  07/02/2019 FINDINGS: Brain: No evidence of acute infarction, hemorrhage, hydrocephalus, extra-axial collection or mass lesion/mass effect. Diffuse atrophic and chronic white matter ischemic changes are again identified. Vascular: No hyperdense vessel or unexpected calcification. Skull: No focal fracture is noted. Heterogeneous attenuation of the skull is again identified similar to that noted on the prior exam. Sinuses/Orbits: No acute finding. Other: None. IMPRESSION: Chronic atrophic and ischemic changes stable from the prior exam. No acute abnormality noted.  Electronically Signed   By: Alcide Clever M.D.   On: 10/03/2019 23:35   DG Chest Port 1 View  Result Date: 10/03/2019 CLINICAL DATA:  Syncope. Dizziness and weakness. EXAM: PORTABLE CHEST 1 VIEW COMPARISON:  Radiograph 12/30/2017 FINDINGS: Significant patient rotation. Low lung volumes, similar to prior exam. Mild cardiomegaly is grossly unchanged allowing for rotation. Aortic atherosclerosis. Implanted loop recorder in the left chest wall. No acute airspace disease, pulmonary edema, pneumothorax or large pleural effusion. Remote left clavicle fracture. Remote right rib fracture. IMPRESSION: Low lung volumes without acute abnormality. Aortic Atherosclerosis (ICD10-I70.0). Electronically Signed   By: Narda Rutherford M.D.   On: 10/03/2019 21:42   CT Angio Chest/Abd/Pel for Dissection W and/or Wo Contrast  Result Date: 10/03/2019 CLINICAL DATA:  Chest pain or back pain, aortic dissection suspected EXAM: CT ANGIOGRAPHY CHEST, ABDOMEN AND PELVIS TECHNIQUE: Multidetector CT imaging through the chest, abdomen and pelvis was performed using the standard protocol during bolus administration of intravenous contrast. Multiplanar reconstructed images and MIPs were obtained and reviewed to evaluate the vascular anatomy. CONTRAST:  26mL OMNIPAQUE IOHEXOL 350 MG/ML SOLN. 100 cc Omnipaque 350 IV extravasated in the left upper extremity, evaluated by non interpreting radiologist. COMPARISON:  Radiograph earlier this day. FINDINGS: CTA CHEST FINDINGS Cardiovascular: Thoracic aorta is normal in caliber without dissection, aortic hematoma, or aneurysm. Moderate aortic atherosclerosis. The left subclavian artery is occluded at its origin, reconstituted approximately 2 cm distally. No pulmonary embolus to the lobar level. Heart is normal in size. There are coronary artery calcifications. No pericardial effusion. Mediastinum/Nodes: Small mediastinal and hilar lymph nodes. No pathologic adenopathy. No esophageal wall thickening. No  visualized thyroid nodule. Lungs/Pleura: 14 mm serpiginous tubular structure in the anterior right upper lobe, series 11 image 60, likely a small AVM. There is an additional tubular serpiginous lesion in the right lower lobe measuring approximately 2 cm, also likely a small AVM. No confluent airspace disease. No evidence of pulmonary edema. No pleural fluid. Musculoskeletal: Extravasated IV contrast within the musculature of the left upper extremity. Severe compression fracture of T9 and T11, technically age indeterminate but likely chronic. Remote sternal fracture. Implanted loop recorder in the left chest wall. Remote posterior right rib fracture. Review of the MIP images confirms the above findings. CTA ABDOMEN AND PELVIS FINDINGS VASCULAR Aorta: Moderate atherosclerosis. No dissection, aneurysm, or evidence of vasculitis. Celiac: Splenic and hepatic arteries with separate origins from the aorta. Plaque at the origin of the common hepatic artery causes mild stenosis with poststenotic dilatation. SMA: Plaque at the origin but no significant stenosis. No dissection or evidence of vasculitis. Distal branch vessels are patent. Renals: Plaque at the origin of both renal arteries, left greater than right. No dissection or aneurysm. No evidence of vasculitis. IMA: Patent. Inflow: Patent without aneurysm, dissection, vasculitis or severe stenosis. Moderate atherosclerosis. Veins: No obvious venous abnormality within the limitations of this  arterial phase study. Review of the MIP images confirms the above findings. NON-VASCULAR Hepatobiliary: Minimal contrast refluxes into the hepatic veins and IVC. No focal hepatic lesion. Gallbladder is unremarkable. No biliary dilatation. Pancreas: No ductal dilatation or inflammation. Spleen: Normal in size and arterial enhancement. Adrenals/Urinary Tract: Mild left adrenal thickening without dominant nodule. Normal right adrenal gland. No hydronephrosis. No perinephric edema. Mild  cortical scarring in the upper left kidney. Small bilateral renal cysts. Early excretion of IV contrast in both renal collecting systems likely from extravasated contrast material. Excreted IV contrast in the urinary bladder which is otherwise unremarkable. Stomach/Bowel: Stomach is nondistended. No small bowel wall thickening, inflammation or obstruction. Equivocal mild wall thickening of the ascending colon without pericolonic edema. Colonic diverticulosis without evidence of diverticulitis. Lymphatic: No adenopathy. Reproductive: Status post hysterectomy. No adnexal masses. Other: No free air, free fluid, or intra-abdominal fluid collection. Musculoskeletal: Mild compression fracture of L1 and L2, moderate compression fracture of L4. These are technically age indeterminate but likely chronic. Bones are diffusely under mineralized. Review of the MIP images confirms the above findings. IMPRESSION: CTA CHEST 1. No aortic dissection or acute aortic abnormality. Moderate aortic atherosclerosis. 2. 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 3. Probable small pulmonary AVM's in the right upper and right lower lobes (1.4 cm in 2.0 cm respectively). No prior exams to evaluate for comparison. Consider follow-up chest CT with contrast in 6 months to evaluate for stability. 4. Coronary artery calcifications. 5. Equivocal mild wall thickening of the ascending colon without pericolonic edema. This may be secondary to colitis. 6. Compression fractures of T9, T11, L1, L2, and L4, technically age indeterminate but likely chronic. 7. Colonic diverticulosis without diverticulitis. 8. IV contrast extravasation in the left upper extremity. Aortic Atherosclerosis (ICD10-I70.0). Electronically Signed   By: Narda Rutherford M.D.   On: 10/03/2019 23:49   VAS US CAROTID  Result Date: 10/04/2019 Carotid Arterial Duplex Study Indications:       Syncope and Dizziness. Risk Factors:       Left subclavian artery stenosis at origin by CTA.                    Hypertension, hyperlipidemia, coronary artery disease. Other Factors:     Paroxysmal atrial fibrillation, sick sinus syndrome/. Comparison Study:  No prior study on file for comparison Performing Technologist: Sherren Kerns RVS  Examination Guidelines: A complete evaluation includes B-mode imaging, spectral Doppler, color Doppler, and power Doppler as needed of all accessible portions of each vessel. Bilateral testing is considered an integral part of a complete examination. Limited examinations for reoccurring indications may be performed as noted.  Right Carotid Findings: +----------+--------+--------+--------+------------------+------------------+           PSV cm/sEDV cm/sStenosisPlaque DescriptionComments           +----------+--------+--------+--------+------------------+------------------+ CCA Prox  88      8                                 intimal thickening +----------+--------+--------+--------+------------------+------------------+ CCA Distal72      12                                intimal thickening +----------+--------+--------+--------+------------------+------------------+ ICA Prox  113     24              heterogenous                         +----------+--------+--------+--------+------------------+------------------+  ICA Distal118     21                                tortuous           +----------+--------+--------+--------+------------------+------------------+ ECA       95      6                                                    +----------+--------+--------+--------+------------------+------------------+ +----------+--------+-------+--------+-------------------+           PSV cm/sEDV cmsDescribeArm Pressure (mmHG) +----------+--------+-------+--------+-------------------+ Subclavian170                                         +----------+--------+-------+--------+-------------------+ +---------+--------+--+--------+--+ VertebralPSV cm/s83EDV cm/s28 +---------+--------+--+--------+--+  Left Carotid Findings: +----------+--------+--------+--------+------------------+------------------+           PSV cm/sEDV cm/sStenosisPlaque DescriptionComments           +----------+--------+--------+--------+------------------+------------------+ CCA Prox  83      14                                intimal thickening +----------+--------+--------+--------+------------------+------------------+ CCA Distal79      14                                intimal thickening +----------+--------+--------+--------+------------------+------------------+ ICA Prox  143     29              calcific          Shadowing          +----------+--------+--------+--------+------------------+------------------+ ICA Distal123     20                                tortuous           +----------+--------+--------+--------+------------------+------------------+ ECA       96      8                                                    +----------+--------+--------+--------+------------------+------------------+ +----------+--------+--------+--------+-------------------+           PSV cm/sEDV cm/sDescribeArm Pressure (mmHG) +----------+--------+--------+--------+-------------------+ [id number]                                          +----------+--------+--------+--------+-------------------+ +---------+--------+--+--------+-+ VertebralPSV cm/s85EDV cm/s4 +---------+--------+--+--------+-+  Summary: Right Carotid: Velocities in the right ICA are consistent with a 1-39% stenosis. Left Carotid: Velocities in the left ICA are consistent with a 1-39% stenosis. Vertebrals:  Bilateral vertebral arteries demonstrate antegrade flow. Subclavians: Normal flow hemodynamics were seen in bilateral subclavian              arteries. *See  table(s) above for measurements and observations.     Preliminary     Scheduled Meds: . amiodarone  200 mg Oral Daily  . aspirin EC  81 mg Oral Daily  . enoxaparin (LOVENOX) injection  30 mg Subcutaneous Q24H  . ferrous sulfate  325 mg Oral BID WC  . levothyroxine  75 mcg Oral Q0600  . pantoprazole  20 mg Oral Daily    Continuous Infusions:   LOS: 0 days     Myrtie Neither, MD [facility]  To reach me or the doctor on call, go to: [email or web address] Password [email or web address]  10/04/2019, 6:45 PM

## 2019-10-05 DIAGNOSIS — I4891 Unspecified atrial fibrillation: Secondary | ICD-10-CM

## 2019-10-05 LAB — URINALYSIS, ROUTINE W REFLEX MICROSCOPIC
Bacteria, UA: NONE SEEN
Bilirubin Urine: NEGATIVE
Glucose, UA: NEGATIVE mg/dL
Hgb urine dipstick: NEGATIVE
Ketones, ur: NEGATIVE mg/dL
Nitrite: NEGATIVE
Protein, ur: NEGATIVE mg/dL
Specific Gravity, Urine: 1.039 — ABNORMAL HIGH (ref 1.005–1.030)
pH: 5 (ref 5.0–8.0)

## 2019-10-05 MED ORDER — SODIUM CHLORIDE 0.9 % IV SOLN: INTRAVENOUS | Status: DC

## 2019-10-05 NOTE — Progress Notes (Addendum)
PROGRESS NOTE  Deborah [name] WUJ:[id number] DOB: Sep 21, 1930 DOA: 10/03/2019 PCP: Shirline Frees, NP  HPI/Recap of past 24 hours:  Deborah [name] is a 86 y.o. female with medical history significant of paroxysmal A. fib, sick sinus syndrome, CAD status post PCI, chronic diastolic congestive heart failure, hypertension, hyperlipidemia, hypothyroidism, vasovagal syncope, vertigo presenting to the ED for evaluation of dizziness and syncope.   Subjective: Patient seen and examined at bedside husband who is at bedside stated that she had the sudden onset of anxiety and vertigo that comes on suddenly.  She said that physical therapy came to help her as soon as they said the word to get up she went into what seems like a panic episode patient stated she was not afraid as she is not scared or afraid to get up but something just happened and caused her to get into a panic mode.  She has dementia and history of generalized anxiety disorder she has had a bad week the husband said but is particularly got worse yesterday patient was admitted for syncope and cardiology has been consulted and the diagnosed her with tachybradycardia syndrome.  He stated that her husband refused or declined pacemaker at this time so they will continue rate control with amiodarone that will be started today.  They will be following  October 05, 2019: Subjective: Patient seen and examined at bedside ,she is sitting in the recliner  at  her bedside .she says she is doing well.  Her husband is in the room and complains of a left elbow/forearm that is still swollen from IV contrast infiltration on the day of admission.  Patient denies any pain there but there is an obvious diffuse swelling around the elbow.  Dad originally applied some warm compresses but it still has not gone down  Assessment/Plan: Principal Problem:   Syncope Active Problems:   Irregular heart rate   Hypotension   Generalized weakness   AMS (altered mental status)  Atrial fibrillation with RVR (HCC)  #1 tachybradycardia syndrome patient started on amiodarone cardiology will continue to discuss with her husband concerning possible placing pacemaker   #2 syncope likely secondary to tachybradycardia syndrome  3.  Paroxysmal atrial fibrillation with rapid ventricular rate  4.  Possible generalized anxiety disorder with panic probably she might be reacting to the tachybradycardia syndrome with a panic symptom because of the way it makes her feel.  Not quite sure if I need to start her on any antianxiety medicine will complete the cardiac evaluation and treatment  5.  Abnormal urinalysis with haziness and increased blood count but no bacteria.  No nitrites so this is likely pyuria with bacteria and patient is asymptomatic with no fever as a result I have not treated it  5.  Slightly dehydrated with specific gravity of 1.39 she will be encouraged to increase fluid Code Status: DNR  Severity of Illness: The appropriate patient status for this patient is INPATIENT. Inpatient status is judged to be reasonable and necessary in order to provide the required intensity of service to ensure the patient's safety. The patient's presenting symptoms, physical exam findings, and initial radiographic and laboratory data in the context of their chronic comorbidities is felt to place them at high risk for further clinical deterioration. Furthermore, it is not anticipated that the patient will be medically stable for discharge from the hospital within 2 midnights of admission. The following factors support the patient status of inpatient.   " The patient's presenting symptoms include syncope. "  The worrisome physical exam findings include tachybradycardia syndrome. " The initial radiographic and laboratory data are worrisome because of work-up still in progress. Patient slightly dehydrated so I have started her on IV fluid   " The chronic co-morbidities include  dementia.   * I certify that at the point of admission it is my clinical judgment that the patient will require inpatient hospital care spanning beyond 2 midnights from the point of admission due to high intensity of service, high risk for further deterioration and high frequency of surveillance required.*    Family Communication: HUSBAND AUTHUR AT BEDSIDE  Disposition Plan: Discharged home  tomorrow Patient was waiting for OT evaluation which was done later in the day Also repeat UA in the morning if is still abnormal may need to start her on p.o. antibiotics for home.  Patient slightly dehydrated so I have started her on IV fluid   Consultants:  Cardiology  Procedures:  NONE  Antimicrobials:  *NONE  DVT prophylaxis:  SDC   Objective: Vitals:   10/05/19 0330 10/05/19 0857 10/05/19 0858 10/05/19 1155  BP: (!) 147/60 (!) 98/58  (!) 159/63  Pulse: 86 73  74  Resp: 20 18 16 20   Temp: 97.9 F (36.6 C)   97.9 F (36.6 C)  TempSrc: Oral   Oral  SpO2: 100% 100%  98%  Weight: 49.8 kg     Height:        Intake/Output Summary (Last 24 hours) at 10/05/2019 1257 Last data filed at 10/05/2019 0800 Gross per 24 hour  Intake 360 ml  Output 575 ml  Net -215 ml   Filed Weights   10/03/19 2012 10/05/19 0330  Weight: 49.9 kg 49.8 kg   Body mass index is 21.44 kg/m.  Exam:  . General: 86 y.o. year-old female well developed well nourished in no acute distress.  Alert and oriented x3 patient was able to speak and communicate.  Chronically ill looking frail . Cardiovascular: Regular rate and rhythm with no rubs or gallops.  No thyromegaly or JVD noted.   88 Respiratory: Clear to auscultation with no wheezes or rales. Good inspiratory effort. . Abdomen: Soft nontender nondistended with normal bowel sounds x4 quadrants. . Musculoskeletal: No lower extremity edema. 2/4 pulses in all 4 extremities.  Mild swelling of the left elbow/upper forearm . Skin: No ulcerative lesions noted  or rashes, . Psychiatry: Mood is appropriate for condition and setting a bit anxious    Data Reviewed: CBC: Recent Labs  Lab 10/04/19 0019  WBC 11.9*  NEUTROABS 11.0*  HGB 11.8*  HCT 37.1  MCV 94.6  PLT 364   Basic Metabolic Panel: Recent Labs  Lab 10/03/19 2024  NA 133*  K 3.9  CL 96*  CO2 25  GLUCOSE 145*  BUN 12  CREATININE 0.75  CALCIUM 9.0  MG 2.1   GFR: Estimated Creatinine Clearance: 36.3 mL/min (by C-G formula based on SCr of 0.75 mg/dL). Liver Function Tests: No results for input(s): AST, ALT, ALKPHOS, BILITOT, PROT, ALBUMIN in the last 168 hours. No results for input(s): LIPASE, AMYLASE in the last 168 hours. Recent Labs  Lab 10/04/19 0648  AMMONIA 10   Coagulation Profile: No results for input(s): INR, PROTIME in the last 168 hours. Cardiac Enzymes: No results for input(s): CKTOTAL, CKMB, CKMBINDEX, TROPONINI in the last 168 hours. BNP (last 3 results) No results for input(s): PROBNP in the last 8760 hours. HbA1C: No results for input(s): HGBA1C in the last 72 hours. CBG: No results  for input(s): GLUCAP in the last 168 hours. Lipid Profile: No results for input(s): CHOL, HDL, LDLCALC, TRIG, CHOLHDL, LDLDIRECT in the last 72 hours. Thyroid Function Tests: Recent Labs    10/04/19 0019  TSH 3.028   Anemia Panel: Recent Labs    10/04/19 0648  VITAMINB12 484   Urine analysis:    Component Value Date/Time   COLORURINE YELLOW 10/05/2019 0615   APPEARANCEUR HAZY (A) 10/05/2019 0615   LABSPEC 1.039 (H) 10/05/2019 0615   PHURINE 5.0 10/05/2019 0615   GLUCOSEU NEGATIVE 10/05/2019 0615   HGBUR NEGATIVE 10/05/2019 0615   BILIRUBINUR NEGATIVE 10/05/2019 0615   KETONESUR NEGATIVE 10/05/2019 0615   PROTEINUR NEGATIVE 10/05/2019 0615   NITRITE NEGATIVE 10/05/2019 0615   LEUKOCYTESUR LARGE (A) 10/05/2019 0615   Sepsis Labs: @LABRCNTIP (procalcitonin:4,lacticidven:4)  ) Recent Results (from the past 240 hour(s))  SARS CORONAVIRUS 2 (TAT 6-24  HRS) Nasopharyngeal Nasopharyngeal Swab     Status: None   Collection Time: 10/03/19  8:53 PM   Specimen: Nasopharyngeal Swab  Result Value Ref Range Status   SARS Coronavirus 2 NEGATIVE NEGATIVE Final    Comment: (NOTE) SARS-CoV-2 target nucleic acids are NOT DETECTED. The SARS-CoV-2 RNA is generally detectable in upper and lower respiratory specimens during the acute phase of infection. Negative results do not preclude SARS-CoV-2 infection, do not rule out co-infections with other pathogens, and should not be used as the sole basis for treatment or other patient management decisions. Negative results must be combined with clinical observations, patient history, and epidemiological information. The expected result is Negative. Fact Sheet for Patients: 10/05/19 Fact Sheet for Healthcare Providers: HairSlick.no This test is not yet approved or cleared by the [employer] FDA and  has been authorized for detection and/or diagnosis of SARS-CoV-2 by FDA under an Emergency Use Authorization (EUA). This EUA will remain  in effect (meaning this test can be used) for the duration of the COVID-19 declaration under Section 56 4(b)(1) of the Act, 21 U.S.C. section 360bbb-3(b)(1), unless the authorization is terminated or revoked sooner. Performed at [facility], [address] [place], [place]   Culture, blood (routine x 2)     Status: None (Preliminary result)   Collection Time: 10/04/19 12:19 AM   Specimen: BLOOD  Result Value Ref Range Status   Specimen Description BLOOD RIGHT HAND  Final   Special Requests   Final    BOTTLES DRAWN AEROBIC AND ANAEROBIC Blood Culture results may not be optimal due to an inadequate volume of blood received in culture bottles   Culture   Final    NO GROWTH 1 DAY Performed at [facility], [address] [place], [place]    Report Status PENDING  Incomplete   Respiratory Panel by RT PCR (Flu A&B, Covid) - Nasopharyngeal Swab     Status: None   Collection Time: 10/04/19  1:00 AM   Specimen: Nasopharyngeal Swab  Result Value Ref Range Status   SARS Coronavirus 2 by RT PCR NEGATIVE NEGATIVE Final    Comment: (NOTE) SARS-CoV-2 target nucleic acids are NOT DETECTED. The SARS-CoV-2 RNA is generally detectable in upper respiratoy specimens during the acute phase of infection. The lowest concentration of SARS-CoV-2 viral copies this assay can detect is 131 copies/mL. A negative result does not preclude SARS-Cov-2 infection and should not be used as the sole basis for treatment or other patient management decisions. A negative result may occur with  improper specimen collection/handling, submission of specimen other  than nasopharyngeal swab, presence of viral mutation(s) within the areas targeted by this assay, and inadequate number of viral copies (<131 copies/mL). A negative result must be combined with clinical observations, patient history, and epidemiological information. The expected result is Negative. Fact Sheet for Patients:  PinkCheek.be Fact Sheet for Healthcare Providers:  GravelBags.it This test is not yet ap proved or cleared by the [place] FDA and  has been authorized for detection and/or diagnosis of SARS-CoV-2 by FDA under an Emergency Use Authorization (EUA). This EUA will remain  in effect (meaning this test can be used) for the duration of the COVID-19 declaration under Section 564(b)(1) of the Act, 21 U.S.C. section 360bbb-3(b)(1), unless the authorization is terminated or revoked sooner.    Influenza A by PCR NEGATIVE NEGATIVE Final   Influenza B by PCR NEGATIVE NEGATIVE Final    Comment: (NOTE) The Xpert Xpress SARS-CoV-2/FLU/RSV assay is intended as an aid in  the diagnosis of influenza from Nasopharyngeal swab specimens and  should not be used as a sole  basis for treatment. Nasal washings and  aspirates are unacceptable for Xpert Xpress SARS-CoV-2/FLU/RSV  testing. Fact Sheet for Patients: PinkCheek.be Fact Sheet for Healthcare Providers: GravelBags.it This test is not yet approved or cleared by the [place] FDA and  has been authorized for detection and/or diagnosis of SARS-CoV-2 by  FDA under an Emergency Use Authorization (EUA). This EUA will remain  in effect (meaning this test can be used) for the duration of the  Covid-19 declaration under Section 564(b)(1) of the Act, 21  U.S.C. section 360bbb-3(b)(1), unless the authorization is  terminated or revoked. Performed at [facility], [place] [address], [place], [place] [address]       Studies: No results found.  Scheduled Meds: . amiodarone  200 mg Oral Daily  . aspirin EC  81 mg Oral Daily  . enoxaparin (LOVENOX) injection  30 mg Subcutaneous Q24H  . ferrous sulfate  325 mg Oral BID WC  . levothyroxine  75 mcg Oral Q0600  . pantoprazole  20 mg Oral Daily    Continuous Infusions:   LOS: 1 day     Cristal Deer, MD [facility]  To reach me or the doctor on call, go to: [email or web address] Password [facility]  10/05/2019, 12:57 PM

## 2019-10-05 NOTE — Progress Notes (Signed)
Physical Therapy Treatment Patient Details Name: Deborah [name] MRN: [id number] DOB: 26-Jan-1931 Today's Date: 10/05/2019    History of Present Illness 86 y.o. female with medical history significant of paroxysmal A. fib, sick sinus syndrome, CAD status post PCI, chronic diastolic congestive heart failure, hypertension, hyperlipidemia, hypothyroidism, vasovagal syncope, vertigo presenting to the ED for evaluation of dizziness and syncope.    PT Comments    Pt progressing towards physical therapy goals today; no dizziness reported. Ambulating 75 feet with a cane at a min assist level. HR 73-94 bpm, BP 144/71 post mobility. Pt displays generalized weakness, balance impairments, and decreased cognition. Updated d/c plan in light of pt progress. Continue to recommend 24/7 supervision and assist and use of walker for mobility as pt presents as high fall risk.     Follow Up Recommendations  Supervision/Assistance - 24 hour;Home health PT     Equipment Recommendations  Rolling walker with 5" wheels    Recommendations for Other Services OT consult     Precautions / Restrictions Precautions Precautions: Fall Restrictions Weight Bearing Restrictions: No    Mobility  Bed Mobility Overal bed mobility: Needs Assistance Bed Mobility: Supine to Sit     Supine to sit: Supervision        Transfers Overall transfer level: Needs assistance Equipment used: Straight cane Transfers: Sit to/from Stand Sit to Stand: Min guard;Min assist         General transfer comment: Min guard from edge of bed, minA from low toilet height  Ambulation/Gait Ambulation/Gait assistance: Min guard;Min assist Gait Distance (Feet): 75 Feet Assistive device: Straight cane Gait Pattern/deviations: Step-through pattern;Decreased stride length;Trunk flexed;Step-to pattern;Shuffle Gait velocity: decreased Gait velocity interpretation: <1.31 ft/sec, indicative of household ambulator General Gait Details: One  episode initially of lateral loss of balance, requiring minA to correct. Tendency for downward gaze, short, shuffling step length, step to pattern initially and progressing to step through. Cues for direction and environmental negotiation   Stairs             Wheelchair Mobility    Modified Rankin (Stroke Patients Only)       Balance Overall balance assessment: Needs assistance Sitting-balance support: Feet supported Sitting balance-Leahy Scale: Fair     Standing balance support: Bilateral upper extremity supported Standing balance-Leahy Scale: Poor Standing balance comment: reliant on external support                            Cognition Arousal/Alertness: Awake/alert Behavior During Therapy: Restless;Anxious Overall Cognitive Status: Impaired/Different from baseline Area of Impairment: Memory;Following commands;Safety/judgement;Problem solving                     Memory: Decreased recall of precautions;Decreased short-term memory Following Commands: Follows one step commands with increased time;Follows one step commands inconsistently Safety/Judgement: Decreased awareness of safety;Decreased awareness of deficits   Problem Solving: Slow processing;Requires verbal cues;Requires tactile cues General Comments: Pt requiring cues for problem solving; way finding. Decreased awareness of safety/deficits. Exhibits anxious behaviors.      Exercises      General Comments        Pertinent Vitals/Pain Pain Assessment: Faces Faces Pain Scale: No hurt    Home Living Family/patient expects to be discharged to:: Private residence Living Arrangements: Spouse/significant other                  Prior Function            PT Goals (  current goals can now be found in the care plan section) Acute Rehab PT Goals Patient Stated Goal: go home PT Goal Formulation: With patient/family Time For Goal Achievement: 10/18/19 Potential to Achieve Goals:  Fair Progress towards PT goals: Progressing toward goals    Frequency    Min 3X/week      PT Plan Discharge plan needs to be updated    Co-evaluation              AM-PAC PT "6 Clicks" Mobility   Outcome Measure  Help needed turning from your back to your side while in a flat bed without using bedrails?: A Little Help needed moving from lying on your back to sitting on the side of a flat bed without using bedrails?: A Little Help needed moving to and from a bed to a chair (including a wheelchair)?: A Lot Help needed standing up from a chair using your arms (e.g., wheelchair or bedside chair)?: A Lot Help needed to walk in hospital room?: A Lot Help needed climbing 3-5 steps with a railing? : Total 6 Click Score: 13    End of Session Equipment Utilized During Treatment: Gait belt Activity Tolerance: Patient tolerated treatment well Patient left: with call bell/phone within reach;in chair;with chair alarm set Nurse Communication: Mobility status PT Visit Diagnosis: Muscle weakness (generalized) (M62.81);Other abnormalities of gait and mobility (R26.89)     Time: 6761-9509 PT Time Calculation (min) (ACUTE ONLY): 34 min  Charges:  $Gait Training: 8-22 mins $Therapeutic Activity: 8-22 mins                     Ellamae Sia, PT, DPT Acute Rehabilitation Services Pager [phone] Office [phone]    Willy Eddy 10/05/2019, 2:34 PM

## 2019-10-05 NOTE — Progress Notes (Addendum)
Progress Note  Patient Name: Deborah [name] Date of Encounter: 10/05/2019  Primary Cardiologist: Bryan Lemma, MD   Subjective   Remains in sinus rhythm.  Currently feeling well.  Inpatient Medications    Scheduled Meds:  amiodarone  200 mg Oral Daily   aspirin EC  81 mg Oral Daily   enoxaparin (LOVENOX) injection  30 mg Subcutaneous Q24H   ferrous sulfate  325 mg Oral BID WC   levothyroxine  75 mcg Oral Q0600   pantoprazole  20 mg Oral Daily   Continuous Infusions:  PRN Meds: acetaminophen **OR** acetaminophen, fluticasone   Vital Signs    Vitals:   10/04/19 1900 10/04/19 2000 10/05/19 0330 10/05/19 0857  BP: (!) 145/47  (!) 147/60 (!) 98/58  Pulse: 84 67 86 73  Resp: 18 18 20  (!) 21  Temp: 97.9 F (36.6 C)  97.9 F (36.6 C)   TempSrc: Oral  Oral   SpO2: 99% 96% 100% 100%  Weight:   49.8 kg   Height:        Intake/Output Summary (Last 24 hours) at 10/05/2019 0917 Last data filed at 10/05/2019 0800 Gross per 24 hour  Intake 360 ml  Output 575 ml  Net -215 ml   Last 3 Weights 10/05/2019 10/03/2019 09/25/2019  Weight (lbs) 109 lb 12.6 oz 110 lb 117 lb  Weight (kg) 49.8 kg 49.896 kg 53.071 kg      Telemetry    Sinus rhythm-personally reviewed  ECG    No new- Personally Reviewed  Physical Exam   GEN: Thin frail elderly woman in no acute distress HEENT: normal  Neck: no JVD, carotid bruits, or masses Cardiac: RRR; no murmurs, rubs, or gallops,no edema  Respiratory:  clear to auscultation bilaterally, normal work of breathing GI: soft, nontender, nondistended, + BS MS: no deformity or atrophy  Skin: warm and dry Neuro:  Strength and sensation are intact Psych: euthymic mood, full affect   Labs    High Sensitivity Troponin:   Recent Labs  Lab 10/03/19 2024 10/04/19 0019 10/04/19 0616  TROPONINIHS 10 21* 30*      Chemistry Recent Labs  Lab 10/03/19 2024  NA 133*  K 3.9  CL 96*  CO2 25  GLUCOSE 145*  BUN 12  CREATININE 0.75   CALCIUM 9.0  GFRNONAA >60  GFRAA >60  ANIONGAP 12     Hematology Recent Labs  Lab 10/04/19 0019  WBC 11.9*  RBC 3.92  HGB 11.8*  HCT 37.1  MCV 94.6  MCH 30.1  MCHC 31.8  RDW 13.5  PLT 364    BNP Recent Labs  Lab 10/04/19 0019  BNP 295.1*     DDimer No results for input(s): DDIMER in the last 168 hours.   Radiology    CT Head Wo Contrast  Result Date: 10/03/2019 CLINICAL DATA:  Dizziness and weakness for 1 hour EXAM: CT HEAD WITHOUT CONTRAST TECHNIQUE: Contiguous axial images were obtained from the base of the skull through the vertex without intravenous contrast. COMPARISON:  07/02/2019 FINDINGS: Brain: No evidence of acute infarction, hemorrhage, hydrocephalus, extra-axial collection or mass lesion/mass effect. Diffuse atrophic and chronic white matter ischemic changes are again identified. Vascular: No hyperdense vessel or unexpected calcification. Skull: No focal fracture is noted. Heterogeneous attenuation of the skull is again identified similar to that noted on the prior exam. Sinuses/Orbits: No acute finding. Other: None. IMPRESSION: Chronic atrophic and ischemic changes stable from the prior exam. No acute abnormality noted. Electronically Signed   By:  Alcide Clever M.D.   On: 10/03/2019 23:35   DG Chest Port 1 View  Result Date: 10/03/2019 CLINICAL DATA:  Syncope. Dizziness and weakness. EXAM: PORTABLE CHEST 1 VIEW COMPARISON:  Radiograph 12/30/2017 FINDINGS: Significant patient rotation. Low lung volumes, similar to prior exam. Mild cardiomegaly is grossly unchanged allowing for rotation. Aortic atherosclerosis. Implanted loop recorder in the left chest wall. No acute airspace disease, pulmonary edema, pneumothorax or large pleural effusion. Remote left clavicle fracture. Remote right rib fracture. IMPRESSION: Low lung volumes without acute abnormality. Aortic Atherosclerosis (ICD10-I70.0). Electronically Signed   By: Narda Rutherford M.D.   On: 10/03/2019 21:42    CT Angio Chest/Abd/Pel for Dissection W and/or Wo Contrast  Result Date: 10/03/2019 CLINICAL DATA:  Chest pain or back pain, aortic dissection suspected EXAM: CT ANGIOGRAPHY CHEST, ABDOMEN AND PELVIS TECHNIQUE: Multidetector CT imaging through the chest, abdomen and pelvis was performed using the standard protocol during bolus administration of intravenous contrast. Multiplanar reconstructed images and MIPs were obtained and reviewed to evaluate the vascular anatomy. CONTRAST:  39mL OMNIPAQUE IOHEXOL 350 MG/ML SOLN. 100 cc Omnipaque 350 IV extravasated in the left upper extremity, evaluated by non interpreting radiologist. COMPARISON:  Radiograph earlier this day. FINDINGS: CTA CHEST FINDINGS Cardiovascular: Thoracic aorta is normal in caliber without dissection, aortic hematoma, or aneurysm. Moderate aortic atherosclerosis. The left subclavian artery is occluded at its origin, reconstituted approximately 2 cm distally. No pulmonary embolus to the lobar level. Heart is normal in size. There are coronary artery calcifications. No pericardial effusion. Mediastinum/Nodes: Small mediastinal and hilar lymph nodes. No pathologic adenopathy. No esophageal wall thickening. No visualized thyroid nodule. Lungs/Pleura: 14 mm serpiginous tubular structure in the anterior right upper lobe, series 11 image 60, likely a small AVM. There is an additional tubular serpiginous lesion in the right lower lobe measuring approximately 2 cm, also likely a small AVM. No confluent airspace disease. No evidence of pulmonary edema. No pleural fluid. Musculoskeletal: Extravasated IV contrast within the musculature of the left upper extremity. Severe compression fracture of T9 and T11, technically age indeterminate but likely chronic. Remote sternal fracture. Implanted loop recorder in the left chest wall. Remote posterior right rib fracture. Review of the MIP images confirms the above findings. CTA ABDOMEN AND PELVIS FINDINGS VASCULAR  Aorta: Moderate atherosclerosis. No dissection, aneurysm, or evidence of vasculitis. Celiac: Splenic and hepatic arteries with separate origins from the aorta. Plaque at the origin of the common hepatic artery causes mild stenosis with poststenotic dilatation. SMA: Plaque at the origin but no significant stenosis. No dissection or evidence of vasculitis. Distal branch vessels are patent. Renals: Plaque at the origin of both renal arteries, left greater than right. No dissection or aneurysm. No evidence of vasculitis. IMA: Patent. Inflow: Patent without aneurysm, dissection, vasculitis or severe stenosis. Moderate atherosclerosis. Veins: No obvious venous abnormality within the limitations of this arterial phase study. Review of the MIP images confirms the above findings. NON-VASCULAR Hepatobiliary: Minimal contrast refluxes into the hepatic veins and IVC. No focal hepatic lesion. Gallbladder is unremarkable. No biliary dilatation. Pancreas: No ductal dilatation or inflammation. Spleen: Normal in size and arterial enhancement. Adrenals/Urinary Tract: Mild left adrenal thickening without dominant nodule. Normal right adrenal gland. No hydronephrosis. No perinephric edema. Mild cortical scarring in the upper left kidney. Small bilateral renal cysts. Early excretion of IV contrast in both renal collecting systems likely from extravasated contrast material. Excreted IV contrast in the urinary bladder which is otherwise unremarkable. Stomach/Bowel: Stomach is nondistended. No small bowel wall  thickening, inflammation or obstruction. Equivocal mild wall thickening of the ascending colon without pericolonic edema. Colonic diverticulosis without evidence of diverticulitis. Lymphatic: No adenopathy. Reproductive: Status post hysterectomy. No adnexal masses. Other: No free air, free fluid, or intra-abdominal fluid collection. Musculoskeletal: Mild compression fracture of L1 and L2, moderate compression fracture of L4. These  are technically age indeterminate but likely chronic. Bones are diffusely under mineralized. Review of the MIP images confirms the above findings. IMPRESSION: CTA CHEST 1. No aortic dissection or acute aortic abnormality. Moderate aortic atherosclerosis. 2. 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 3. Probable small pulmonary AVM's in the right upper and right lower lobes (1.4 cm in 2.0 cm respectively). No prior exams to evaluate for comparison. Consider follow-up chest CT with contrast in 6 months to evaluate for stability. 4. Coronary artery calcifications. 5. Equivocal mild wall thickening of the ascending colon without pericolonic edema. This may be secondary to colitis. 6. Compression fractures of T9, T11, L1, L2, and L4, technically age indeterminate but likely chronic. 7. Colonic diverticulosis without diverticulitis. 8. IV contrast extravasation in the left upper extremity. Aortic Atherosclerosis (ICD10-I70.0). Electronically Signed   By: Narda RutherfordMelanie  Sanford M.D.   On: 10/03/2019 23:49   VAS US CAROTID  Result Date: 10/04/2019 Carotid Arterial Duplex Study Indications:       Syncope and Dizziness. Risk Factors:      Left subclavian artery stenosis at origin by CTA.                    Hypertension, hyperlipidemia, coronary artery disease. Other Factors:     Paroxysmal atrial fibrillation, sick sinus syndrome/. Comparison Study:  No prior study on file for comparison Performing Technologist: Sherren Kernsandace Kanady RVS  Examination Guidelines: A complete evaluation includes B-mode imaging, spectral Doppler, color Doppler, and power Doppler as needed of all accessible portions of each vessel. Bilateral testing is considered an integral part of a complete examination. Limited examinations for reoccurring indications may be performed as noted.  Right Carotid Findings: +----------+--------+--------+--------+------------------+------------------+             PSV  cm/s EDV cm/s Stenosis Plaque Description Comments            +----------+--------+--------+--------+------------------+------------------+  CCA Prox   88       8                                    intimal thickening  +----------+--------+--------+--------+------------------+------------------+  CCA Distal 72       12                                   intimal thickening  +----------+--------+--------+--------+------------------+------------------+  ICA Prox   113      24                heterogenous                           +----------+--------+--------+--------+------------------+------------------+  ICA Distal 118      21                                   tortuous            +----------+--------+--------+--------+------------------+------------------+  ECA        95       6                                                        +----------+--------+--------+--------+------------------+------------------+ +----------+--------+-------+--------+-------------------+             PSV cm/s EDV cms Describe Arm Pressure (mmHG)  +----------+--------+-------+--------+-------------------+  Subclavian 170                                            +----------+--------+-------+--------+-------------------+ +---------+--------+--+--------+--+  Vertebral PSV cm/s 83 EDV cm/s 28  +---------+--------+--+--------+--+  Left Carotid Findings: +----------+--------+--------+--------+------------------+------------------+             PSV cm/s EDV cm/s Stenosis Plaque Description Comments            +----------+--------+--------+--------+------------------+------------------+  CCA Prox   83       14                                   intimal thickening  +----------+--------+--------+--------+------------------+------------------+  CCA Distal 79       14                                   intimal thickening  +----------+--------+--------+--------+------------------+------------------+  ICA Prox   143      29                calcific            Shadowing           +----------+--------+--------+--------+------------------+------------------+  ICA Distal 123      20                                   tortuous            +----------+--------+--------+--------+------------------+------------------+  ECA        96       8                                                        +----------+--------+--------+--------+------------------+------------------+ +----------+--------+--------+--------+-------------------+             PSV cm/s EDV cm/s Describe Arm Pressure (mmHG)  +----------+--------+--------+--------+-------------------+  Subclavian 68                                              +----------+--------+--------+--------+-------------------+ +---------+--------+--+--------+-+  Vertebral PSV cm/s 85 EDV cm/s 4  +---------+--------+--+--------+-+  Summary: Right Carotid: Velocities in the right ICA are consistent with a 1-39% stenosis. Left Carotid: Velocities in the left ICA are consistent with a 1-39% stenosis. Vertebrals:  Bilateral vertebral arteries demonstrate antegrade flow. Subclavians: Normal flow hemodynamics were seen in bilateral subclavian  arteries. *See table(s) above for measurements and observations.     Preliminary     Cardiac Studies   TTE 08/08/17 - Left ventricle: The cavity size was normal. Wall thickness was   increased in a pattern of moderate LVH. Systolic function was   normal. The estimated ejection fraction was in the range of 60%   to 65%. The study is not technically sufficient to allow   evaluation of LV diastolic function. - Aortic valve: Trileaflet. Sclerosis without stenosis. There was   mild regurgitation. - Mitral valve: Posterior calcified annulus. Mildly thickened   leaflets, dilated annulus. There was moderate to severe   regurgitation. Effective regurgitant orifice (PISA): 0.26 cm^2.   Regurgitant volume (PISA): 53 ml. - Left atrium: Severely dilated. - Right atrium: The atrium was mildly  dilated. - Atrial septum: No defect or patent foramen ovale was identified. - Tricuspid valve: There was moderate regurgitation. - Pulmonary arteries: PA peak pressure: 42 mm Hg (S). - Systemic veins: The IVC measures <2.1 cm, but does not collapse   >50%, suggesting an elevated RA pressure of 8 mmHg.  Patient Profile     86 y.o. female with history of dementia, tachybradycardia syndrome, paroxysmal atrial fibrillation, hypothyroidism, coronary artery disease presented to the hospital after an episode of syncope.  Assessment & Plan    1.  Tachybradycardia syndrome: Had significant tachycardia with bradycardia in the emergency room.  She had not been on any medications.  She has not been anticoagulated per her family's wishes.  She was started on amiodarone to hopefully keep her in normal rhythm and prevent her from coming back in and out of the hospital.  She remains in sinus rhythm.  Cardiology to sign off today.  Would continue with amiodarone at the current dose.  We Jaylenn Baiza arrange for follow-up in the cardiology clinic.    2.  Syncope: Unclear as to the cause of her current episode of syncope.  It could be due to her tachybradycardia syndrome.  Husband does not wish for further invasive measures such as pacemaker implant.  Would continue current monitoring.  For questions or updates, please contact [facility] Please consult [email or web address] for contact info under    [facility] Asharia Lotter sign off.   Medication Recommendations:  Amiodarone 200 mg daily Other recommendations (labs, testing, etc):   Follow up as an outpatient:  To be arranged in cardiology clinic     Signed, Jovani Flury Jorja Loa, MD  10/05/2019, 9:17 AM

## 2019-10-05 NOTE — Evaluation (Signed)
Occupational Therapy Evaluation Patient Details Name: Deborah [name] MRN: [id number] DOB: [birth year] Today's Date: 10/05/2019    History of Present Illness 86 y.o. female with medical history significant of paroxysmal A. fib, sick sinus syndrome, CAD status post PCI, chronic diastolic congestive heart failure, hypertension, hyperlipidemia, hypothyroidism, vasovagal syncope, vertigo presenting to the ED for evaluation of dizziness and syncope.   Clinical Impression   Pt PTA: living with spouse and mostly independent prior. Pt currently close to baseline functional for ADL tasks and requiring minA overall for stability. Pt using SPC in room for mobility with minguardA. Pt requiring cues to direct attention to perform a task as pt easily distracted. Pt's spouse in room for session. Pt reporting "my dizziness is much better today." Pt would benefit from continued OT skilled services for ADL and safety. OT following acutely.   HR 68-88 BPM and VSS on RA.     Follow Up Recommendations  No OT follow up;Supervision/Assistance - 24 hour    Equipment Recommendations  None recommended by OT    Recommendations for Other Services       Precautions / Restrictions Precautions Precautions: Fall Restrictions Weight Bearing Restrictions: No      Mobility Bed Mobility Overal bed mobility: Needs Assistance Bed Mobility: Supine to Sit     Supine to sit: Supervision     General bed mobility comments: pt in recliner upon enter   Transfers Overall transfer level: Needs assistance(deferred OOB mobility due to pt reporting she may pass out) Equipment used: Straight cane Transfers: Sit to/from Stand Sit to Stand: Min guard;Min assist         General transfer comment: minA from recliner     Balance Overall balance assessment: Needs assistance Sitting-balance support: Bilateral upper extremity supported;Feet supported Sitting balance-Leahy Scale: Poor   Postural control: Posterior  lean Standing balance support: Single extremity supported Standing balance-Leahy Scale: Poor Standing balance comment: reliant on external support                           ADL either performed or assessed with clinical judgement   ADL Overall ADL's : At baseline                                       General ADL Comments: pt requiring cues to assist and direct attention, otherwise, pt is close to functional level. Pt required minguardA at sink for brushing teeth and light grooming.     Vision Baseline Vision/History: No visual deficits Patient Visual Report: No change from baseline Vision Assessment?: No apparent visual deficits     Perception     Praxis      Pertinent Vitals/Pain Pain Assessment: Faces Faces Pain Scale: No hurt Pain Intervention(s): Monitored during session     Hand Dominance Right   Extremity/Trunk Assessment Upper Extremity Assessment Upper Extremity Assessment: Generalized weakness   Lower Extremity Assessment Lower Extremity Assessment: Generalized weakness   Cervical / Trunk Assessment Cervical / Trunk Assessment: Kyphotic   Communication Communication Communication: Expressive difficulties(not always speaking intelligibly or using an empty phrase)   Cognition Arousal/Alertness: Awake/alert(waxing and waning at times) Behavior During Therapy: WFL for tasks assessed/performed Overall Cognitive Status: Impaired/Different from baseline Area of Impairment: Orientation;Attention;Memory;Following commands;Safety/judgement;Problem solving                 Orientation Level: Disoriented to;Time;Situation Current Attention Level: Alternating  Memory: Decreased short-term memory Following Commands: Follows one step commands with increased time Safety/Judgement: Decreased awareness of safety;Decreased awareness of deficits   Problem Solving: Slow processing;Requires verbal cues;Requires tactile cues General Comments:  Pt with decreased ability to find sink- pt walking away from it requiring physical assist to stand in front.    General Comments  Spouse in room    Exercises     Shoulder Instructions      Home Living Family/patient expects to be discharged to:: Private residence Living Arrangements: Spouse/significant other Available Help at Discharge: Family;Available 24 hours/day Type of Home: House Home Access: Stairs to enter [employer] of Steps: 4 Entrance Stairs-Rails: Can reach both Home Layout: One level     Bathroom Shower/Tub: Producer, television/film/video: Handicapped height Bathroom Accessibility: Yes   Home Equipment: Cane - single point;Grab bars - toilet;Grab bars - tub/shower          Prior Functioning/Environment Level of Independence: Independent with assistive device(s)        Comments: short distances with use of cane        OT Problem List: Decreased strength;Decreased activity tolerance;Decreased cognition;Decreased safety awareness      OT Treatment/Interventions: Self-care/ADL training;Therapeutic exercise;Neuromuscular education;Therapeutic activities;Patient/family education;Balance training;Cognitive remediation/compensation;Energy conservation    OT Goals(Current goals can be found in the care plan section) Acute Rehab OT Goals Patient Stated Goal: go home OT Goal Formulation: With family Time For Goal Achievement: 10/19/19 Potential to Achieve Goals: Good ADL Goals Pt Will Perform Lower Body Dressing: with min guard assist;sitting/lateral leans;sit to/from stand Pt Will Transfer to Toilet: bedside commode;with supervision;ambulating Pt Will Perform Toileting - Clothing Manipulation and hygiene: with supervision;sitting/lateral leans Pt/caregiver will Perform Home Exercise Program: Increased strength;Both right and left upper extremity Additional ADL Goal #1: Pt will follow 1 step commands consistently in 2/3 trials with 100% accuracy.   OT Frequency: Min 2X/week   Barriers to D/C:            Co-evaluation              AM-PAC OT "6 Clicks" Daily Activity     Outcome Measure Help from another person eating meals?: A Little Help from another person taking care of personal grooming?: A Little Help from another person toileting, which includes using toliet, bedpan, or urinal?: A Little Help from another person bathing (including washing, rinsing, drying)?: A Little Help from another person to put on and taking off regular upper body clothing?: A Little Help from another person to put on and taking off regular lower body clothing?: A Little 6 Click Score: 18   End of Session Equipment Utilized During Treatment: Gait belt Nurse Communication: Mobility status  Activity Tolerance: Patient tolerated treatment well Patient left: in chair;with call bell/phone within reach;with chair alarm set;with family/visitor present  OT Visit Diagnosis: Unsteadiness on feet (R26.81);Muscle weakness (generalized) (M62.81);Other symptoms and signs involving cognitive function                Time: 9379-0240 OT Time Calculation (min): 28 min Charges:  OT General Charges $OT Visit: 1 Visit OT Evaluation $OT Eval Moderate Complexity: 1 Mod OT Treatments $Self Care/Home Management : 8-22 mins  Flora Lipps OTR/L Acute Rehabilitation Services Pager: [phone] Office: [phone]   Brentt Fread C 10/05/2019, 3:16 PM

## 2019-10-06 ENCOUNTER — Inpatient Hospital Stay (HOSPITAL_COMMUNITY): Payer: Medicare HMO

## 2019-10-06 LAB — URINALYSIS, ROUTINE W REFLEX MICROSCOPIC
Bilirubin Urine: NEGATIVE
Glucose, UA: NEGATIVE mg/dL
Hgb urine dipstick: NEGATIVE
Ketones, ur: NEGATIVE mg/dL
Nitrite: NEGATIVE
Protein, ur: NEGATIVE mg/dL
Specific Gravity, Urine: 1.023 (ref 1.005–1.030)
pH: 5 (ref 5.0–8.0)

## 2019-10-06 MED ORDER — AMIODARONE HCL 200 MG PO TABS: 200.0000 mg | ORAL_TABLET | Freq: Every day | ORAL | 1 refills | Status: DC

## 2019-10-06 MED ORDER — MECLIZINE HCL 25 MG PO TABS
12.5000 mg | ORAL_TABLET | Freq: Three times a day (TID) | ORAL | Status: DC | PRN
  Administered 2019-10-06: 13:00:00 12.5 mg via ORAL
  Filled 2019-10-06: qty 1

## 2019-10-06 MED ORDER — SODIUM CHLORIDE 0.9 % IV SOLN
1.0000 g | INTRAVENOUS | Status: DC
  Administered 2019-10-06 – 2019-10-07 (×2): 1 g via INTRAVENOUS
  Filled 2019-10-06 (×2): qty 10

## 2019-10-06 MED ORDER — ONDANSETRON HCL 4 MG/2ML IJ SOLN: 4.0000 mg | Freq: Four times a day (QID) | INTRAMUSCULAR | Status: DC | PRN

## 2019-10-06 MED ORDER — HYDROXYZINE HCL 10 MG PO TABS
5.0000 mg | ORAL_TABLET | Freq: Three times a day (TID) | ORAL | Status: DC | PRN
  Administered 2019-10-06: 13:00:00 5 mg via ORAL
  Filled 2019-10-06: qty 1

## 2019-10-06 MED ORDER — MECLIZINE HCL 25 MG PO TABS: 12.5000 mg | ORAL_TABLET | Freq: Three times a day (TID) | ORAL | 0 refills | Status: DC | PRN

## 2019-10-06 NOTE — Progress Notes (Signed)
PROGRESS NOTE    Simren Popson  PQZ:[id number] DOB: 06-25-1931 DOA: 10/03/2019 PCP: Shirline Frees, NP   Brief Narrative: As per admitting physician: 86 y.o.femalewith medical history significant ofparoxysmal A. fib, sick sinus syndrome, CAD status post PCI, chronic diastolic congestive heart failure, hypertension, hyperlipidemia, hypothyroidism, vasovagal syncope, vertigo presenting to the ED for evaluation of dizziness and syncope. She was brought to the ED by her husband for evaluation of dizziness.  It is reported that the patient went to use the bathroom before dinner and had a syncopal episode, her son caught her and she did not fall to the ground or strike her head.  Family reported that patient has not been feeling well for the past few days and has had intermittent episodes of dizziness and has been very fatigued and sleeping, she has had a poor appetite. ED Course: Found to be in A. fib with RVR.  Afebrile.  WBC count 11.9.  Lactic acid normal. Hemoglobin 11.8, previously in the 11-12 range.  High-sensitivity troponin 10 >21.  BNP 295. Magnesium normal.  TSH normal.  SARS-CoV-2 PCR test negative.  Blood culture x2 pending.  Chest x-ray showing no active cardiopulmonary abnormality.  CT angiogram chest/abdomen/pelvis negative for aortic dissection.  Head CT negative for acute intracranial abnormality. In the ED, Cardizem was ordered but before patient could receive a she suddenly became hypotensive with systolic in the 70s and her heart rate dropped from 140s to 80s.  She was pale, diaphoretic, and complained of dizziness and feeling like she was going to pass out.  She was given 1.5 L fluid boluses and phenylephrine push dose which caused improvement of her blood pressures.  Husband told cardiology that patient is DNR and he does not want any transcutaneous pacing or pacemaker. Patient also had contrast extravasation of the left upper extremity with swelling and erythema but distal pulses  intact.  Warm compresses applied. She was admitted underwent further work-up, seen by cardiology  placed in amiodarone. At this time vitals are stable, patient is asymptomatic. Cardiology signed off and cleared for discharge.  PT OT has worked with the patient and advised 24 assistance and home health PT. Patient had episode of shaking dizzy and discharge was held this afternoon.  Subjective: Seen this morning was resting comfortably.  Later in the morning was at the bedside chair was feeling shaky was dizzy discharge held.  Husband was at the bedside.  She denied any focal weakness, headache..  Assessment & Plan:  Tachybradycardia syndrome: Significant tachybradycardia in the ER, patient not been on any medication, has not been anticoagulated per family's wishes.  Seen by cardiology started on amiodarone.  Remains in sinus rhythm cardiology has signed off and advised outpatient follow-up  Syncope unclear etiology patient underwent further work-up bilateral carotid duplex 1 to 39% stenosis, vertebral arteries antegrade flow and no acute finding..  She is to follow-up with cardiology as outpatient. Apparently husband did not wish for further invasive measures such as pacemaker implant in the past.  Possible generalized anxiety disorder with panic probably she might be reacting to the tachybradycardia syndrome with a panic symptom because of the way it makes her feel. Follow up with pcp.  Patient is on Xanax at home but husband reports he gets "knocked out" by it I will add Atarax as needed.  Episode of shaking: Patient is resting on the bedside chair was feeling dizzy episode of shaking.  No seizures.  Vitals were stable no acute finding on the telemetry.patient personally seen  and was nonfocal on exam. ? From  UTI given abnormal urinalysis started on ceftriaxone check EEG rule out seizure given #1. ?  Due to anxiety episode.  Continue IV fluid hydration and monitor overnight.  Abnormal urinalysis  with the patient was shaking this afternoon starting on ceftriaxone.    Slightly dehydrated with specific gravity of 1.39 treated with IV :treated with ivf,encourage oral intake.  Hold Lasix for now.   Body mass index is 21.39 kg/m.   DVT prophylaxis: lovenox Code Status: DNR Family Communication: plan of care discussed with patient at bedside. Disposition Plan: Home the discharge today starting ceftriaxone, monitor overnight on IV fluids, check orthostatics.   Consultants: Cardiology Procedures:none Microbiology:  Antimicrobials: Anti-infectives (From admission, onward)   Start     Dose/Rate Route Frequency Ordered Stop   10/06/19 1300  cefTRIAXone (ROCEPHIN) 1 g in sodium chloride 0.9 % 100 mL IVPB     1 g 200 mL/hr over 30 Minutes Intravenous Every 24 hours 10/06/19 1214         Objective: Vitals:   10/05/19 2000 10/06/19 0353 10/06/19 1235 10/06/19 1247  BP:  (!) 100/58 (!) 178/74   Pulse:  67    Resp: (!) 21 20 (!) 25   Temp:  97.9 F (36.6 C)  97.8 F (36.6 C)  TempSrc:  Oral  Oral  SpO2:  96%    Weight:  49.7 kg    Height:        Intake/Output Summary (Last 24 hours) at 10/06/2019 1549 Last data filed at 10/06/2019 1036 Gross per 24 hour  Intake 565.46 ml  Output 700 ml  Net -134.54 ml   Filed Weights   10/03/19 2012 10/05/19 0330 10/06/19 0353  Weight: 49.9 kg 49.8 kg 49.7 kg   Weight change: -0.131 kg  Body mass index is 21.39 kg/m.  Intake/Output from previous day: 01/17 0701 - 01/18 0700 In: 805.5 [P.O.:360; I.V.:445.5] Out: 300 [Urine:300] Intake/Output this shift: Total I/O In: -  Out: 400 [Urine:400]  Examination:  General exam: AAO, frail weak, HEENT:Oral mucosa moist, Ear/Nose WNL grossly, dentition normal. Respiratory system:  bilateral clear breath sounds, no use of accessory muscle Cardiovascular system: S1 & S2 +, No JVD,. Gastrointestinal system: Abdomen soft, NT,ND, BS+ Nervous System:Alert, awake, moving extremities and  grossly nonfocal Extremities: No edema, distal peripheral pulses palpable.  Skin: No rashes,no icterus. MSK: Normal muscle bulk,tone, power  Medications:  Scheduled Meds: . amiodarone  200 mg Oral Daily  . aspirin EC  81 mg Oral Daily  . enoxaparin (LOVENOX) injection  30 mg Subcutaneous Q24H  . ferrous sulfate  325 mg Oral BID WC  . levothyroxine  75 mcg Oral Q0600  . pantoprazole  20 mg Oral Daily   Continuous Infusions: . sodium chloride 50 mL/hr at 10/06/19 0600  . cefTRIAXone (ROCEPHIN)  IV 1 g (10/06/19 1244)    Data Reviewed: I have personally reviewed following labs and imaging studies  CBC: Recent Labs  Lab 10/04/19 0019  WBC 11.9*  NEUTROABS 11.0*  HGB 11.8*  HCT 37.1  MCV 94.6  PLT 364   Basic Metabolic Panel: Recent Labs  Lab 10/03/19 2024  NA 133*  K 3.9  CL 96*  CO2 25  GLUCOSE 145*  BUN 12  CREATININE 0.75  CALCIUM 9.0  MG 2.1   GFR: Estimated Creatinine Clearance: 36.3 mL/min (by C-G formula based on SCr of 0.75 mg/dL). Liver Function Tests: No results for input(s): AST, ALT, ALKPHOS, BILITOT, PROT, ALBUMIN  in the last 168 hours. No results for input(s): LIPASE, AMYLASE in the last 168 hours. Recent Labs  Lab 10/04/19 0648  AMMONIA 10   Coagulation Profile: No results for input(s): INR, PROTIME in the last 168 hours. Cardiac Enzymes: No results for input(s): CKTOTAL, CKMB, CKMBINDEX, TROPONINI in the last 168 hours. BNP (last 3 results) No results for input(s): PROBNP in the last 8760 hours. HbA1C: No results for input(s): HGBA1C in the last 72 hours. CBG: No results for input(s): GLUCAP in the last 168 hours. Lipid Profile: No results for input(s): CHOL, HDL, LDLCALC, TRIG, CHOLHDL, LDLDIRECT in the last 72 hours. Thyroid Function Tests: Recent Labs    10/04/19 0019  TSH 3.028   Anemia Panel: Recent Labs    10/04/19 0648  VITAMINB12 484   Sepsis Labs: Recent Labs  Lab 10/04/19 0019  LATICACIDVEN 1.9    Recent  Results (from the past 240 hour(s))  SARS CORONAVIRUS 2 (TAT 6-24 HRS) Nasopharyngeal Nasopharyngeal Swab     Status: None   Collection Time: 10/03/19  8:53 PM   Specimen: Nasopharyngeal Swab  Result Value Ref Range Status   SARS Coronavirus 2 NEGATIVE NEGATIVE Final    Comment: (NOTE) SARS-CoV-2 target nucleic acids are NOT DETECTED. The SARS-CoV-2 RNA is generally detectable in upper and lower respiratory specimens during the acute phase of infection. Negative results do not preclude SARS-CoV-2 infection, do not rule out co-infections with other pathogens, and should not be used as the sole basis for treatment or other patient management decisions. Negative results must be combined with clinical observations, patient history, and epidemiological information. The expected result is Negative. Fact Sheet for Patients: SugarRoll.be Fact Sheet for Healthcare Providers: https://www.woods-mathews.com/ This test is not yet approved or cleared by the [place] FDA and  has been authorized for detection and/or diagnosis of SARS-CoV-2 by FDA under an Emergency Use Authorization (EUA). This EUA will remain  in effect (meaning this test can be used) for the duration of the COVID-19 declaration under Section 56 4(b)(1) of the Act, 21 U.S.C. section 360bbb-3(b)(1), unless the authorization is terminated or revoked sooner. Performed at [facility], [place] [address], [place], [place] [address]   Culture, blood (routine x 2)     Status: None (Preliminary result)   Collection Time: 10/04/19 12:19 AM   Specimen: BLOOD  Result Value Ref Range Status   Specimen Description BLOOD RIGHT HAND  Final   Special Requests   Final    BOTTLES DRAWN AEROBIC AND ANAEROBIC Blood Culture results may not be optimal due to an inadequate volume of blood received in culture bottles   Culture   Final    NO GROWTH 2 DAYS Performed at [facility], [place]  [address] [place], [place] [address]    Report Status PENDING  Incomplete  Respiratory Panel by RT PCR (Flu A&B, Covid) - Nasopharyngeal Swab     Status: None   Collection Time: 10/04/19  1:00 AM   Specimen: Nasopharyngeal Swab  Result Value Ref Range Status   SARS Coronavirus 2 by RT PCR NEGATIVE NEGATIVE Final    Comment: (NOTE) SARS-CoV-2 target nucleic acids are NOT DETECTED. The SARS-CoV-2 RNA is generally detectable in upper respiratoy specimens during the acute phase of infection. The lowest concentration of SARS-CoV-2 viral copies this assay can detect is 131 copies/mL. A negative result does not preclude SARS-Cov-2 infection and should not be used as the sole basis for treatment or other patient management decisions. A  negative result may occur with  improper specimen collection/handling, submission of specimen other than nasopharyngeal swab, presence of viral mutation(s) within the areas targeted by this assay, and inadequate number of viral copies (<131 copies/mL). A negative result must be combined with clinical observations, patient history, and epidemiological information. The expected result is Negative. Fact Sheet for Patients:  https://www.moore.com/ Fact Sheet for Healthcare Providers:  https://www.young.biz/ This test is not yet ap proved or cleared by the [place] FDA and  has been authorized for detection and/or diagnosis of SARS-CoV-2 by FDA under an Emergency Use Authorization (EUA). This EUA will remain  in effect (meaning this test can be used) for the duration of the COVID-19 declaration under Section 564(b)(1) of the Act, 21 U.S.C. section 360bbb-3(b)(1), unless the authorization is terminated or revoked sooner.    Influenza A by PCR NEGATIVE NEGATIVE Final   Influenza B by PCR NEGATIVE NEGATIVE Final    Comment: (NOTE) The Xpert Xpress SARS-CoV-2/FLU/RSV assay is intended as an aid in  the diagnosis of influenza  from Nasopharyngeal swab specimens and  should not be used as a sole basis for treatment. Nasal washings and  aspirates are unacceptable for Xpert Xpress SARS-CoV-2/FLU/RSV  testing. Fact Sheet for Patients: https://www.moore.com/ Fact Sheet for Healthcare Providers: https://www.young.biz/ This test is not yet approved or cleared by the [place] FDA and  has been authorized for detection and/or diagnosis of SARS-CoV-2 by  FDA under an Emergency Use Authorization (EUA). This EUA will remain  in effect (meaning this test can be used) for the duration of the  Covid-19 declaration under Section 564(b)(1) of the Act, 21  U.S.C. section 360bbb-3(b)(1), unless the authorization is  terminated or revoked. Performed at [facility], [address], [place], [place] [address]       Radiology Studies: No results found.    LOS: 2 days   Time spent: More than 50% of that time was spent in counseling and/or coordination of care.  Lanae Boast, MD [facility]  10/06/2019, 3:49 PM

## 2019-10-06 NOTE — Progress Notes (Signed)
EEG complete - results pending 

## 2019-10-07 DIAGNOSIS — R55 Syncope and collapse: Secondary | ICD-10-CM

## 2019-10-07 DIAGNOSIS — R42 Dizziness and giddiness: Secondary | ICD-10-CM

## 2019-10-07 LAB — CBC
HCT: 30.7 % — ABNORMAL LOW (ref 36.0–46.0)
Hemoglobin: 10 g/dL — ABNORMAL LOW (ref 12.0–15.0)
MCH: 30 pg (ref 26.0–34.0)
MCHC: 32.6 g/dL (ref 30.0–36.0)
MCV: 92.2 fL (ref 80.0–100.0)
Platelets: 376 10*3/uL (ref 150–400)
RBC: 3.33 MIL/uL — ABNORMAL LOW (ref 3.87–5.11)
RDW: 13.5 % (ref 11.5–15.5)
WBC: 5.8 10*3/uL (ref 4.0–10.5)
nRBC: 0 % (ref 0.0–0.2)

## 2019-10-07 LAB — BASIC METABOLIC PANEL
Anion gap: 10 (ref 5–15)
BUN: 10 mg/dL (ref 8–23)
CO2: 23 mmol/L (ref 22–32)
Calcium: 8.1 mg/dL — ABNORMAL LOW (ref 8.9–10.3)
Chloride: 106 mmol/L (ref 98–111)
Creatinine, Ser: 0.63 mg/dL (ref 0.44–1.00)
GFR calc Af Amer: >60 mL/min (ref >60)
GFR calc non Af Amer: >60 mL/min (ref >60)
Glucose, Bld: 90 mg/dL (ref 70–99)
Potassium: 3.4 mmol/L — ABNORMAL LOW (ref 3.5–5.1)
Sodium: 139 mmol/L (ref 135–145)

## 2019-10-07 MED ORDER — CEFDINIR 300 MG PO CAPS: 300.0000 mg | ORAL_CAPSULE | Freq: Two times a day (BID) | ORAL | 0 refills | Status: AC

## 2019-10-07 MED ORDER — POTASSIUM CHLORIDE CRYS ER 20 MEQ PO TBCR
40.0000 meq | EXTENDED_RELEASE_TABLET | Freq: Once | ORAL | Status: AC
  Administered 2019-10-07: 08:00:00 40 meq via ORAL
  Filled 2019-10-07: qty 2

## 2019-10-07 NOTE — Progress Notes (Signed)
Occupational Therapy Treatment Patient Details Name: Deborah [name] MRN: [id number] DOB: 12/09/1930 Today's Date: 10/07/2019    History of present illness 86 y.o. female with medical history significant of paroxysmal A. fib, sick sinus syndrome, CAD status post PCI, chronic diastolic congestive heart failure, hypertension, hyperlipidemia, hypothyroidism, vasovagal syncope, vertigo presenting to the ED for evaluation of dizziness and syncope.   OT comments  Patient semi-supine in bed upon arrival, agreeable to OT. Patient supervision for safety with ambulation to bathroom, supervision with min verbal cues for safety with transfer on/off toilet and supervision level for sink side grooming/hygiene. Patient progressing towards acute OT goals, will continue to follow.    Follow Up Recommendations  No OT follow up;Supervision/Assistance - 24 hour    Equipment Recommendations  None recommended by OT       Precautions / Restrictions Precautions Precautions: Fall Restrictions Weight Bearing Restrictions: No       Mobility Bed Mobility Overal bed mobility: Modified Independent             General bed mobility comments: OOB in chair upon entry  Transfers Overall transfer level: Needs assistance Equipment used: None Transfers: Sit to/from Stand Sit to Stand: Supervision         General transfer comment: S for safety    Balance Overall balance assessment: Needs assistance Sitting-balance support: Feet supported;No upper extremity supported Sitting balance-Leahy Scale: Good Sitting balance - Comments: supervision    Standing balance support: No upper extremity supported;During functional activity Standing balance-Leahy Scale: Fair                             ADL either performed or assessed with clinical judgement   ADL Overall ADL's : At baseline                                       General ADL Comments: pt requires cues for sequencing,  problem solving but supervision for mobility and performing grooming/hygiene at sink and to use toilet to void.                Cognition Arousal/Alertness: Awake/alert Behavior During Therapy: WFL for tasks assessed/performed Overall Cognitive Status: No family/caregiver present to determine baseline cognitive functioning Area of Impairment: Memory;Safety/judgement;Problem solving                 Orientation Level: Disoriented to;Time;Situation Current Attention Level: Alternating Memory: Decreased short-term memory Following Commands: Follows one step commands with increased time Safety/Judgement: Decreased awareness of safety   Problem Solving: Difficulty sequencing;Requires verbal cues General Comments: pleasantly confused and perseverating on if her husband is here yet/when he is coming              General Comments VSS on room air    Pertinent Vitals/ Pain       Pain Assessment: No/denies pain Faces Pain Scale: No hurt Pain Intervention(s): Limited activity within patient's tolerance;Monitored during session         Frequency  Min 2X/week        Progress Toward Goals  OT Goals(current goals can now be found in the care plan section)  Progress towards OT goals: Progressing toward goals  Acute Rehab OT Goals Patient Stated Goal: go home OT Goal Formulation: With family Time For Goal Achievement: 10/19/19 Potential to Achieve Goals: Good ADL Goals Pt Will Perform Lower Body Dressing:  with supervision;sitting/lateral leans;sit to/from stand Pt Will Transfer to Toilet: bedside commode;with supervision;ambulating Pt Will Perform Toileting - Clothing Manipulation and hygiene: with supervision;sitting/lateral leans Pt/caregiver will Perform Home Exercise Program: Increased strength;Both right and left upper extremity Additional ADL Goal #1: Pt will follow 1 step commands consistently in 2/3 trials with 100% accuracy.  Plan Discharge plan remains  appropriate       AM-PAC OT "6 Clicks" Daily Activity     Outcome Measure   Help from another person eating meals?: A Little Help from another person taking care of personal grooming?: A Little Help from another person toileting, which includes using toliet, bedpan, or urinal?: A Little Help from another person bathing (including washing, rinsing, drying)?: A Little Help from another person to put on and taking off regular upper body clothing?: A Little Help from another person to put on and taking off regular lower body clothing?: A Little 6 Click Score: 18    End of Session  OT Visit Diagnosis: Unsteadiness on feet (R26.81);Muscle weakness (generalized) (M62.81);Other symptoms and signs involving cognitive function   Activity Tolerance Patient tolerated treatment well   Patient Left in chair;with call bell/phone within reach;with chair alarm set;with nursing/sitter in room   Nurse Communication Mobility status        Time: 6045-4098 OT Time Calculation (min): 19 min  Charges: OT General Charges $OT Visit: 1 Visit OT Treatments $Self Care/Home Management : 8-22 mins  [place] OT office: [place] 10/07/2019, 12:02 PM

## 2019-10-07 NOTE — TOC Progression Note (Signed)
Transition of Care [facility]) - Progression Note    Patient Details  Name: Deborah [name] MRN: [id number] Date of Birth: 1931-06-11  Transition of Care [facility]) CM/SW Contact  Graves-Bigelow, Lamar Laundry, RN Phone Number: 10/07/2019, 2:14 PM  Clinical Narrative:  Case Manager called several different agencies and had no luck with staffing. Case Manager called [facility] and they will be able to see the patient starting next Monday for services. Case Manager will make the patient and family aware of start date. No further needs from Case Manager at this time.   Expected Discharge Plan: Home w Home Health Services Barriers to Discharge: No Barriers Identified  Expected Discharge Plan and Services Expected Discharge Plan: Home w Home Health Services In-house Referral: NA Discharge Planning Services: CM Consult Post Acute Care Choice: Home Health Living arrangements for the past 2 months: Single Family Home Expected Discharge Date: 10/07/19               HH Arranged: RN, Disease Management, PT HH Agency: [facility] Date HH Agency Contacted: 10/07/19 Time HH Agency Contacted: 1413 Representative spoke with at [facility] Agency: Natalia Leatherwood   Social Determinants of Health (SDOH) Interventions    Readmission Risk Interventions No flowsheet data found.

## 2019-10-07 NOTE — Discharge Summary (Signed)
Physician Discharge Summary  Alferd ApaRose Retzloff OZH:[id number] DOB: [birth year] DOA: 10/03/2019  PCP: Shirline FreesNafziger, [name], NP  Admit date: 10/03/2019 Discharge date: 10/07/2019  Admitted From: home Disposition:  HOHPT  Recommendations for Outpatient Follow-up:  1. Follow up with PCP in 1-2 weeks 2. Please obtain BMP/CBC in one week 3. Please follow up on the following pending results:  Home Health:YES  Equipment/Devices: Rolling walker with 5" wheels  Discharge Condition: Stable Code Status: DNR Diet recommendation: Heart Healthy  Brief/Interim Summary:  As per admitting physician: 86 y.o.femalewith medical history significant ofparoxysmal A. fib, sick sinus syndrome, CAD status post PCI, chronic diastolic congestive heart failure, hypertension, hyperlipidemia, hypothyroidism, vasovagal syncope, vertigo presenting to the ED for evaluation of dizziness and syncope. She was brought to the ED by her husband for evaluation of dizziness. It is reported that the patient went to use the bathroom before dinner and had a syncopal episode, her son caught her and she did not fall to the ground or strike her head. Family reported that patient has not been feeling well for the past few days and has had intermittent episodes of dizziness and has been very fatigued and sleeping, she has had a poor appetite. ED Course:Found to be in A. fib with RVR. Afebrile. WBC count 11.9. Lactic acid normal. Hemoglobin 11.8, previously in the 11-12 range. High-sensitivity troponin 10 >21. BNP 295. Magnesium normal. TSH normal. SARS-CoV-2 PCR test negative. Blood culture x2 pending. Chest x-ray showing no active cardiopulmonary abnormality. CT angiogram chest/abdomen/pelvis negative for aortic dissection. Head CT negative for acute intracranial abnormality. In the ED, Cardizem was ordered but before patient could receive a she suddenly became hypotensive with systolic in the 70s and her heart rate dropped from 140s to  80s. She was pale, diaphoretic, and complained of dizziness and feeling like she was going to pass out. She was given 1.5 L fluid boluses and phenylephrine push dose which caused improvement of her blood pressures. Husbandtold cardiology that patient is DNR and he does not want any transcutaneous pacing or pacemaker. Patient also had contrast extravasation of the left upper extremity with swelling and erythema but distal pulses intact. Warm compresses applied. She was admitted underwent further work-up, seen by cardiology  placed in amiodarone. At this time vitals are stable, patient is asymptomatic. Cardiology signed off and cleared for discharge.  PT OT has worked with the patient and advised 24 assistance and home health PT. Patient had episode of shaking dizzy and discharge was held 1/18- placed on rocephin, eeg neg. This morning patient feels well and is stable for discharge.  Subjective: Sting comfortably at the bedside chair.  No nausea vomiting fever chills no shaking episode.  Assessment & Plan/Discharge Diagnoses:   Tachybradycardia syndrome: Significant tachybradycardia in the ER, patient not been on any medication, has not been anticoagulated per family's wishes.  Seen by cardiology started on amiodarone.  Remains in sinus rhythm cardiology has signed off and advised outpatient follow-up  Syncope unclear etiology patient underwent further work-up bilateral carotid duplex 1 to 39% stenosis, vertebral arteries antegrade flow and no acute finding..  She is to follow-up with cardiology as outpatient. Apparently husband did not wish for further invasive measures such as pacemaker implant in the past.  Possible generalized anxiety disorder with panic probably she might be reacting to the tachybradycardia syndrome with a panic symptom because of the way it makes her feel. Follow up with pcp.  Patient is on Xanax at home but husband reports he gets "knocked out"  Episode of shaking  1/18-suspect due to UTI.EEG was unremarkable.Orthostatic vitals negative. Treated with IV fluids. This morning she feels well orthostatics negative no dizziness shaking episode.  She feels well enough to go home.? Due to anxiety episode. She takes Xanax PRN.  We will discharge her on oral antibiotics  Slightly dehydrated with specific gravity of 1.39 treated with IV :treated with ivf,encourage oral intake.  Hold Lasix for for a week follow-up with PCP. Discussed with the patient and patient's husband at the bedside about when to resume Lasix especially if there is weight gain or shortness of breath or fluid overload.  They are going to follow-up with cardiology regarding the Lasix as well.   Body mass index is 21.39 kg/m.   DVT prophylaxis: lovenox Code Status: DNR Family Communication: plan of care discussed with patient husband at bedside.  Husband reluctant to have home health however after much discussion agreed.  Home health will be able to see only next week per case manager. Disposition Plan: Home w home health with husband providing 24 supervision.   Consultants: Cardiology Procedures:none  Discharge Exam: Vitals:   10/07/19 0700 10/07/19 0744  BP:  (!) 150/62  Pulse:  65  Resp:  18  Temp: 97.9 F (36.6 C)   SpO2: 96%    General: Pt is alert, awake, not in acute distress Cardiovascular: RRR, S1/S2 +, no rubs, no gallops Respiratory: CTA bilaterally, no wheezing, no rhonchi Abdominal: Soft, NT, ND, bowel sounds + Extremities: no edema, no cyanosis  Discharge Instructions  Discharge Instructions    (HEART FAILURE PATIENTS) Call MD:  Anytime you have any of the following symptoms: 1) 3 pound weight gain in 24 hours or 5 pounds in 1 week 2) shortness of breath, with or without a dry hacking cough 3) swelling in the hands, feet or stomach 4) if you have to sleep on extra pillows at night in order to breathe.   Complete by: As directed    Diet - low sodium heart healthy    Complete by: As directed    Discharge instructions   Complete by: As directed    Please call call MD or return to ER for similar or worsening recurring problem that brought you to hospital or if any fever,nausea/vomiting,abdominal pain, uncontrolled pain, chest pain,  shortness of breath or any other alarming symptoms.  Please follow-up your doctor as instructed in a week time and call the office for appointment.  Discuss with your cardiology regarding resuming Lasix in a week.   Please avoid alcohol, smoking, or any other illicit substance and maintain healthy habits including taking your regular medications as prescribed.  You were cared for by a hospitalist during your hospital stay. If you have any questions about your discharge medications or the care you received while you were in the hospital after you are discharged, you can call the unit and ask to speak with the hospitalist on call if the hospitalist that took care of you is not available.  Once you are discharged, your primary care physician will handle any further medical issues. Please note that NO REFILLS for any discharge medications will be authorized once you are discharged, as it is imperative that you return to your primary care physician (or establish a relationship with a primary care physician if you do not have one) for your aftercare needs so that they can reassess your need for medications and monitor your lab values.  If she gain wt as below please call her  cardiology to resume lasix. Please monitor weight daily, avoid strain and stop activity anytime you have chest pain or worsening shortness of breath. Anytime you have any of the following symptoms: 1) 3 pound weight gain in 24 hours or 5 pounds in 1 week 2) shortness of breath, with or without a dry hacking cough 3) swelling in the hands, feet or stomach 4) if you have to sleep on extra pillows at night in order to breathe.   Face-to-face encounter (required for  Medicare/Medicaid patients)   Complete by: As directed    I Lanae Boast certify that this patient is under my care and that I, or a nurse practitioner or physician's assistant working with me, had a face-to-face encounter that meets the physician face-to-face encounter requirements with this patient on 10/06/2019. The encounter with the patient was in whole, or in part for the following medical condition(s) which is the primary reason for home health care (List medical condition):syncope, deconditioning   The encounter with the patient was in whole, or in part, for the following medical condition, which is the primary reason for home health care: syncopy   I certify that, based on my findings, the following services are medically necessary home health services:  Physical therapy Nursing     Reason for Medically Necessary Home Health Services: Skilled Nursing- Skilled Assessment/Observation   My clinical findings support the need for the above services: Unable to leave home safely without assistance and/or assistive device   Further, I certify that my clinical findings support that this patient is homebound due to: Unsafe ambulation due to balance issues   Home Health   Complete by: As directed    To provide the following care/treatments:  PT RN     Increase activity slowly   Complete by: As directed      Allergies as of 10/07/2019      Reactions   Carvedilol Other (See Comments)   Bradycardia   Diltiazem Other (See Comments)   Bradycardia   Lopressor [metoprolol] Other (See Comments)   Bradycardia      Medication List    STOP taking these medications   furosemide 20 MG tablet Commonly known as: LASIX   potassium chloride 10 MEQ tablet Commonly known as: KLOR-CON     TAKE these medications   ALPRAZolam 0.25 MG tablet Commonly known as: XANAX Take 1 tablet (0.25 mg total) by mouth at bedtime as needed for anxiety.   amiodarone 200 MG tablet Commonly known as: PACERONE Take 1  tablet (200 mg total) by mouth daily.   aspirin EC 81 MG tablet Take 81 mg by mouth daily.   cefdinir 300 MG capsule Commonly known as: OMNICEF Take 1 capsule (300 mg total) by mouth 2 (two) times daily for 3 days.   ferrous sulfate 325 (65 FE) MG tablet Take 325 mg by mouth 2 (two) times daily with a meal.   fluticasone 50 MCG/ACT nasal spray Commonly known as: FLONASE Place 2 sprays into both nostrils daily as needed for allergies or rhinitis.   levothyroxine 75 MCG tablet Commonly known as: SYNTHROID TAKE 1 TABLET BY MOUTH ONCE A DAY BEFORE BREAKFAST   meclizine 25 MG tablet Commonly known as: ANTIVERT Take 0.5-1 tablets (12.5-25 mg total) by mouth 3 (three) times daily as needed for up to 30 doses for dizziness.   omeprazole 20 MG capsule Commonly known as: PRILOSEC Take 1 capsule (20 mg total) by mouth daily.   polyethylene glycol 17 g packet Commonly  known as: MIRALAX / GLYCOLAX Take 8.5 g by mouth daily as needed for moderate constipation. Mix 1/2 scoop (8.5 g) in 8 oz orange juice and drink daily   REFRESH OPTIVE OP Place 1 drop into both eyes daily as needed (dry eyes).   Tylenol 325 MG tablet Generic drug: acetaminophen Take 650 mg by mouth at bedtime. daily      Follow-up Information    Leonie Man, MD Follow up.   Specialty: Cardiology Why: the office will call to arrange follow up - if you have not heard by Wednesday then call the office.  Contact information: [address] [place] [address] [phone]        Dorothyann Peng, NP Follow up in 1 week(s).   Specialty: Family Medicine Contact information: [place] [address] [phone]          Allergies  Allergen Reactions  . Carvedilol Other (See Comments)    Bradycardia   . Diltiazem Other (See Comments)    Bradycardia  . Lopressor [Metoprolol] Other (See Comments)    Bradycardia    The results of significant diagnostics from this  hospitalization (including imaging, microbiology, ancillary and laboratory) are listed below for reference.    Microbiology: Recent Results (from the past 240 hour(s))  SARS CORONAVIRUS 2 (TAT 6-24 HRS) Nasopharyngeal Nasopharyngeal Swab     Status: None   Collection Time: 10/03/19  8:53 PM   Specimen: Nasopharyngeal Swab  Result Value Ref Range Status   SARS Coronavirus 2 NEGATIVE NEGATIVE Final    Comment: (NOTE) SARS-CoV-2 target nucleic acids are NOT DETECTED. The SARS-CoV-2 RNA is generally detectable in upper and lower respiratory specimens during the acute phase of infection. Negative results do not preclude SARS-CoV-2 infection, do not rule out co-infections with other pathogens, and should not be used as the sole basis for treatment or other patient management decisions. Negative results must be combined with clinical observations, patient history, and epidemiological information. The expected result is Negative. Fact Sheet for Patients: SugarRoll.be Fact Sheet for Healthcare Providers: https://www.woods-mathews.com/ This test is not yet approved or cleared by the [place] FDA and  has been authorized for detection and/or diagnosis of SARS-CoV-2 by FDA under an Emergency Use Authorization (EUA). This EUA will remain  in effect (meaning this test can be used) for the duration of the COVID-19 declaration under Section 56 4(b)(1) of the Act, 21 U.S.C. section 360bbb-3(b)(1), unless the authorization is terminated or revoked sooner. Performed at [facility], [place] [address], [place], [place] [address]   Culture, blood (routine x 2)     Status: None (Preliminary result)   Collection Time: 10/04/19 12:19 AM   Specimen: BLOOD  Result Value Ref Range Status   Specimen Description BLOOD RIGHT HAND  Final   Special Requests   Final    BOTTLES DRAWN AEROBIC AND ANAEROBIC Blood Culture results may not be optimal due to an  inadequate volume of blood received in culture bottles   Culture   Final    NO GROWTH 3 DAYS Performed at [facility], [place] [address] [place], [place] [address]    Report Status PENDING  Incomplete  Respiratory Panel by RT PCR (Flu A&B, Covid) - Nasopharyngeal Swab     Status: None   Collection Time: 10/04/19  1:00 AM   Specimen: Nasopharyngeal Swab  Result Value Ref Range Status   SARS Coronavirus 2 by RT PCR NEGATIVE NEGATIVE Final  Comment: (NOTE) SARS-CoV-2 target nucleic acids are NOT DETECTED. The SARS-CoV-2 RNA is generally detectable in upper respiratoy specimens during the acute phase of infection. The lowest concentration of SARS-CoV-2 viral copies this assay can detect is 131 copies/mL. A negative result does not preclude SARS-Cov-2 infection and should not be used as the sole basis for treatment or other patient management decisions. A negative result may occur with  improper specimen collection/handling, submission of specimen other than nasopharyngeal swab, presence of viral mutation(s) within the areas targeted by this assay, and inadequate number of viral copies (<131 copies/mL). A negative result must be combined with clinical observations, patient history, and epidemiological information. The expected result is Negative. Fact Sheet for Patients:  https://www.moore.com/ Fact Sheet for Healthcare Providers:  https://www.young.biz/ This test is not yet ap proved or cleared by the [place] FDA and  has been authorized for detection and/or diagnosis of SARS-CoV-2 by FDA under an Emergency Use Authorization (EUA). This EUA will remain  in effect (meaning this test can be used) for the duration of the COVID-19 declaration under Section 564(b)(1) of the Act, 21 U.S.C. section 360bbb-3(b)(1), unless the authorization is terminated or revoked sooner.    Influenza A by PCR NEGATIVE NEGATIVE Final   Influenza B by PCR  NEGATIVE NEGATIVE Final    Comment: (NOTE) The Xpert Xpress SARS-CoV-2/FLU/RSV assay is intended as an aid in  the diagnosis of influenza from Nasopharyngeal swab specimens and  should not be used as a sole basis for treatment. Nasal washings and  aspirates are unacceptable for Xpert Xpress SARS-CoV-2/FLU/RSV  testing. Fact Sheet for Patients: https://www.moore.com/ Fact Sheet for Healthcare Providers: https://www.young.biz/ This test is not yet approved or cleared by the [place] FDA and  has been authorized for detection and/or diagnosis of SARS-CoV-2 by  FDA under an Emergency Use Authorization (EUA). This EUA will remain  in effect (meaning this test can be used) for the duration of the  Covid-19 declaration under Section 564(b)(1) of the Act, 21  U.S.C. section 360bbb-3(b)(1), unless the authorization is  terminated or revoked. Performed at [facility], [address], [place], [place] [address]     Procedures/Studies: CT Head Wo Contrast  Result Date: 10/03/2019 CLINICAL DATA:  Dizziness and weakness for 1 hour EXAM: CT HEAD WITHOUT CONTRAST TECHNIQUE: Contiguous axial images were obtained from the base of the skull through the vertex without intravenous contrast. COMPARISON:  07/02/2019 FINDINGS: Brain: No evidence of acute infarction, hemorrhage, hydrocephalus, extra-axial collection or mass lesion/mass effect. Diffuse atrophic and chronic white matter ischemic changes are again identified. Vascular: No hyperdense vessel or unexpected calcification. Skull: No focal fracture is noted. Heterogeneous attenuation of the skull is again identified similar to that noted on the prior exam. Sinuses/Orbits: No acute finding. Other: None. IMPRESSION: Chronic atrophic and ischemic changes stable from the prior exam. No acute abnormality noted. Electronically Signed   By: Alcide Clever M.D.   On: 10/03/2019 23:35   CARDIAC EVENT MONITOR  Result  Date: 09/30/2019  Predominantly sinus rhythm with PACs. (Rates from 51-113 bpm). Average heart rate for 65 bpm.  24% A. fib with variable rates 50 to 252 bpm..  Rare PACs and PVCs noted.  10 manually detected episodes-6 in sinus rhythm with PACs, 4 with A. fib (not RVR)   Roughly 24% A. fib with rates running from 50 to 150 bpm.  Otherwise sinus rhythm with PACs.  No significant pauses noted.  Several short bursts of 3-4 beats PVCs versus  aberrantly conducted PACs.  Symptoms noted for relatively benign findings of sinus rhythm with PACs or stable A. fib.  Not with rapid A. Fib. Bryan Lemma, MD  DG Chest Port 1 View  Result Date: 10/03/2019 CLINICAL DATA:  Syncope. Dizziness and weakness. EXAM: PORTABLE CHEST 1 VIEW COMPARISON:  Radiograph 12/30/2017 FINDINGS: Significant patient rotation. Low lung volumes, similar to prior exam. Mild cardiomegaly is grossly unchanged allowing for rotation. Aortic atherosclerosis. Implanted loop recorder in the left chest wall. No acute airspace disease, pulmonary edema, pneumothorax or large pleural effusion. Remote left clavicle fracture. Remote right rib fracture. IMPRESSION: Low lung volumes without acute abnormality. Aortic Atherosclerosis (ICD10-I70.0). Electronically Signed   By: Narda Rutherford M.D.   On: 10/03/2019 21:42   CT Angio Chest/Abd/Pel for Dissection W and/or Wo Contrast  Result Date: 10/03/2019 CLINICAL DATA:  Chest pain or back pain, aortic dissection suspected EXAM: CT ANGIOGRAPHY CHEST, ABDOMEN AND PELVIS TECHNIQUE: Multidetector CT imaging through the chest, abdomen and pelvis was performed using the standard protocol during bolus administration of intravenous contrast. Multiplanar reconstructed images and MIPs were obtained and reviewed to evaluate the vascular anatomy. CONTRAST:  80mL OMNIPAQUE IOHEXOL 350 MG/ML SOLN. 100 cc Omnipaque 350 IV extravasated in the left upper extremity, evaluated by non interpreting radiologist. COMPARISON:   Radiograph earlier this day. FINDINGS: CTA CHEST FINDINGS Cardiovascular: Thoracic aorta is normal in caliber without dissection, aortic hematoma, or aneurysm. Moderate aortic atherosclerosis. The left subclavian artery is occluded at its origin, reconstituted approximately 2 cm distally. No pulmonary embolus to the lobar level. Heart is normal in size. There are coronary artery calcifications. No pericardial effusion. Mediastinum/Nodes: Small mediastinal and hilar lymph nodes. No pathologic adenopathy. No esophageal wall thickening. No visualized thyroid nodule. Lungs/Pleura: 14 mm serpiginous tubular structure in the anterior right upper lobe, series 11 image 60, likely a small AVM. There is an additional tubular serpiginous lesion in the right lower lobe measuring approximately 2 cm, also likely a small AVM. No confluent airspace disease. No evidence of pulmonary edema. No pleural fluid. Musculoskeletal: Extravasated IV contrast within the musculature of the left upper extremity. Severe compression fracture of T9 and T11, technically age indeterminate but likely chronic. Remote sternal fracture. Implanted loop recorder in the left chest wall. Remote posterior right rib fracture. Review of the MIP images confirms the above findings. CTA ABDOMEN AND PELVIS FINDINGS VASCULAR Aorta: Moderate atherosclerosis. No dissection, aneurysm, or evidence of vasculitis. Celiac: Splenic and hepatic arteries with separate origins from the aorta. Plaque at the origin of the common hepatic artery causes mild stenosis with poststenotic dilatation. SMA: Plaque at the origin but no significant stenosis. No dissection or evidence of vasculitis. Distal branch vessels are patent. Renals: Plaque at the origin of both renal arteries, left greater than right. No dissection or aneurysm. No evidence of vasculitis. IMA: Patent. Inflow: Patent without aneurysm, dissection, vasculitis or severe stenosis. Moderate atherosclerosis. Veins: No  obvious venous abnormality within the limitations of this arterial phase study. Review of the MIP images confirms the above findings. NON-VASCULAR Hepatobiliary: Minimal contrast refluxes into the hepatic veins and IVC. No focal hepatic lesion. Gallbladder is unremarkable. No biliary dilatation. Pancreas: No ductal dilatation or inflammation. Spleen: Normal in size and arterial enhancement. Adrenals/Urinary Tract: Mild left adrenal thickening without dominant nodule. Normal right adrenal gland. No hydronephrosis. No perinephric edema. Mild cortical scarring in the upper left kidney. Small bilateral renal cysts. Early excretion of IV contrast in both renal collecting systems likely from extravasated contrast material. Excreted  IV contrast in the urinary bladder which is otherwise unremarkable. Stomach/Bowel: Stomach is nondistended. No small bowel wall thickening, inflammation or obstruction. Equivocal mild wall thickening of the ascending colon without pericolonic edema. Colonic diverticulosis without evidence of diverticulitis. Lymphatic: No adenopathy. Reproductive: Status post hysterectomy. No adnexal masses. Other: No free air, free fluid, or intra-abdominal fluid collection. Musculoskeletal: Mild compression fracture of L1 and L2, moderate compression fracture of L4. These are technically age indeterminate but likely chronic. Bones are diffusely under mineralized. Review of the MIP images confirms the above findings. IMPRESSION: CTA CHEST 1. No aortic dissection or acute aortic abnormality. Moderate aortic atherosclerosis. 2. 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 3. Probable small pulmonary AVM's in the right upper and right lower lobes (1.4 cm in 2.0 cm respectively). No prior exams to evaluate for comparison. Consider follow-up chest CT with contrast in 6 months to evaluate for stability. 4. Coronary artery calcifications. 5. Equivocal mild wall  thickening of the ascending colon without pericolonic edema. This may be secondary to colitis. 6. Compression fractures of T9, T11, L1, L2, and L4, technically age indeterminate but likely chronic. 7. Colonic diverticulosis without diverticulitis. 8. IV contrast extravasation in the left upper extremity. Aortic Atherosclerosis (ICD10-I70.0). Electronically Signed   By: Narda Rutherford M.D.   On: 10/03/2019 23:49   VAS US CAROTID  Result Date: 10/05/2019 Carotid Arterial Duplex Study Indications:       Syncope and Dizziness. Risk Factors:      Left subclavian artery stenosis at origin by CTA.                    Hypertension, hyperlipidemia, coronary artery disease. Other Factors:     Paroxysmal atrial fibrillation, sick sinus syndrome/. Comparison Study:  No prior study on file for comparison Performing Technologist: Sherren Kerns RVS  Examination Guidelines: A complete evaluation includes B-mode imaging, spectral Doppler, color Doppler, and power Doppler as needed of all accessible portions of each vessel. Bilateral testing is considered an integral part of a complete examination. Limited examinations for reoccurring indications may be performed as noted.  Right Carotid Findings: +----------+--------+--------+--------+------------------+------------------+           PSV cm/sEDV cm/sStenosisPlaque DescriptionComments           +----------+--------+--------+--------+------------------+------------------+ CCA Prox  88      8                                 intimal thickening +----------+--------+--------+--------+------------------+------------------+ CCA Distal72      12                                intimal thickening +----------+--------+--------+--------+------------------+------------------+ ICA Prox  113     24              heterogenous                         +----------+--------+--------+--------+------------------+------------------+ ICA Distal118     21                                 tortuous           +----------+--------+--------+--------+------------------+------------------+ ECA       95      6                                                    +----------+--------+--------+--------+------------------+------------------+ +----------+--------+-------+--------+-------------------+  PSV cm/sEDV cmsDescribeArm Pressure (mmHG) +----------+--------+-------+--------+-------------------+ Subclavian170                                        +----------+--------+-------+--------+-------------------+ +---------+--------+--+--------+--+ VertebralPSV cm/s83EDV cm/s28 +---------+--------+--+--------+--+  Left Carotid Findings: +----------+--------+--------+--------+------------------+------------------+           PSV cm/sEDV cm/sStenosisPlaque DescriptionComments           +----------+--------+--------+--------+------------------+------------------+ CCA Prox  83      14                                intimal thickening +----------+--------+--------+--------+------------------+------------------+ CCA Distal79      14                                intimal thickening +----------+--------+--------+--------+------------------+------------------+ ICA Prox  143     29              calcific          Shadowing          +----------+--------+--------+--------+------------------+------------------+ ICA Distal123     20                                tortuous           +----------+--------+--------+--------+------------------+------------------+ ECA       96      8                                                    +----------+--------+--------+--------+------------------+------------------+ +----------+--------+--------+--------+-------------------+           PSV cm/sEDV cm/sDescribeArm Pressure (mmHG) +----------+--------+--------+--------+-------------------+ [id number]                                           +----------+--------+--------+--------+-------------------+ +---------+--------+--+--------+-+ VertebralPSV cm/s85EDV cm/s4 +---------+--------+--+--------+-+  Summary: Right Carotid: Velocities in the right ICA are consistent with a 1-39% stenosis. Left Carotid: Velocities in the left ICA are consistent with a 1-39% stenosis. Vertebrals:  Bilateral vertebral arteries demonstrate antegrade flow. Subclavians: Normal flow hemodynamics were seen in bilateral subclavian              arteries. *See table(s) above for measurements and observations.  Electronically signed by Delia Heady MD on 10/05/2019 at 10:30:48 AM.    Final     Labs: BNP (last 3 results) Recent Labs    10/04/19 0019  BNP 295.1*   Basic Metabolic Panel: Recent Labs  Lab 10/03/19 2024 10/07/19 0403  NA 133* 139  K 3.9 3.4*  CL 96* 106  CO2 25 23  GLUCOSE 145* 90  BUN 12 10  CREATININE 0.75 0.63  CALCIUM 9.0 8.1*  MG 2.1  --    Liver Function Tests: No results for input(s): AST, ALT, ALKPHOS, BILITOT, PROT, ALBUMIN in the last 168 hours. No results for input(s): LIPASE, AMYLASE in the last 168 hours. Recent Labs  Lab 10/04/19 0648  AMMONIA 10   CBC: Recent Labs  Lab 10/04/19 0019 10/07/19 0403  WBC 11.9* 5.8  NEUTROABS 11.0*  --   HGB 11.8* 10.0*  HCT 37.1 30.7*  MCV 94.6 92.2  PLT 364 376   Cardiac Enzymes: No results for input(s): CKTOTAL, CKMB, CKMBINDEX, TROPONINI in the last 168 hours. BNP: Invalid input(s): POCBNP CBG: No results for input(s): GLUCAP in the last 168 hours. D-Dimer No results for input(s): DDIMER in the last 72 hours. Hgb A1c No results for input(s): HGBA1C in the last 72 hours. Lipid Profile No results for input(s): CHOL, HDL, LDLCALC, TRIG, CHOLHDL, LDLDIRECT in the last 72 hours. Thyroid function studies No results for input(s): TSH, T4TOTAL, T3FREE, THYROIDAB in the last 72 hours.  Invalid input(s): FREET3 Anemia work up No results for input(s): VITAMINB12,  FOLATE, FERRITIN, TIBC, IRON, RETICCTPCT in the last 72 hours. Urinalysis    Component Value Date/Time   COLORURINE YELLOW 10/06/2019 0204   APPEARANCEUR CLEAR 10/06/2019 0204   LABSPEC 1.023 10/06/2019 0204   PHURINE 5.0 10/06/2019 0204   GLUCOSEU NEGATIVE 10/06/2019 0204   HGBUR NEGATIVE 10/06/2019 0204   BILIRUBINUR NEGATIVE 10/06/2019 0204   KETONESUR NEGATIVE 10/06/2019 0204   PROTEINUR NEGATIVE 10/06/2019 0204   NITRITE NEGATIVE 10/06/2019 0204   LEUKOCYTESUR MODERATE (A) 10/06/2019 0204   Sepsis Labs Invalid input(s): PROCALCITONIN,  WBC,  LACTICIDVEN Microbiology Recent Results (from the past 240 hour(s))  SARS CORONAVIRUS 2 (TAT 6-24 HRS) Nasopharyngeal Nasopharyngeal Swab     Status: None   Collection Time: 10/03/19  8:53 PM   Specimen: Nasopharyngeal Swab  Result Value Ref Range Status   SARS Coronavirus 2 NEGATIVE NEGATIVE Final    Comment: (NOTE) SARS-CoV-2 target nucleic acids are NOT DETECTED. The SARS-CoV-2 RNA is generally detectable in upper and lower respiratory specimens during the acute phase of infection. Negative results do not preclude SARS-CoV-2 infection, do not rule out co-infections with other pathogens, and should not be used as the sole basis for treatment or other patient management decisions. Negative results must be combined with clinical observations, patient history, and epidemiological information. The expected result is Negative. Fact Sheet for Patients: HairSlick.no Fact Sheet for Healthcare Providers: quierodirigir.com This test is not yet approved or cleared by the [place] FDA and  has been authorized for detection and/or diagnosis of SARS-CoV-2 by FDA under an Emergency Use Authorization (EUA). This EUA will remain  in effect (meaning this test can be used) for the duration of the COVID-19 declaration under Section 56 4(b)(1) of the Act, 21 U.S.C. section 360bbb-3(b)(1),  unless the authorization is terminated or revoked sooner. Performed at [facility], [address], [place], [place] [address]   Culture, blood (routine x 2)     Status: None (Preliminary result)   Collection Time: 10/04/19 12:19 AM   Specimen: BLOOD  Result Value Ref Range Status   Specimen Description BLOOD RIGHT HAND  Final   Special Requests   Final    BOTTLES DRAWN AEROBIC AND ANAEROBIC Blood Culture results may not be optimal due to an inadequate volume of blood received in culture bottles   Culture   Final    NO GROWTH 3 DAYS Performed at [facility], [address], [place], [place] [address]    Report Status PENDING  Incomplete  Respiratory Panel by RT PCR (Flu A&B, Covid) - Nasopharyngeal Swab     Status: None   Collection Time: 10/04/19  1:00 AM   Specimen: Nasopharyngeal Swab  Result Value Ref Range Status   SARS Coronavirus 2 by RT PCR NEGATIVE NEGATIVE Final  Comment: (NOTE) SARS-CoV-2 target nucleic acids are NOT DETECTED. The SARS-CoV-2 RNA is generally detectable in upper respiratoy specimens during the acute phase of infection. The lowest concentration of SARS-CoV-2 viral copies this assay can detect is 131 copies/mL. A negative result does not preclude SARS-Cov-2 infection and should not be used as the sole basis for treatment or other patient management decisions. A negative result may occur with  improper specimen collection/handling, submission of specimen other than nasopharyngeal swab, presence of viral mutation(s) within the areas targeted by this assay, and inadequate number of viral copies (<131 copies/mL). A negative result must be combined with clinical observations, patient history, and epidemiological information. The expected result is Negative. Fact Sheet for Patients:  https://www.moore.com/https://www.fda.gov/media/142436/download Fact Sheet for Healthcare Providers:  https://www.young.biz/https://www.fda.gov/media/142435/download This test is not yet ap proved or  cleared by the [place] FDA and  has been authorized for detection and/or diagnosis of SARS-CoV-2 by FDA under an Emergency Use Authorization (EUA). This EUA will remain  in effect (meaning this test can be used) for the duration of the COVID-19 declaration under Section 564(b)(1) of the Act, 21 U.S.C. section 360bbb-3(b)(1), unless the authorization is terminated or revoked sooner.    Influenza A by PCR NEGATIVE NEGATIVE Final   Influenza B by PCR NEGATIVE NEGATIVE Final    Comment: (NOTE) The Xpert Xpress SARS-CoV-2/FLU/RSV assay is intended as an aid in  the diagnosis of influenza from Nasopharyngeal swab specimens and  should not be used as a sole basis for treatment. Nasal washings and  aspirates are unacceptable for Xpert Xpress SARS-CoV-2/FLU/RSV  testing. Fact Sheet for Patients: https://www.moore.com/https://www.fda.gov/media/142436/download Fact Sheet for Healthcare Providers: https://www.young.biz/https://www.fda.gov/media/142435/download This test is not yet approved or cleared by the [place] FDA and  has been authorized for detection and/or diagnosis of SARS-CoV-2 by  FDA under an Emergency Use Authorization (EUA). This EUA will remain  in effect (meaning this test can be used) for the duration of the  Covid-19 declaration under Section 564(b)(1) of the Act, 21  U.S.C. section 360bbb-3(b)(1), unless the authorization is  terminated or revoked. Performed at [facility], [address] [place], [place] [address]      Time coordinating discharge:  25 minutes  SIGNED: Lanae Boastamesh Tyniya Kuyper, MD  [facility] 10/07/2019, 11:40 AM  If 7PM-7AM, please contact night-coverage [email or web address]

## 2019-10-07 NOTE — Progress Notes (Signed)
Physical Therapy Treatment Patient Details Name: Deborah [name] MRN: [id number] DOB: [birth year] Today's Date: 10/07/2019    History of Present Illness 86 y.o. female with medical history significant of paroxysmal A. fib, sick sinus syndrome, CAD status post PCI, chronic diastolic congestive heart failure, hypertension, hyperlipidemia, hypothyroidism, vasovagal syncope, vertigo presenting to the ED for evaluation of dizziness and syncope.    PT Comments    Patient received in recliner, very pleasant but perseverating on her husband, asking if he was here yet and where he was. Able to complete all mobility today with SPC and min guard, shows improved balance and mobility overall and tolerated progression of gait distance with no significant unsteadiness but did require VC for navigation in hallway. Able to toilet and performed pericare with S, then was left up in the recliner with chair alarm active and RN Present and attending. Progressing well with PT.     Follow Up Recommendations  Supervision/Assistance - 24 hour;Home health PT     Equipment Recommendations  Rolling walker with 5" wheels    Recommendations for Other Services       Precautions / Restrictions Precautions Precautions: Fall Restrictions Weight Bearing Restrictions: No    Mobility  Bed Mobility               General bed mobility comments: OOB in chair upon entry  Transfers Overall transfer level: Needs assistance Equipment used: Straight cane Transfers: Sit to/from Stand Sit to Stand: Min guard         General transfer comment: min guard, cues for hand placement and assist with lines  Ambulation/Gait Ambulation/Gait assistance: Min guard Gait Distance (Feet): 120 Feet Assistive device: Straight cane Gait Pattern/deviations: Step-through pattern;Decreased step length - right;Decreased step length - left;Trunk flexed;Drifts right/left Gait velocity: decreased   General Gait Details: balance and  mobility much improved today, continues with short step lengths/flexed posture; min guard only for safety, no physical assist given for balance   Stairs             Wheelchair Mobility    Modified Rankin (Stroke Patients Only)       Balance Overall balance assessment: Needs assistance Sitting-balance support: Bilateral upper extremity supported;Feet supported Sitting balance-Leahy Scale: Good     Standing balance support: Single extremity supported Standing balance-Leahy Scale: Fair Standing balance comment: reliant on external support                            Cognition Arousal/Alertness: Awake/alert Behavior During Therapy: WFL for tasks assessed/performed Overall Cognitive Status: Impaired/Different from baseline Area of Impairment: Orientation;Attention;Memory;Following commands;Safety/judgement;Problem solving                 Orientation Level: Disoriented to;Time;Situation Current Attention Level: Alternating Memory: Decreased short-term memory Following Commands: Follows one step commands with increased time Safety/Judgement: Decreased awareness of safety;Decreased awareness of deficits   Problem Solving: Slow processing;Requires verbal cues;Requires tactile cues General Comments: pleasantly confused and perseverating on if her husband is here yet/when he is coming      Exercises      General Comments        Pertinent Vitals/Pain Pain Assessment: No/denies pain Faces Pain Scale: No hurt Pain Intervention(s): Limited activity within patient's tolerance;Monitored during session    Home Living                      Prior Function  PT Goals (current goals can now be found in the care plan section) Acute Rehab PT Goals Patient Stated Goal: go home PT Goal Formulation: With patient/family Time For Goal Achievement: 10/18/19 Potential to Achieve Goals: Fair Progress towards PT goals: Progressing toward goals     Frequency    Min 3X/week      PT Plan      Co-evaluation              AM-PAC PT "6 Clicks" Mobility   Outcome Measure  Help needed turning from your back to your side while in a flat bed without using bedrails?: A Little Help needed moving from lying on your back to sitting on the side of a flat bed without using bedrails?: A Little Help needed moving to and from a bed to a chair (including a wheelchair)?: A Little Help needed standing up from a chair using your arms (e.g., wheelchair or bedside chair)?: A Little Help needed to walk in hospital room?: A Little Help needed climbing 3-5 steps with a railing? : A Lot 6 Click Score: 17    End of Session Equipment Utilized During Treatment: Gait belt Activity Tolerance: Patient tolerated treatment well Patient left: with call bell/phone within reach;in chair;with chair alarm set Nurse Communication: Mobility status PT Visit Diagnosis: Muscle weakness (generalized) (M62.81);Other abnormalities of gait and mobility (R26.89)     Time: 0940-1003 PT Time Calculation (min) (ACUTE ONLY): 23 min  Charges:  $Gait Training: 8-22 mins $Therapeutic Activity: 8-22 mins                     Madelaine Etienne, DPT, PN1   Supplemental Physical Therapist [facility]    Pager [phone] Acute Rehab Office [phone]

## 2019-10-07 NOTE — Procedures (Signed)
Patient Name: Finlee Concepcion  MRN: [id number]  Epilepsy Attending: Charlsie Quest  Referring Physician/Provider: Dr. Lanae Boast Date: 10/06/2019  Duration: 22.42 minutes  Patient history: 86 year old female who presented to the ED for evaluation of dizziness and syncope.  EEG to evaluate for seizures.  Level of alertness: Awake  AEDs during EEG study: None  Technical aspects: This EEG study was done with scalp electrodes positioned according to the 10-20 International system of electrode placement. Electrical activity was acquired at a sampling rate of 500Hz  and reviewed with a high frequency filter of 70Hz  and a low frequency filter of 1Hz . EEG data were recorded continuously and digitally stored.   DESCRIPTION: The posterior dominant rhythm consists of 8-9 Hz activity of moderate voltage (25-35 uV) seen predominantly in posterior head regions, symmetric and reactive to eye opening and eye closing. Intermittent 5-6hz  theta slowing was seen in right temporal region which is likely theta slowing of elderly. Hyperventilation and photic stimulation were not performed.  IMPRESSION: This study is within normal limits.No seizures or epileptiform discharges were seen throughout the recording.  Joellen Tullos 

## 2019-10-07 NOTE — TOC Initial Note (Signed)
Transition of Care [facility]) - Initial/Assessment Note    Patient Details  Name: Deborah [name] MRN: [id number] Date of Birth: Sep 06, 1931  Transition of Care [facility]) CM/SW Contact:    Gala Lewandowsky, RN Phone Number: 10/07/2019, 1:23 PM  Clinical Narrative:  Patient presented for Syncope. From home with family support. Recommendations for home with Home Health-family agreeable to use [facility]-had used the agency in the past. Referral called to [facility] and they cannot service the patient based on staffing-Family aware. Case Manager did place a call to [facility] to see if they can service- awaiting call back.                  Expected Discharge Plan: Home w Home Health Services Barriers to Discharge: No Barriers Identified   Patient Goals and CMS Choice Patient states their goals for this hospitalization and ongoing recovery are:: "to return home"   Choice offered to / list presented to : Spouse(Family had used [facility] in the past and wants to use again- no list needed.)  Expected Discharge Plan and Services Expected Discharge Plan: Home w Home Health Services In-house Referral: NA Discharge Planning Services: CM Consult Post Acute Care Choice: Home Health Living arrangements for the past 2 months: Single Family Home Expected Discharge Date: 10/07/19                  HH Arranged: RN, Disease Management, PT HH Agency: [facility] Date [facility] Agency Contacted: 10/07/19 Time HH Agency Contacted: 1320 Representative spoke with at [facility] Agency: Lolita Patella  Prior Living Arrangements/Services Living arrangements for the past 2 months: Single Family Home Lives with:: Spouse Patient language and need for interpreter reviewed:: Yes Do you feel safe going back to the place where you live?: Yes      Need for Family Participation in Patient Care: Yes (Comment) Care giver support system in place?: Yes (comment)   Criminal  Activity/Legal Involvement Pertinent to Current Situation/Hospitalization: No - Comment as needed  Activities of Daily Living Home Assistive Devices/Equipment: Cane (specify quad or straight) ADL Screening (condition at time of admission) Patient's cognitive ability adequate to safely complete daily activities?: No Is the patient deaf or have difficulty hearing?: No Does the patient have difficulty seeing, even when wearing glasses/contacts?: No Does the patient have difficulty concentrating, remembering, or making decisions?: Yes Patient able to express need for assistance with ADLs?: Yes Does the patient have difficulty dressing or bathing?: Yes Independently performs ADLs?: No Communication: Needs assistance Is this a change from baseline?: Pre-admission baseline Dressing (OT): Needs assistance Is this a change from baseline?: Pre-admission baseline Grooming: Needs assistance Is this a change from baseline?: Pre-admission baseline Feeding: Needs assistance Is this a change from baseline?: Pre-admission baseline Bathing: Needs assistance Is this a change from baseline?: Pre-admission baseline Toileting: Needs assistance Is this a change from baseline?: Pre-admission baseline In/Out Bed: Needs assistance Is this a change from baseline?: Pre-admission baseline Walks in Home: Needs assistance Is this a change from baseline?: Pre-admission baseline Does the patient have difficulty walking or climbing stairs?: Yes Weakness of Legs: Both Weakness of Arms/Hands: Both  Permission Sought/Granted Permission sought to share information with : Family Supports, Oceanographer granted to share information with : Yes, Verbal Permission Granted     Permission granted to share info w AGENCY: [facility]: [facility]        Emotional Assessment Appearance:: Appears stated age  Attitude/Demeanor/Rapport: Engaged Affect (typically observed):  Appropriate Orientation: : Oriented to Self Alcohol / Substance Use: Not Applicable Psych Involvement: No (comment)  Admission diagnosis:  Paroxysmal atrial fibrillation ([place]) [I48.0] Atrial fibrillation with rapid ventricular response ([facility]) [I48.91] Hypotension, unspecified hypotension type [I95.9] Syncope, unspecified syncope type [R55] Atrial fibrillation with RVR ([place]) [I48.91] Patient Active Problem List   Diagnosis Date Noted  . Syncope 10/04/2019  . Irregular heart rate 10/04/2019  . Hypotension 10/04/2019  . Generalized weakness 10/04/2019  . AMS (altered mental status) 10/04/2019  . Atrial fibrillation with RVR ([place]) 10/04/2019  . Encounter for screening for COVID-19 09/25/2019  . Leg swelling 01/13/2019  . Vertigo 12/31/2017  . Bleeding into the skin 10/22/2017  . Tremor 07/03/2017  . Near syncope 07/03/2017  . Vasovagal syncope   . Hyperlipidemia with target LDL less than 70   . SSS (sick sinus syndrome) ([place]) 07/31/2016  . CAD S/P DES PCI to RCA 07/31/2016  . Anemia 07/31/2016  . Essential hypertension 07/13/2016  . Hypothyroidism 07/13/2016  . Coronary artery disease involving native heart without angina pectoris 12/02/2014  . Mitral regurgitation and aortic stenosis 09/30/2014  . Thoracic aortic atherosclerosis ([place]) 09/18/2014  . Chronic diastolic heart failure due to valvular disease ([place]) 09/18/2013  . Paroxysmal atrial fibrillation [facility]): CHA2DS2Vasc = 6; Not currently on [facility]. 09/18/2012   PCP:  Dorothyann Peng, NP Pharmacy:   [facility] [place] [address] Phone: [phone] Fax: [facility] [place] [place] - [address] [place] [address] Phone: [phone] Fax: [phone]  [id number] [place], [place] [address] Phone: [phone] Fax:  [phone]     Social Determinants of Health (SDOH) Interventions    Readmission Risk Interventions No flowsheet data found.

## 2019-10-08 ENCOUNTER — Encounter: Payer: Self-pay | Admitting: Adult Health

## 2019-10-08 ENCOUNTER — Other Ambulatory Visit: Payer: Self-pay | Admitting: Adult Health

## 2019-10-08 ENCOUNTER — Telehealth: Payer: Self-pay | Admitting: *Deleted

## 2019-10-08 NOTE — Telephone Encounter (Signed)
Attempted to contact patient. No answer LVM for return call.

## 2019-10-09 LAB — CULTURE, BLOOD (ROUTINE X 2): Culture: NO GROWTH

## 2019-10-10 ENCOUNTER — Encounter: Payer: Self-pay | Admitting: Cardiology

## 2019-10-10 ENCOUNTER — Ambulatory Visit: Payer: Medicare HMO | Admitting: Cardiology

## 2019-10-10 ENCOUNTER — Other Ambulatory Visit: Payer: Self-pay

## 2019-10-10 VITALS — BP 163/64 | HR 69 | Ht 60.0 in | Wt 115.2 lb

## 2019-10-10 DIAGNOSIS — I080 Rheumatic disorders of both mitral and aortic valves: Secondary | ICD-10-CM

## 2019-10-10 DIAGNOSIS — I2510 Atherosclerotic heart disease of native coronary artery without angina pectoris: Secondary | ICD-10-CM

## 2019-10-10 DIAGNOSIS — Z515 Encounter for palliative care: Secondary | ICD-10-CM | POA: Diagnosis not present

## 2019-10-10 DIAGNOSIS — I480 Paroxysmal atrial fibrillation: Secondary | ICD-10-CM

## 2019-10-10 MED ORDER — AMIODARONE HCL 100 MG PO TABS: ORAL_TABLET | ORAL | 1 refills | Status: DC

## 2019-10-10 NOTE — Patient Instructions (Signed)
Medication Instructions:  Take current dose of amiodarone until Feb 14 After Feb 14, decrease amiodarone to 100mg  daily Continue other current medications   *If you need a refill on your cardiac medications before your next appointment, please call your pharmacy*   Follow-Up: At [facility], you and your health needs are our priority.  As part of our continuing mission to provide you with exceptional heart care, we have created designated Provider Care Teams.  These Care Teams include your primary Cardiologist (physician) and Advanced Practice Providers (APPs -  Physician Assistants and Nurse Practitioners) who all work together to provide you with the care you need, when you need it.  Your next appointment:   3 month(s)  The format for your next appointment:   Virtual Visit   Provider:   [facility], MD  Other Instructions  Dr. Bryan Lemma recommends boost, ensure, or carnation instant breakfast

## 2019-10-10 NOTE — Progress Notes (Signed)
Primary Care Provider: Shirline Frees, NP Cardiologist: Bryan Lemma, MD Electrophysiologist:   Clinic Note: Chief Complaint  Patient presents with  . Hospitalization Follow-up  . Loss of Consciousness  . Atrial Fibrillation    HPI:    Deborah [name] is a 86 y.o. female with a PMH notable for CAD and PCI, borderline severe MR along with HFpEF and PAF (tachybradycardia) who presents today for hospital follow-up.  Deborah [name] was last seen via telemedicine on 09/25/2019 -> this is follow-up of 30-day event monitor showing 24% A. fib with PACs and PVCs.  She was doing relatively well.  As usual her husband Deborah [name] provides most of the information.  No significant change in symptoms.  Is doing relatively well being off of medications./  Recent Hospitalizations:   January 15-19, 2021 admitted for for dizziness weakness and fatigue with poor appetite.  Noted to be in A. fib RVR.  Became hypotensive with diltiazem.  Was bolused with IV fluids.  Was started on amiodarone to try to maintain sinus rhythm.  Discharged on 20 mg daily amiodarone.  Reviewed  CV studies:    The following studies were reviewed today: (if available, images/films reviewed: From Epic Chart or Care Everywhere) . Carotid Dopplers 10/05/2019: 1-39% bilateral internal carotids.  Normal vertebral and subclavian arteries.   Interval History:   Deborah [name] returns here today with her husband Deborah [name], noting that she actually feels better since discharge is still somewhat tremulous and fatigue.  She really had one of her syncopal episodes that led to her hospitalization.  It is unclear what the true etiology was.  Carotid Dopplers were negative.  It is possible that with her MR combined with A. fib RVR that her pressures would have dropped enough for her to have a syncopal episode.  She is noticing a little bit of nausea and fatigue, but notable decrease in palpitations.  No real anginal symptoms or active heart failure  symptoms.  CV Review of Symptoms (Summary): positive for - dyspnea on exertion and palpitations negative for - chest pain, edema, irregular heartbeat, orthopnea, paroxysmal nocturnal dyspnea, rapid heart rate, shortness of breath or Syncope/near syncope since discharge.  Has had dizziness and fatigue.  No TIA or amaurosis fugax.  The patient does not have symptoms concerning for COVID-19 infection (fever, chills, cough, or new shortness of breath).  The patient is practicing social distancing and masking.  REVIEWED OF SYSTEMS   A comprehensive ROS was performed. Review of Systems  Constitutional: Positive for malaise/fatigue. Negative for weight loss.  HENT: Negative for nosebleeds.   Respiratory: Negative for cough, shortness of breath and wheezing.   Gastrointestinal: Negative for blood in stool and melena.  Genitourinary: Negative for hematuria.  Musculoskeletal: Positive for falls. Negative for joint pain.  Neurological: Positive for dizziness and weakness. Negative for loss of consciousness (None since discharge).  Psychiatric/Behavioral: Positive for memory loss. The patient has insomnia.    I have reviewed and (if needed) personally updated the patient's problem list, medications, allergies, past medical and surgical history, social and family history.   PAST MEDICAL HISTORY   Past Medical History:  Diagnosis Date  . Anxiety   . Bradycardia   . CAD S/P percutaneous coronary angioplasty 11/2014   DES PCI to RCA. Cath was done for pre-operative evaluation for mitral repair; LM 30%, LAD 50%, RCA 90% (Promus DES)  . 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 Normal EF by echo.  [name]  Diverticulitis   . Essential hypertension   . H/O: upper GI bleed 12/03/2014   While on ASA/Plavix & Warfarin --> Hct dropped to 18%; small AVM noted but no overt pathology.  [name] History of uterine cancer 2001   Status post hysterectomy    . Hyperlipidemia with target LDL less than 70    Coronary disease and thoracic aortic atheroma  . Hypothyroidism    On Levothyroxine  . Paroxysmal atrial fibrillation (HCC) 2014   Associated with sick sinus syndrome. -- Intolerant of beta blockers and diltiazem. Rate control with digoxin. Not on anticoagulation despite CHA2DS2Vasc 6  2/2 prior severe GI bleed on triple therapy  . PFO (patent foramen ovale)    Noted on TEE - L-R shunt (elsewhere reported it as a ASD)  . Severe mitral regurgitation by prior echocardiogram 09/2014   Seen on TEE to have severe MR with multiple jets. Vena contracted 0.8; Consideration had been ? Mitral E-Clip - put on hold after GI Bleed.  . Sick sinus syndrome (HCC)    Status post Loop Recorder: Medtr[employer] Linq -- most recent interrogation 05/19/2016: 2 new pauses. One greater than 3 seconds at 0 320 the morning. Second was 4.4 seconds at 9:56 PM; also noted to have an episode of A. fib. --> Recommended further follow-up upon arrival to [facility]  . Thoracic aortic atherosclerosis (HCC) 09/2014   Moderate grade 3-4 atheroma noted on TEE  . Urinary tract infection   . Vasovagal syncope    Exacerbated by bradycardia, sick sinus syndrome, and mitral regurgitation.  . Vertigo, central, unspecified laterality    Chronic dizziness.    PAST SURGICAL HISTORY   Past Surgical History:  Procedure Laterality Date  . ABDOMINAL HYSTERECTOMY  2001  . Cataract Surgery  Bilateral   . COLONOSCOPY W/ BIOPSIES  2006   2 polyps noted along with diverticulitis  . CORONARY ANGIOPLASTY WITH STENT PLACEMENT  11/2014   [facility]: 90% RCA - PCI with Promus DES.  [name] implanted cardiac monitor     . LOOP RECORDER INSERTION  07/24/2013   Medtronic Linq - to evaluate SSS for symptomatic bradycardia  . Right and Left Heart Catheterization  11/2014   RHC: RAP 4, RVP 32/4, PA peak 29/7/16. LVEDP 18.  CORS: 30% LM, 50% LAD, 90% RCA --> PCI  (Promus DES)  . TEE WITHOUT CARDIOVERSION  09/2014   Normal LV function. Severe MR with multiple jets. Vena contractile 0.8.  PFO with L-R shunt; moderate grade 3-4 aortic atheroma  . TONSILLECTOMY    . TRANSTHORACIC ECHOCARDIOGRAM  07/2017   EF 60 to 65%.  Moderate-severe MR.  Moderate TR with elevated RVSP  . TRANSTHORACIC ECHOCARDIOGRAM  08/2014   Normal LV function with biatrial enlargement. Moderate-severe mitral and tricuspid insufficiency.     MEDICATIONS/ALLERGIES   Current Meds  Medication Sig  . acetaminophen (TYLENOL) 325 MG tablet Take 325 mg by mouth at bedtime. daily   . ALPRAZolam (XANAX) 0.25 MG tablet Take 1 tablet (0.25 mg total) by mouth at bedtime as needed for anxiety.  [name] amiodarone (PACERONE) 100 MG tablet Take 200mg  by mouth daily until Nov 02, 2019 then decrease to 100mg  by mouth daily (Patient taking differently: Take 100 mg by mouth daily. )  . aspirin EC 81 MG tablet Take 81 mg by mouth daily.  . Carboxymethylcellul-Glycerin (REFRESH OPTIVE OP) Place 1 drop into both eyes daily as needed (dry eyes).  . [EXPIRED] cefdinir (OMNICEF) 300 MG capsule  Take 1 capsule (300 mg total) by mouth 2 (two) times daily for 3 days.  . ferrous sulfate 325 (65 FE) MG tablet Take 325 mg by mouth 2 (two) times daily with a meal.   . fluticasone (FLONASE) 50 MCG/ACT nasal spray Place 2 sprays into both nostrils daily as needed for allergies or rhinitis.   [name]. levothyroxine (SYNTHROID) 75 MCG tablet TAKE 1 TABLET BY MOUTH ONCE A DAY BEFORE BREAKFAST (Patient taking differently: Take 75 mcg by mouth daily before breakfast. TAKE 1 TABLET BY MOUTH ONCE A DAY BEFORE BREAKFAST)  . meclizine (ANTIVERT) 25 MG tablet Take 0.5-1 tablets (12.5-25 mg total) by mouth 3 (three) times daily as needed for up to 30 doses for dizziness.  [name]. omeprazole (PRILOSEC) 20 MG capsule TAKE 1 CAPSULE (20 MG TOTAL) BY MOUTH DAILY.  [name]. polyethylene glycol (MIRALAX / GLYCOLAX) packet Take 8.5 g by mouth daily as needed  for moderate constipation. Mix 1/2 scoop (8.5 g) in 8 oz orange juice and drink daily  . [DISCONTINUED] amiodarone (PACERONE) 200 MG tablet Take 1 tablet (200 mg total) by mouth daily.    Allergies  Allergen Reactions  . Carvedilol Other (See Comments)    Bradycardia   . Diltiazem Other (See Comments)    Bradycardia  . Lopressor [Metoprolol] Other (See Comments)    Bradycardia     SOCIAL HISTORY/FAMILY HISTORY   Social History   Tobacco Use  . Smoking status: Former Smoker    Packs/day: 0.50    Years: 5.00    Pack years: 2.50    Types: Cigarettes    Start date: 1966    Quit date: 08/02/1970    Years since quitting: 49.2  . Smokeless tobacco: Never Used  Substance Use Topics  . Alcohol use: Yes    Comment: "wine occasionally"  . Drug use: No   Social History   Social History Narrative   Married for 59 years    Has a son, daughter died of breast cancer   One grandson    [place] Lives there from [place]- [place]) to be closer to their son & only remaining family.    Family History family history includes Cancer in her brother and daughter; Diabetes in her brother; Stomach cancer in her father.   OBJCTIVE -PE, EKG, labs   Wt Readings from Last 3 Encounters:  10/18/19 115 lb 3.2 oz (52.3 kg)  10/10/19 115 lb 3.2 oz (52.3 kg)  10/07/19 110 lb 1.6 oz (49.9 kg)    Physical Exam: BP (!) 163/64   Pulse 69   Ht 5' (1.524 m)   Wt 115 lb 3.2 oz (52.3 kg)   LMP  (LMP Unknown)   SpO2 99%   BMI 22.50 kg/m  Physical Exam  Constitutional: No distress.  Thin, frail elderly woman.  Somewhat confused.  Well-groomed.  HENT:  Head: Normocephalic and atraumatic.  Neck: No JVD present.  Cardiovascular: Normal rate, regular rhythm, S1 normal and S2 normal.  No extrasystoles are present. PMI is not displaced. Exam reveals distant heart sounds and decreased pulses (Difficult to palpate but palpable pedal pulses.). Exam reveals no gallop.  Murmur  heard. High-pitched harsh crescendo-decrescendo midsystolic murmur is present with a grade of 1/6 at the upper right sternal [name].  Blowing holosystolic murmur of grade 2/6 is also present at the lower left sternal [name] and apex. Pulmonary/Chest: Effort normal and breath sounds normal. She exhibits no tenderness.  Mild interstitial breath sounds but no obvious  wheezes rales or rhonchi.  Abdominal: Soft. Bowel sounds are normal. She exhibits no distension. There is no abdominal tenderness. There is no rebound.  Musculoskeletal:        General: No edema. Normal range of motion.     Cervical back: Normal range of motion and neck supple.  Neurological: She is alert.  Psychiatric: She has a normal mood and affect.  Most questions answered by her husband.  She seems to be still somewhat confused.  Anxious and frightened  Vitals reviewed.   Adult ECG Report  Rate: 69 ;  Rhythm: normal sinus rhythm and Normal axis, intervals and durations.;   Narrative Interpretation: Relatively normal  Recent Labs: None Lab Results  Component Value Date   CHOL 169 07/19/2017   HDL 54.80 07/19/2017   LDLCALC 83 07/19/2017   TRIG 152.0 (H) 07/19/2017   CHOLHDL 3 07/19/2017   Lab Results  Component Value Date   CREATININE 0.71 10/19/2019   BUN 8 10/19/2019   NA 135 10/19/2019   K 3.8 10/19/2019   CL 106 10/19/2019   CO2 22 10/19/2019    ASSESSMENT/PLAN    Problem List Items Addressed This Visit    Coronary artery disease involving native heart without angina pectoris - Primary (Chronic)    Thankfully, no active angina.  I do not think that any of this is related to ischemia.  Continue on aspirin alone.  Has done well without other medications.  No beta-blocker ARB/ACE inhibitor or statin..      Relevant Medications   amiodarone (PACERONE) 100 MG tablet   Other Relevant Orders   EKG 12-Lead (Completed)   Mitral regurgitation and aortic stenosis (Chronic)    No plans for further invasive  management.  I suspect that her valvular disease did contribute some to her syncopal spell could be related to A. fib RVR.  For now try to maintain sinus rhythm with amiodarone.      Relevant Medications   amiodarone (PACERONE) 100 MG tablet   Paroxysmal atrial fibrillation ([place]): CHA2DS2Vasc = 6; Not currently on AC. (Chronic)    This is perhaps the first spell of rapid A. fib that could have been related to her syncope.  On amiodarone although I doubt that she would be able tolerate 200 mg daily amiodarone going forward.  Low threshold to reduce to 100 mg daily or even every other day.  No plans for anticoagulation based on baseline fall risk.      Relevant Medications   amiodarone (PACERONE) 100 MG tablet   Palliative care by specialist    As per discussion in the past, no plans for invasive evaluation or monitoring.  She is DNR/DNI.         Plan will be to reduce her amiodarone to 100 mg daily after 2 more weeks.  Low threshold to reduce to every other day.  COVID-19 Education: The signs and symptoms of COVID-19 were discussed with the patient and how to seek care for testing (follow up with PCP or arrange E-visit).   The importance of social distancing was discussed today.  I spent a total of 36minutes with the patient and chart review. >  50% of the time was spent in direct patient consultation.  Additional time spent with chart review (studies, outside notes, etc): 8 Total Time: 32 min   Current medicines are reviewed at length with the patient today.  (+/- concerns) none   Patient Instructions / Medication Changes & Studies & Tests Ordered  Patient Instructions  Medication Instructions:  Take current dose of amiodarone until Feb 14 After Feb 14, decrease amiodarone to 100mg  daily Continue other current medications   *If you need a refill on your cardiac medications before your next appointment, please call your pharmacy*   Follow-Up: At [facility], you  and your health needs are our priority.  As part of our continuing mission to provide you with exceptional heart care, we have created designated Provider Care Teams.  These Care Teams include your primary Cardiologist (physician) and Advanced Practice Providers (APPs -  Physician Assistants and Nurse Practitioners) who all work together to provide you with the care you need, when you need it.  Your next appointment:   3 month(s)  The format for your next appointment:   Virtual Visit   Provider:   [facility], MD  Other Instructions  Dr. Bryan Lemma recommends boost, ensure, or carnation instant breakfast     Studies Ordered:   Orders Placed This Encounter  Procedures  . EKG 12-Lead     Herbie Baltimore, M.D., M.S. Interventional Cardiologist   Pager # [phone] Phone # [phone] [address] [place], [place]   Thank you for choosing Heartcare at [facility]!!

## 2019-10-16 ENCOUNTER — Ambulatory Visit: Payer: Medicare HMO

## 2019-10-18 ENCOUNTER — Emergency Department (HOSPITAL_COMMUNITY): Payer: Medicare HMO

## 2019-10-18 ENCOUNTER — Other Ambulatory Visit: Payer: Self-pay

## 2019-10-18 ENCOUNTER — Encounter (HOSPITAL_COMMUNITY): Payer: Self-pay | Admitting: *Deleted

## 2019-10-18 ENCOUNTER — Inpatient Hospital Stay (HOSPITAL_COMMUNITY)
Admission: EM | Admit: 2019-10-18 | Discharge: 2019-10-20 | DRG: 392 | Disposition: A | Discharge status: Home / Self Care | Payer: Medicare HMO | Attending: Internal Medicine | Admitting: Internal Medicine

## 2019-10-18 DIAGNOSIS — Z20822 Contact with and (suspected) exposure to covid-19: Secondary | ICD-10-CM | POA: Diagnosis present

## 2019-10-18 DIAGNOSIS — Z7189 Other specified counseling: Secondary | ICD-10-CM

## 2019-10-18 DIAGNOSIS — D508 Other iron deficiency anemias: Secondary | ICD-10-CM | POA: Diagnosis not present

## 2019-10-18 DIAGNOSIS — E785 Hyperlipidemia, unspecified: Secondary | ICD-10-CM | POA: Diagnosis present

## 2019-10-18 DIAGNOSIS — Z9861 Coronary angioplasty status: Secondary | ICD-10-CM | POA: Diagnosis not present

## 2019-10-18 DIAGNOSIS — D649 Anemia, unspecified: Secondary | ICD-10-CM | POA: Diagnosis present

## 2019-10-18 DIAGNOSIS — Z955 Presence of coronary angioplasty implant and graft: Secondary | ICD-10-CM

## 2019-10-18 DIAGNOSIS — I083 Combined rheumatic disorders of mitral, aortic and tricuspid valves: Secondary | ICD-10-CM | POA: Diagnosis present

## 2019-10-18 DIAGNOSIS — E860 Dehydration: Secondary | ICD-10-CM | POA: Diagnosis present

## 2019-10-18 DIAGNOSIS — Z7982 Long term (current) use of aspirin: Secondary | ICD-10-CM

## 2019-10-18 DIAGNOSIS — Z79890 Hormone replacement therapy (postmenopausal): Secondary | ICD-10-CM | POA: Diagnosis not present

## 2019-10-18 DIAGNOSIS — I480 Paroxysmal atrial fibrillation: Secondary | ICD-10-CM | POA: Diagnosis present

## 2019-10-18 DIAGNOSIS — E039 Hypothyroidism, unspecified: Secondary | ICD-10-CM | POA: Diagnosis present

## 2019-10-18 DIAGNOSIS — I5032 Chronic diastolic (congestive) heart failure: Secondary | ICD-10-CM | POA: Diagnosis present

## 2019-10-18 DIAGNOSIS — I495 Sick sinus syndrome: Secondary | ICD-10-CM | POA: Diagnosis present

## 2019-10-18 DIAGNOSIS — I110 Hypertensive heart disease with heart failure: Secondary | ICD-10-CM | POA: Diagnosis present

## 2019-10-18 DIAGNOSIS — I10 Essential (primary) hypertension: Secondary | ICD-10-CM

## 2019-10-18 DIAGNOSIS — K529 Noninfective gastroenteritis and colitis, unspecified: Principal | ICD-10-CM | POA: Diagnosis present

## 2019-10-18 DIAGNOSIS — I080 Rheumatic disorders of both mitral and aortic valves: Secondary | ICD-10-CM | POA: Diagnosis present

## 2019-10-18 DIAGNOSIS — Z79899 Other long term (current) drug therapy: Secondary | ICD-10-CM | POA: Diagnosis not present

## 2019-10-18 DIAGNOSIS — Z87891 Personal history of nicotine dependence: Secondary | ICD-10-CM

## 2019-10-18 DIAGNOSIS — I700 Atherosclerosis of aorta: Secondary | ICD-10-CM | POA: Diagnosis present

## 2019-10-18 DIAGNOSIS — Z8542 Personal history of malignant neoplasm of other parts of uterus: Secondary | ICD-10-CM | POA: Diagnosis not present

## 2019-10-18 DIAGNOSIS — I38 Endocarditis, valve unspecified: Secondary | ICD-10-CM | POA: Diagnosis not present

## 2019-10-18 DIAGNOSIS — I2510 Atherosclerotic heart disease of native coronary artery without angina pectoris: Secondary | ICD-10-CM | POA: Diagnosis present

## 2019-10-18 DIAGNOSIS — Z833 Family history of diabetes mellitus: Secondary | ICD-10-CM | POA: Diagnosis not present

## 2019-10-18 DIAGNOSIS — Z800 Family history of malignant neoplasm of digestive organs: Secondary | ICD-10-CM

## 2019-10-18 DIAGNOSIS — Z66 Do not resuscitate: Secondary | ICD-10-CM | POA: Diagnosis present

## 2019-10-18 DIAGNOSIS — F419 Anxiety disorder, unspecified: Secondary | ICD-10-CM | POA: Diagnosis present

## 2019-10-18 DIAGNOSIS — Z515 Encounter for palliative care: Secondary | ICD-10-CM

## 2019-10-18 DIAGNOSIS — Z888 Allergy status to other drugs, medicaments and biological substances status: Secondary | ICD-10-CM | POA: Diagnosis not present

## 2019-10-18 DIAGNOSIS — R55 Syncope and collapse: Secondary | ICD-10-CM | POA: Diagnosis present

## 2019-10-18 LAB — URINALYSIS, ROUTINE W REFLEX MICROSCOPIC
Bacteria, UA: NONE SEEN
Bilirubin Urine: NEGATIVE
Glucose, UA: NEGATIVE mg/dL
Ketones, ur: NEGATIVE mg/dL
Leukocytes,Ua: NEGATIVE
Nitrite: NEGATIVE
Protein, ur: NEGATIVE mg/dL
Specific Gravity, Urine: 1.025 (ref 1.005–1.030)
pH: 7 (ref 5.0–8.0)

## 2019-10-18 LAB — COMPREHENSIVE METABOLIC PANEL
ALT: 16 U/L (ref 0–44)
AST: 16 U/L (ref 15–41)
Albumin: 3.2 g/dL — ABNORMAL LOW (ref 3.5–5.0)
Alkaline Phosphatase: 68 U/L (ref 38–126)
Anion gap: 10 (ref 5–15)
BUN: 11 mg/dL (ref 8–23)
CO2: 24 mmol/L (ref 22–32)
Calcium: 8.7 mg/dL — ABNORMAL LOW (ref 8.9–10.3)
Chloride: 98 mmol/L (ref 98–111)
Creatinine, Ser: 0.76 mg/dL (ref 0.44–1.00)
GFR calc Af Amer: >60 mL/min (ref >60)
GFR calc non Af Amer: >60 mL/min (ref >60)
Glucose, Bld: 149 mg/dL — ABNORMAL HIGH (ref 70–99)
Potassium: 4.4 mmol/L (ref 3.5–5.1)
Sodium: 132 mmol/L — ABNORMAL LOW (ref 135–145)
Total Bilirubin: 0.5 mg/dL (ref 0.3–1.2)
Total Protein: 6.2 g/dL — ABNORMAL LOW (ref 6.5–8.1)

## 2019-10-18 LAB — CBC
HCT: 36.3 % (ref 36.0–46.0)
Hemoglobin: 11.4 g/dL — ABNORMAL LOW (ref 12.0–15.0)
MCH: 29.8 pg (ref 26.0–34.0)
MCHC: 31.4 g/dL (ref 30.0–36.0)
MCV: 95 fL (ref 80.0–100.0)
Platelets: 441 10*3/uL — ABNORMAL HIGH (ref 150–400)
RBC: 3.82 MIL/uL — ABNORMAL LOW (ref 3.87–5.11)
RDW: 14.4 % (ref 11.5–15.5)
WBC: 11.9 10*3/uL — ABNORMAL HIGH (ref 4.0–10.5)
nRBC: 0 % (ref 0.0–0.2)

## 2019-10-18 LAB — TROPONIN I (HIGH SENSITIVITY)
Troponin I (High Sensitivity): 7 ng/L (ref <18)
Troponin I (High Sensitivity): 7 ng/L (ref <18)

## 2019-10-18 LAB — MRSA PCR SCREENING: MRSA by PCR: NEGATIVE

## 2019-10-18 LAB — LIPASE, BLOOD: Lipase: 18 U/L (ref 11–51)

## 2019-10-18 LAB — MAGNESIUM: Magnesium: 2 mg/dL (ref 1.7–2.4)

## 2019-10-18 LAB — SARS CORONAVIRUS 2 (TAT 6-24 HRS): SARS Coronavirus 2: NEGATIVE

## 2019-10-18 MED ORDER — SODIUM CHLORIDE 0.9 % IV SOLN: 250.0000 mL | INTRAVENOUS | Status: DC | PRN

## 2019-10-18 MED ORDER — MECLIZINE HCL 12.5 MG PO TABS: 12.5000 mg | ORAL_TABLET | Freq: Three times a day (TID) | ORAL | Status: DC | PRN

## 2019-10-18 MED ORDER — IOHEXOL 300 MG/ML  SOLN
100.0000 mL | Freq: Once | INTRAMUSCULAR | Status: AC | PRN
  Administered 2019-10-18: 100 mL via INTRAVENOUS

## 2019-10-18 MED ORDER — ENOXAPARIN SODIUM 40 MG/0.4ML ~~LOC~~ SOLN
40.0000 mg | Freq: Every day | SUBCUTANEOUS | Status: DC
  Administered 2019-10-18 – 2019-10-20 (×3): 40 mg via SUBCUTANEOUS
  Filled 2019-10-18 (×3): qty 0.4

## 2019-10-18 MED ORDER — CIPROFLOXACIN IN D5W 400 MG/200ML IV SOLN
400.0000 mg | Freq: Once | INTRAVENOUS | Status: AC
  Administered 2019-10-18: 400 mg via INTRAVENOUS
  Filled 2019-10-18: qty 200

## 2019-10-18 MED ORDER — SODIUM CHLORIDE 0.9% FLUSH: 3.0000 mL | Freq: Once | INTRAVENOUS | Status: DC

## 2019-10-18 MED ORDER — SODIUM CHLORIDE 0.9 % IV BOLUS (SEPSIS)
500.0000 mL | Freq: Once | INTRAVENOUS | Status: AC
  Administered 2019-10-18: 500 mL via INTRAVENOUS

## 2019-10-18 MED ORDER — ONDANSETRON HCL 4 MG PO TABS: 4.0000 mg | ORAL_TABLET | Freq: Four times a day (QID) | ORAL | Status: DC | PRN

## 2019-10-18 MED ORDER — ACETAMINOPHEN 325 MG PO TABS
325.0000 mg | ORAL_TABLET | Freq: Every day | ORAL | Status: DC
  Administered 2019-10-18 – 2019-10-19 (×2): 325 mg via ORAL
  Filled 2019-10-18 (×2): qty 1

## 2019-10-18 MED ORDER — METRONIDAZOLE IN NACL 5-0.79 MG/ML-% IV SOLN
500.0000 mg | Freq: Once | INTRAVENOUS | Status: AC
  Administered 2019-10-18: 500 mg via INTRAVENOUS
  Filled 2019-10-18: qty 100

## 2019-10-18 MED ORDER — ONDANSETRON HCL 4 MG/2ML IJ SOLN: 4.0000 mg | Freq: Four times a day (QID) | INTRAMUSCULAR | Status: DC | PRN

## 2019-10-18 MED ORDER — LEVOTHYROXINE SODIUM 75 MCG PO TABS
75.0000 ug | ORAL_TABLET | Freq: Every day | ORAL | Status: DC
  Administered 2019-10-18 – 2019-10-20 (×3): 75 ug via ORAL
  Filled 2019-10-18 (×3): qty 1

## 2019-10-18 MED ORDER — FLUTICASONE PROPIONATE 50 MCG/ACT NA SUSP: 2.0000 | Freq: Every day | NASAL | Status: DC | PRN

## 2019-10-18 MED ORDER — ASPIRIN EC 81 MG PO TBEC
81.0000 mg | DELAYED_RELEASE_TABLET | Freq: Every day | ORAL | Status: DC
  Administered 2019-10-18 – 2019-10-20 (×3): 81 mg via ORAL
  Filled 2019-10-18 (×3): qty 1

## 2019-10-18 MED ORDER — METRONIDAZOLE IN NACL 5-0.79 MG/ML-% IV SOLN
500.0000 mg | Freq: Three times a day (TID) | INTRAVENOUS | Status: DC
  Administered 2019-10-18 – 2019-10-20 (×7): 500 mg via INTRAVENOUS
  Filled 2019-10-18 (×7): qty 100

## 2019-10-18 MED ORDER — FERROUS SULFATE 325 (65 FE) MG PO TABS
325.0000 mg | ORAL_TABLET | Freq: Two times a day (BID) | ORAL | Status: DC
  Administered 2019-10-18 – 2019-10-20 (×5): 325 mg via ORAL
  Filled 2019-10-18 (×5): qty 1

## 2019-10-18 MED ORDER — SODIUM CHLORIDE 0.9% FLUSH: 3.0000 mL | INTRAVENOUS | Status: DC | PRN

## 2019-10-18 MED ORDER — KETOROLAC TROMETHAMINE 15 MG/ML IJ SOLN: 15.0000 mg | Freq: Four times a day (QID) | INTRAMUSCULAR | Status: DC | PRN

## 2019-10-18 MED ORDER — SODIUM CHLORIDE 0.9% FLUSH
3.0000 mL | Freq: Two times a day (BID) | INTRAVENOUS | Status: DC
  Administered 2019-10-18 – 2019-10-19 (×4): 3 mL via INTRAVENOUS

## 2019-10-18 MED ORDER — ALPRAZOLAM 0.25 MG PO TABS: 0.2500 mg | ORAL_TABLET | Freq: Every evening | ORAL | Status: DC | PRN

## 2019-10-18 MED ORDER — SODIUM CHLORIDE 0.9 % IV SOLN: INTRAVENOUS | Status: AC

## 2019-10-18 MED ORDER — AMIODARONE HCL 100 MG PO TABS
100.0000 mg | ORAL_TABLET | Freq: Every day | ORAL | Status: DC
  Administered 2019-10-18 – 2019-10-20 (×3): 100 mg via ORAL
  Filled 2019-10-18 (×3): qty 1

## 2019-10-18 NOTE — ED Triage Notes (Signed)
Pt arrives via [facility] from home with c/o Abdominal pain and dizziness since about 2 hours ago. She was on abx for UTI, started having diarrhea, caused abdominal pain, and when she has diarrhea she has dizziness with standing up. 162/82, 99% RA, HR 64 (afib, hx of the same. 201 CBG. RR 20, 98.1. IV established in the left forearm. Pt also has had recent change to her BP meds. A/O at baseline.

## 2019-10-18 NOTE — ED Notes (Signed)
Pt arrives to ED from [place], no c/o pain at this time. Pt placed in position of comfort with bed locked and lowered, call bell in reach.

## 2019-10-18 NOTE — Progress Notes (Addendum)
Pt has one time of anxiety episode this evening but settle down quickly. Pt was asking she does not know where she is and what's going on, Her husband was there to support and asking some sleeping medicine for tonight for her, will see how it goes later, there is bed time Xanax, will pass it to the next shift. Lonia Farber, RN

## 2019-10-18 NOTE — ED Notes (Signed)
Pt incont of stool. Cleaned up and purewick placed on patient for urine collection.

## 2019-10-18 NOTE — ED Notes (Signed)
Pt transferred to CT.

## 2019-10-18 NOTE — ED Provider Notes (Signed)
TIME SEEN: 3:03 AM  CHIEF COMPLAINT: Syncope, diarrhea, abdominal pain  HPI: Patient is a 86 year old female with history of CAD, CHF, hypertension, paroxysmal atrial fibrillation and sick sinus syndrome not on anticoagulation who presents to the emergency department after she had 2 syncopal events at home.  Husband provides most of the history.  States that she went to the bathroom and had a syncopal event where she passed out on the toilet.  She was out for a few seconds and then came back to and was acting normally.  States he was try to transfer her from toilet to a chair and she had another syncopal event.  Similar presentation.  No seizure-like activity.  Husband reports that he did not fall to the floor either time.  Patient was complaining of abdominal pain today and has had diarrhea for several days.  No vomiting.  No bright red blood per rectum.  Always has dark stools per husband as she is on iron tablets.  Patient just finished cefdinir for UTI.  She started on 10/07/2019 and took it for 5 days.  Patient was just admitted to the hospital on 10/03/2019 for similar complaints.  CT head on 10/03/2019 showed no acute abnormality.  CT of the chest, abdomen pelvis showed no dissection or aneurysm of the aorta but did show mild wall thickening of the descending colon without pericolonic edema that could be secondary to colitis.  There was diverticulosis without diverticulitis.  In the ED she was found to be in A. fib with RVR.  Diltiazem initially ordered in the ED but patient suddenly became hypotensive and heart rate dropped from 140s into the 80s before this medication could be started.  Was given IV fluids and phenylephrine push dose which improved her blood pressure.  Patient was seen by cardiology and started on amiodarone.  Per husband at that time, patient was a DNR and did not want pacemaker.  Also not anticoagulated.  Recommended holding Lasix for 1 week on discharge as she did appear  dehydrated.  Patient denies any chest pain or shortness of breath.  No known fevers or cough.    ROS: See HPI Constitutional: no fever  Eyes: no drainage  ENT: no runny nose   Cardiovascular:  no chest pain  Resp: no SOB  GI: no vomiting; + diarrhea GU: no dysuria Integumentary: no rash  Allergy: no hives  Musculoskeletal: no leg swelling  Neurological: no slurred speech ROS otherwise negative  PAST MEDICAL HISTORY/PAST SURGICAL HISTORY:  Past Medical History:  Diagnosis Date  . Anxiety   . Bradycardia   . CAD S/P percutaneous coronary angioplasty 11/2014   DES PCI to RCA. Cath was done for pre-operative evaluation for mitral repair; LM 30%, LAD 50%, RCA 90% (Promus DES)  . 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 Normal EF by echo.  . Diverticulitis   . Essential hypertension   . H/O: upper GI bleed 12/03/2014   While on ASA/Plavix & Warfarin --> Hct dropped to 18%; small AVM noted but no overt pathology.  [name] History of uterine cancer 2001   Status post hysterectomy  . Hyperlipidemia with target LDL less than 70    Coronary disease and thoracic aortic atheroma  . Hypothyroidism    On Levothyroxine  . Paroxysmal atrial fibrillation (HCC) 2014   Associated with sick sinus syndrome. -- Intolerant of beta blockers and diltiazem. Rate control with digoxin. Not on anticoagulation despite CHA2DS2Vasc  6  2/2 prior severe GI bleed on triple therapy  . PFO (patent foramen ovale)    Noted on TEE - L-R shunt (elsewhere reported it as a ASD)  . Severe mitral regurgitation by prior echocardiogram 09/2014   Seen on TEE to have severe MR with multiple jets. Vena contracted 0.8; Consideration had been ? Mitral E-Clip - put on hold after GI Bleed.  . Sick sinus syndrome ([place])    Status post Loop Recorder: [employer] Linq -- most recent interrogation 05/19/2016: 2 new pauses. One greater than 3 seconds at 0 320 the morning.  Second was 4.4 seconds at 9:56 PM; also noted to have an episode of A. fib. --> Recommended further follow-up upon arrival to [facility].  . Thoracic aortic atherosclerosis ([place]) 09/2014   Moderate grade 3-4 atheroma noted on TEE  . Urinary tract infection   . Vasovagal syncope    Exacerbated by bradycardia, sick sinus syndrome, and mitral regurgitation.  . Vertigo, central, unspecified laterality    Chronic dizziness.    MEDICATIONS:  Prior to Admission medications   Medication Sig Start Date End Date Taking? Authorizing Provider  acetaminophen (TYLENOL) 325 MG tablet Take 650 mg by mouth at bedtime. daily     [provider]  ALPRAZolam (XANAX) 0.25 MG tablet Take 1 tablet (0.25 mg total) by mouth at bedtime as needed for anxiety. 08/01/19   Dorothyann Peng, NP  amiodarone (PACERONE) 100 MG tablet Take 200mg  by mouth daily until Nov 02, 2019 then decrease to 100mg  by mouth daily 10/10/19   Leonie Man, MD  aspirin EC 81 MG tablet Take 81 mg by mouth daily.    [provider]  Carboxymethylcellul-Glycerin (REFRESH OPTIVE OP) Place 1 drop into both eyes daily as needed (dry eyes).    [provider]  ferrous sulfate 325 (65 FE) MG tablet Take 325 mg by mouth 2 (two) times daily with a meal.     [provider]  fluticasone (FLONASE) 50 MCG/ACT nasal spray Place 2 sprays into both nostrils daily as needed for allergies or rhinitis.     [provider]  levothyroxine (SYNTHROID) 75 MCG tablet TAKE 1 TABLET BY MOUTH ONCE A DAY BEFORE BREAKFAST 10/09/19   Nafziger, Tommi Rumps, NP  meclizine (ANTIVERT) 25 MG tablet Take 0.5-1 tablets (12.5-25 mg total) by mouth 3 (three) times daily as needed for up to 30 doses for dizziness. 10/06/19   Antonieta Pert, MD  omeprazole (PRILOSEC) 20 MG capsule TAKE 1 CAPSULE (20 MG TOTAL) BY MOUTH DAILY. 10/09/19   Nafziger, Tommi Rumps, NP  polyethylene glycol (MIRALAX / GLYCOLAX) packet Take 8.5 g by mouth daily as needed for  moderate constipation. Mix 1/2 scoop (8.5 g) in 8 oz orange juice and drink daily    [provider]    ALLERGIES:  Allergies  Allergen Reactions  . Carvedilol Other (See Comments)    Bradycardia   . Diltiazem Other (See Comments)    Bradycardia  . Lopressor [Metoprolol] Other (See Comments)    Bradycardia    SOCIAL HISTORY:  Social History   Tobacco Use  . Smoking status: Former Smoker    Packs/day: 0.50    Years: 5.00    Pack years: 2.50    Types: Cigarettes    Start date: 1966    Quit date: 08/02/1970    Years since quitting: 49.2  . Smokeless tobacco: Never Used  Substance Use Topics  . Alcohol use: Yes    Comment: "wine occasionally"  FAMILY HISTORY: Family History  Problem Relation Age of Onset  . Stomach cancer Father   . Cancer Brother   . Diabetes Brother   . Cancer Daughter     EXAM: BP (!) 147/47 (BP Location: Right Arm)   Pulse 62   Temp 98.5 F (36.9 C) (Oral)   Resp 16   LMP  (LMP Unknown)   SpO2 99%  CONSTITUTIONAL: Alert and oriented to person and place but not year.  Elderly, thin.  In no distress. HEAD: Normocephalic, atraumatic EYES: Conjunctivae clear, pupils appear equal, EOM appear intact ENT: normal nose; moist mucous membranes NECK: Supple, normal ROM CARD: RRR; S1 and S2 appreciated; no murmurs, no clicks, no rubs, no gallops RESP: Normal chest excursion without splinting or tachypnea; breath sounds clear and equal bilaterally; no wheezes, no rhonchi, no rales, no hypoxia or respiratory distress, speaking full sentences ABD/GI: Normal bowel sounds; non-distended; soft, diffusely tender to palpation, no rebound, no guarding, no peritoneal signs, no hepatosplenomegaly BACK:  The back appears normal EXT: Normal ROM in all joints; no deformity noted, no edema; no cyanosis SKIN: Normal color for age and race; warm; no rash on exposed skin NEURO: Moves all extremities equally, normal sensation diffusely, cranial nerves II  through XII intact, normal speech PSYCH: The patient's mood and manner are appropriate.   MEDICAL DECISION MAKING: Patient here with diarrhea, abdominal pain and 2 syncopal events.  Just admitted to the hospital 10/03/2019 -10/07/2019.  Found to have tachybradycardia syndrome.  Patient has known sick sinus syndrome but has refused pacemaker and anticoagulation.  Just started on amiodarone per cardiology.  Also finished antibiotics for a UTI.  Differential today includes dehydration, UTI, colitis, diverticulitis, bowel obstruction.  Less likely ACS.  Symptoms could be due to arrhythmia, sick sinus syndrome.  EKG shows no ischemic change or interval abnormality.  Will check electrolytes, hemoglobin, cardiac labs, urine, CT of abdomen pelvis.  ED PROGRESS: Nurse reports patient had no urine in her bladder on urinary catheterization.  Appears dry on exam.  Will give gentle IV hydration.  Patient's hemoglobin is 11.4.  Normal electrolytes and renal function.  Normal glucose.  Troponin is normal at 7.  No cardiac events noted on cardiac monitoring.  CT shows colitis which explains her abdominal pain, diarrhea.  Given recent hospitalization and antibiotic use, will obtain stool studies.  Will give Cipro, Flagyl.  Have recommended admission and husband agrees.  5:01 AM Cardiac monitoring reveals 60s NSR (Rate & rhythm), as reviewed and interpreted by me. Cardiac monitoring was ordered due to syncope and to monitor patient for dysrhythmia.  5:11 AM Discussed patient's case with hospitalist, Dr. Nelda Bucks.  I have recommended admission and patient (and family if present) agree with this plan. Admitting physician will place admission orders.   I reviewed all nursing notes, vitals, pertinent previous records and interpreted all EKGs, lab and urine results, imaging (as available).    EKG Interpretation  Date/Time:  Saturday October 18 2019 00:44:32 EST Ventricular Rate:  66 PR Interval:    QRS Duration: 98 QT  Interval:  446 QTC Calculation: 468 R Axis:   -43 Text Interpretation: Sinus rhythm Atrial premature complex Prolonged PR interval Abnormal R-wave progression, late transition Left ventricular hypertrophy No significant change since last tracing Confirmed by Rochele Raring [id number]) on 10/18/2019 3:04:09 AM         [name] was evaluated in Emergency Department on 10/18/2019 for the symptoms described in the history of present illness. She was evaluated  in the context of the global COVID-19 pandemic, which necessitated consideration that the patient might be at risk for infection with the SARS-CoV-2 virus that causes COVID-19. Institutional protocols and algorithms that pertain to the evaluation of patients at risk for COVID-19 are in a state of rapid change based on information released by regulatory bodies including the CDC and federal and state organizations. These policies and algorithms were followed during the patient's care in the ED.  Patient was seen wearing N95, face shield, gloves.    Gerarda Conklin, Layla Maw, DO 10/18/19 [phone]

## 2019-10-18 NOTE — ED Notes (Signed)
Attempt made to in and out cath the pt for urine collection no urine available, provider notified, order for IV NS bolus gotten and started. Husband at the bedside, pt resting on bed comfortable at this time.

## 2019-10-18 NOTE — Progress Notes (Signed)
Pt does not have a BM so far to send for C-dif test, Covid negative. Pt is alert times 2-3 this time, husband is in bed side, tolerating diet, IV fluid continue, denies pain and discomfort, will continue to monitor the patient  Lonia Farber, RN

## 2019-10-18 NOTE — Plan of Care (Signed)

## 2019-10-18 NOTE — Progress Notes (Signed)
Patient ID: Deborah [name], female   DOB: 23-Apr-1931, 86 y.o.   MRN: [id number] Patient was admitted early this morning for syncope along with abdominal pain and diarrhea.  Patient seen and examined at bedside and plan of care discussed with her.  She feels slightly better.  No diarrhea since admission as per nursing staff.  Await Covid and C. difficile testing.  Repeat a.m. labs.

## 2019-10-18 NOTE — H&P (Signed)
History and Physical    Deborah ApaRose Pinard ZOX:[id number] DOB: 04-01-1931 DOA: 10/18/2019  PCP: Shirline FreesNafziger, [name], NP    Patient coming from: Home    Chief Complaint: Syncopal episode x2, abdominal pain diarrhea  HPI: Deborah [name] is a 86 y.o. female with medical history significant of coronary artery disease status post PCI, chronic congestive diastolic heart failure, hypertension, hyperlipidemia, hypothyroidism, paracystic atrial fibrillation, sick sinus syndrome, vasovagal syncope, vertigo, came with a chief complaint of abdominal pain, one episode of severe diarrhea, and 2 episodes of syncopal episodes at home. Patient states she had diarrhea for several days with some abdominal pain. She just finished the treatment for UTI / Cefdinir for 5 days Husband provides most of the history.  States that she went to the bathroom and had a syncopal event where she passed out on the toilet.  She was out for a few seconds and then came back to and was acting normally.  States he was try to transfer her from toilet to a chair and she had another syncopal event.  Similar presentation.  No seizure-like activity.  Husband reports that he did not fall to the floor either time  ED Course:  In the emergency room she was found in mild distress with some abdominal pain Blood work White count 11.9 hemoglobin 11.4 platelets 441 sodium 132 potassium 4.4 glucose 149 BUN 11 creatinine 0.7  CT scan abdomen pelvis 1. There is mild wall thickening of the cecum and ascending colon raising the possibility of colitis. Possibility of underlying colonic lesion is not entirely excluded. Consider further evaluation with colonoscopy after resolution of the acute symptomatology if not recently performed.  Patient started empirically on Cipro and Flagyl  Review of Systems: As per HPI otherwise 10 point review of systems negative.  With the exception of abdominal pain, diarrhea, syncopal episodes  Past Medical History:    Diagnosis Date  . Anxiety   . Bradycardia   . CAD S/P percutaneous coronary angioplasty 11/2014   DES PCI to RCA. Cath was done for pre-operative evaluation for mitral repair; LM 30%, LAD 50%, RCA 90% (Promus DES)  . 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 Normal EF by echo.  . Diverticulitis   . Essential hypertension   . H/O: upper GI bleed 12/03/2014   While on ASA/Plavix & Warfarin --> Hct dropped to 18%; small AVM noted but no overt pathology.  [name]. History of uterine cancer 2001   Status post hysterectomy  . Hyperlipidemia with target LDL less than 70    Coronary disease and thoracic aortic atheroma  . Hypothyroidism    On Levothyroxine  . Paroxysmal atrial fibrillation (HCC) 2014   Associated with sick sinus syndrome. -- Intolerant of beta blockers and diltiazem. Rate control with digoxin. Not on anticoagulation despite CHA2DS2Vasc 6  2/2 prior severe GI bleed on triple therapy  . PFO (patent foramen ovale)    Noted on TEE - L-R shunt (elsewhere reported it as a ASD)  . Severe mitral regurgitation by prior echocardiogram 09/2014   Seen on TEE to have severe MR with multiple jets. Vena contracted 0.8; Consideration had been ? Mitral E-Clip - put on hold after GI Bleed.  . Sick sinus syndrome (HCC)    Status post Loop Recorder: [employer]onic Linq -- most recent interrogation 05/19/2016: 2 new pauses. One greater than 3 seconds at 0 320 the morning. Second was 4.4 seconds at 9:56 PM; also noted to  have an episode of A. fib. --> Recommended further follow-up upon arrival to [facility].  . Thoracic aortic atherosclerosis (HCC) 09/2014   Moderate grade 3-4 atheroma noted on TEE  . Urinary tract infection   . Vasovagal syncope    Exacerbated by bradycardia, sick sinus syndrome, and mitral regurgitation.  . Vertigo, central, unspecified laterality    Chronic dizziness.    Past Surgical History:  Procedure Laterality  Date  . ABDOMINAL HYSTERECTOMY  2001  . Cataract Surgery  Bilateral   . COLONOSCOPY W/ BIOPSIES  2006   2 polyps noted along with diverticulitis  . CORONARY ANGIOPLASTY WITH STENT PLACEMENT  11/2014   [facility]: 90% RCA - PCI with Promus DES.  [name] implanted cardiac monitor     . LOOP RECORDER INSERTION  07/24/2013   Medtronic Linq - to evaluate SSS for symptomatic bradycardia  . Right and Left Heart Catheterization  11/2014   RHC: RAP 4, RVP 32/4, PA peak 29/7/16. LVEDP 18.  CORS: 30% LM, 50% LAD, 90% RCA --> PCI (Promus DES)  . TEE WITHOUT CARDIOVERSION  09/2014   Normal LV function. Severe MR with multiple jets. Vena contractile 0.8.  PFO with L-R shunt; moderate grade 3-4 aortic atheroma  . TONSILLECTOMY    . TRANSTHORACIC ECHOCARDIOGRAM  07/2017   EF 60 to 65%.  Moderate-severe MR.  Moderate TR with elevated RVSP  . TRANSTHORACIC ECHOCARDIOGRAM  08/2014   Normal LV function with biatrial enlargement. Moderate-severe mitral and tricuspid insufficiency.     reports that she quit smoking about 49 years ago. Her smoking use included cigarettes. She started smoking about 55 years ago. She has a 2.50 pack-year smoking history. She has never used smokeless tobacco. She reports current alcohol use. She reports that she does not use drugs.  Allergies  Allergen Reactions  . Carvedilol Other (See Comments)    Bradycardia   . Diltiazem Other (See Comments)    Bradycardia  . Lopressor [Metoprolol] Other (See Comments)    Bradycardia    Family History  Problem Relation Age of Onset  . Stomach cancer Father   . Cancer Brother   . Diabetes Brother   . Cancer Daughter      Prior to Admission medications   Medication Sig Start Date End Date Taking? Authorizing Provider  acetaminophen (TYLENOL) 325 MG tablet Take 325 mg by mouth at bedtime. daily    Yes [provider]  ALPRAZolam (XANAX) 0.25 MG tablet Take 1 tablet (0.25 mg total) by  mouth at bedtime as needed for anxiety. 08/01/19  Yes Nafziger, Kandee Keen, NP  amiodarone (PACERONE) 100 MG tablet Take 200mg  by mouth daily until Nov 02, 2019 then decrease to 100mg  by mouth daily Patient taking differently: Take 100 mg by mouth daily.  10/10/19  Yes , MD  aspirin EC 81 MG tablet Take 81 mg by mouth daily.   Yes [provider]  Carboxymethylcellul-Glycerin (REFRESH OPTIVE OP) Place 1 drop into both eyes daily as needed (dry eyes).   Yes [provider]  ferrous sulfate 325 (65 FE) MG tablet Take 325 mg by mouth 2 (two) times daily with a meal.    Yes [provider]  fluticasone (FLONASE) 50 MCG/ACT nasal spray Place 2 sprays into both nostrils daily as needed for allergies or rhinitis.    Yes [provider]  levothyroxine (SYNTHROID) 75 MCG tablet TAKE 1 TABLET BY MOUTH ONCE A DAY BEFORE BREAKFAST Patient taking differently:  Take 75 mcg by mouth daily before breakfast. TAKE 1 TABLET BY MOUTH ONCE A DAY BEFORE BREAKFAST 10/09/19  Yes Nafziger, Kandee Keen, NP  meclizine (ANTIVERT) 25 MG tablet Take 0.5-1 tablets (12.5-25 mg total) by mouth 3 (three) times daily as needed for up to 30 doses for dizziness. 10/06/19  Yes Kc, Dayna Barker, MD  omeprazole (PRILOSEC) 20 MG capsule TAKE 1 CAPSULE (20 MG TOTAL) BY MOUTH DAILY. 10/09/19  Yes Nafziger, Kandee Keen, NP  polyethylene glycol (MIRALAX / GLYCOLAX) packet Take 8.5 g by mouth daily as needed for moderate constipation. Mix 1/2 scoop (8.5 g) in 8 oz orange juice and drink daily   Yes [provider]    Physical Exam: Vitals:   10/18/19 0040 10/18/19 0245 10/18/19 0300 10/18/19 0403  BP: (!) 147/47 (!) 146/56    Pulse: 62 (!) 55 62   Resp: 16     Temp: 98.5 F (36.9 C)     TempSrc: Oral     SpO2: 99% 96% 99%   Weight:    52.3 kg  Height:    5' (1.524 m)    Constitutional: NAD, calm, comfortable Vitals:   10/18/19 0040 10/18/19 0245 10/18/19 0300 10/18/19 0403  BP: (!) 147/47 (!) 146/56     Pulse: 62 (!) 55 62   Resp: 16     Temp: 98.5 F (36.9 C)     TempSrc: Oral     SpO2: 99% 96% 99%   Weight:    52.3 kg  Height:    5' (1.524 m)   Eyes: PERRL, lids and conjunctivae normal ENMT: Mucous membranes are moist. Posterior pharynx clear of any exudate or lesions.Normal dentition.  Neck: normal, supple, no masses, no thyromegaly Respiratory: clear to auscultation bilaterally, no wheezing, no crackles. Normal respiratory effort. No accessory muscle use.  Cardiovascular: Regular rate and rhythm, no murmurs / rubs / gallops. No extremity edema. 2+ pedal pulses. No carotid bruits.  Abdomen: Mild diffuse tenderness , no masses palpated. No hepatosplenomegaly. Bowel sounds positive.  Musculoskeletal: no clubbing / cyanosis. No joint deformity upper and lower extremities. Good ROM, no contractures. Normal muscle tone.  Skin: no rashes, lesions, ulcers. No induration Neurologic: CN 2-12 grossly intact. Sensation intact, DTR normal. Strength 5/5 in all 4.  Psychiatric: Normal judgment and insight. Alert and oriented x 3. Normal mood.    Labs on Admission: I have personally reviewed following labs and imaging studies  CBC: Recent Labs  Lab 10/18/19 0054  WBC 11.9*  HGB 11.4*  HCT 36.3  MCV 95.0  PLT 441*   Basic Metabolic Panel: Recent Labs  Lab 10/18/19 0054 10/18/19 0319  NA 132*  --   K 4.4  --   CL 98  --   CO2 24  --   GLUCOSE 149*  --   BUN 11  --   CREATININE 0.76  --   CALCIUM 8.7*  --   MG  --  2.0   GFR: Estimated Creatinine Clearance: 36.3 mL/min (by C-G formula based on SCr of 0.76 mg/dL). Liver Function Tests: Recent Labs  Lab 10/18/19 0054  AST 16  ALT 16  ALKPHOS 68  BILITOT 0.5  PROT 6.2*  ALBUMIN 3.2*   Recent Labs  Lab 10/18/19 0054  LIPASE 18   No results for input(s): AMMONIA in the last 168 hours. Coagulation Profile: No results for input(s): INR, PROTIME in the last 168 hours. Cardiac Enzymes: No results for input(s):  CKTOTAL, CKMB, CKMBINDEX, TROPONINI in the last 168  hours. BNP (last 3 results) No results for input(s): PROBNP in the last 8760 hours. HbA1C: No results for input(s): HGBA1C in the last 72 hours. CBG: No results for input(s): GLUCAP in the last 168 hours. Lipid Profile: No results for input(s): CHOL, HDL, LDLCALC, TRIG, CHOLHDL, LDLDIRECT in the last 72 hours. Thyroid Function Tests: No results for input(s): TSH, T4TOTAL, FREET4, T3FREE, THYROIDAB in the last 72 hours. Anemia Panel: No results for input(s): VITAMINB12, FOLATE, FERRITIN, TIBC, IRON, RETICCTPCT in the last 72 hours. Urine analysis:    Component Value Date/Time   COLORURINE YELLOW 10/06/2019 0204   APPEARANCEUR CLEAR 10/06/2019 0204   LABSPEC 1.023 10/06/2019 0204   PHURINE 5.0 10/06/2019 0204   GLUCOSEU NEGATIVE 10/06/2019 0204   HGBUR NEGATIVE 10/06/2019 0204   BILIRUBINUR NEGATIVE 10/06/2019 0204   KETONESUR NEGATIVE 10/06/2019 0204   PROTEINUR NEGATIVE 10/06/2019 0204   NITRITE NEGATIVE 10/06/2019 0204   LEUKOCYTESUR MODERATE (A) 10/06/2019 0204    Radiological Exams on Admission: CT ABDOMEN PELVIS W CONTRAST  Result Date: 10/18/2019 CLINICAL DATA:  Patient with abdominal pain and dizziness. EXAM: CT ABDOMEN AND PELVIS WITH CONTRAST TECHNIQUE: Multidetector CT imaging of the abdomen and pelvis was performed using the standard protocol following bolus administration of intravenous contrast. CONTRAST:  OMNIPAQUE IOHEXOL 300 MG/ML  SOLN COMPARISON:  CTA CAP 10/03/2019 FINDINGS: Lower chest: Similar-appearing 2 cm tubular structure within the right lower lobe (image 7; series 4). Minimal dependent atelectasis. No pleural effusion. Hepatobiliary: Liver is normal in size and contour. No focal hepatic lesions identified. Gallbladder is unremarkable. Pancreas: There is a 7 mm cystic lesion within the uncinate process (image 26; series 3). No surrounding inflammatory change. Spleen: Unremarkable Adrenals/Urinary Tract:  Normal adrenal glands. Kidneys are lobular in contour. Redemonstrated small hypoechoic lesions within the kidneys bilaterally, too small to characterize. 4 mm right renal stone versus vascular calcification (image 23; series 3). Urinary bladder is unremarkable. Stomach/Bowel: Sigmoid colonic diverticulosis. No CT evidence for acute diverticulitis. There is wall thickening of the cecum and ascending colon. No evidence for small bowel obstruction. No free fluid or free intraperitoneal air. Vascular/Lymphatic: Normal caliber abdominal aorta. Peripheral calcified atherosclerotic plaque. No retroperitoneal lymphadenopathy. Reproductive: Status post hysterectomy. Other: None. Musculoskeletal: Lower thoracic and lumbar spine degenerative changes. Similar-appearing mild compression fractures of the L1 and L2 vertebral bodies. Moderate L4 vertebral body compression fracture. IMPRESSION: 1. There is mild wall thickening of the cecum and ascending colon raising the possibility of colitis. Possibility of underlying colonic lesion is not entirely excluded. Consider further evaluation with colonoscopy after resolution of the acute symptomatology if not recently performed. 2. Lumbar spine wedge compression deformities. 3. Small (7 mm) cystic lesion within the uncinate process of the pancreas. Recommend follow-up pre and post contrast-enhanced MRI in 2 years to assess stability. 4. Tubular nodular masslike structure within the right lower lobe which is nonspecific in etiology. Recommend follow-up chest CT in 6 months. Electronically Signed   By: Annia Belt M.D.   On: 10/18/2019 04:19    EKG: Independently reviewed.  Atrial fibrillation no acute ST-T changes  Assessment plan  Syncope Probably vasovagal plus minus dehydration 2 syncopal events at home trying to go to the bathroom Complaining of abdominal pain, passed out on the toilet, she was trying to transfer her from toilet to the chair and she had another syncopal  event. Patient was recently admitted for same syncope and UTI And just finished the cefdinir for 5 days Electrocardiogram no acute ST-T changes A.  Fib Troponin negative Plan IV fluids, no more work-up  Abdominal pain and diarrhea Patient is with severe episode of diarrhea at home Patient in no severe distress some abdominal tenderness CT abdomen and pelvis  right colon colitis GI panel and C. difficile was ordered by emergency room physician The stool was not collected yet Patient started in ED on Cipro and Flagyl I will just continue with Flagyl till I get the C. difficile results back Keep on isolation for now Plan IV fluids, Toradol IV as needed for pain, diet clear liquids until C. difficile results.  Atrial fibrillation with sick sinus syndrome Resume aspirin amiodarone  Hypothyroidism Resume Synthroid  Chronic anemia Ferrous sulfate  History of vertigo Antivert as needed    Assessment/Plan Active Problems:   Essential hypertension   Hypothyroidism   Coronary artery disease involving native heart without angina pectoris   Mitral regurgitation and aortic stenosis   CAD S/P DES PCI to RCA   Anemia   Paroxysmal atrial fibrillation ([place]): CHA2DS2Vasc = 6; Not currently on AC.   Chronic diastolic heart failure due to valvular disease (HCC)   Hyperlipidemia with target LDL less than 70   Syncope   Colitis      DVT prophylaxis: Lovenox Code Status: DNR Family Communication: Family member in the room Disposition Plan: Home Consults called: No Admission status: Full admission   Sabatino Williard G Kielyn Kardell MD [facility]  If 7PM-7AM, please contact night-coverage [email or web address]   10/18/2019, 5:50 AM

## 2019-10-19 ENCOUNTER — Encounter: Payer: Self-pay | Admitting: Cardiology

## 2019-10-19 DIAGNOSIS — Z7189 Other specified counseling: Secondary | ICD-10-CM

## 2019-10-19 DIAGNOSIS — R55 Syncope and collapse: Secondary | ICD-10-CM

## 2019-10-19 DIAGNOSIS — I480 Paroxysmal atrial fibrillation: Secondary | ICD-10-CM

## 2019-10-19 DIAGNOSIS — Z515 Encounter for palliative care: Secondary | ICD-10-CM

## 2019-10-19 LAB — COMPREHENSIVE METABOLIC PANEL
ALT: 12 U/L (ref 0–44)
AST: 12 U/L — ABNORMAL LOW (ref 15–41)
Albumin: 2.4 g/dL — ABNORMAL LOW (ref 3.5–5.0)
Alkaline Phosphatase: 50 U/L (ref 38–126)
Anion gap: 7 (ref 5–15)
BUN: 8 mg/dL (ref 8–23)
CO2: 22 mmol/L (ref 22–32)
Calcium: 8 mg/dL — ABNORMAL LOW (ref 8.9–10.3)
Chloride: 106 mmol/L (ref 98–111)
Creatinine, Ser: 0.71 mg/dL (ref 0.44–1.00)
GFR calc Af Amer: >60 mL/min (ref >60)
GFR calc non Af Amer: >60 mL/min (ref >60)
Glucose, Bld: 83 mg/dL (ref 70–99)
Potassium: 3.8 mmol/L (ref 3.5–5.1)
Sodium: 135 mmol/L (ref 135–145)
Total Bilirubin: 0.8 mg/dL (ref 0.3–1.2)
Total Protein: 4.7 g/dL — ABNORMAL LOW (ref 6.5–8.1)

## 2019-10-19 LAB — CBC
HCT: 30.3 % — ABNORMAL LOW (ref 36.0–46.0)
Hemoglobin: 9.4 g/dL — ABNORMAL LOW (ref 12.0–15.0)
MCH: 29.7 pg (ref 26.0–34.0)
MCHC: 31 g/dL (ref 30.0–36.0)
MCV: 95.9 fL (ref 80.0–100.0)
Platelets: 383 10*3/uL (ref 150–400)
RBC: 3.16 MIL/uL — ABNORMAL LOW (ref 3.87–5.11)
RDW: 14.7 % (ref 11.5–15.5)
WBC: 5.4 10*3/uL (ref 4.0–10.5)
nRBC: 0 % (ref 0.0–0.2)

## 2019-10-19 LAB — URINE CULTURE: Culture: NO GROWTH

## 2019-10-19 LAB — MAGNESIUM: Magnesium: 1.9 mg/dL (ref 1.7–2.4)

## 2019-10-19 MED ORDER — SODIUM CHLORIDE 0.9 % IV SOLN
2.0000 g | INTRAVENOUS | Status: DC
  Administered 2019-10-19: 2 g via INTRAVENOUS
  Filled 2019-10-19: qty 20
  Filled 2019-10-19: qty 2

## 2019-10-19 NOTE — Assessment & Plan Note (Signed)
This is perhaps the first spell of rapid A. fib that could have been related to her syncope.  On amiodarone although I doubt that she would be able tolerate 200 mg daily amiodarone going forward.  Low threshold to reduce to 100 mg daily or even every other day.  No plans for anticoagulation based on baseline fall risk.

## 2019-10-19 NOTE — Progress Notes (Signed)
Physical Therapy Treatment Patient Details Name: Deborah [name] MRN: [id number] DOB: 06-Dec-1930 Today's Date: 10/19/2019    History of Present Illness Deborah [name] is a 86 y.o. female with medical history significant of coronary artery disease status post PCI, chronic congestive diastolic heart failure, hypertension, hyperlipidemia, hypothyroidism, paracystic atrial fibrillation, sick sinus syndrome, vasovagal syncope, vertigo, came with a chief complaint of abdominal pain, one episode of severe diarrhea, and 2 episodes of syncopal episodes at home. CT abdomen and pelvis showing right colon colitis.     PT Comments    Pt appears to be close to functional baseline based on husband report. Pt is confused, oriented to self only, but following all commands. Pt husband reports history of decreased orientation and short term memory prior to admission. Pt ambulating 100 feet at a min guard assist level; HR peak 108 bpm. Pt denies dizziness/lightheadedness. Presents with balance impairments, decreased activity tolerance and generalized weakness. Will continue to follow acutely to progress mobility.    Follow Up Recommendations  Supervision/Assistance - 24 hour;No PT follow up (pt husband politely declining HH due to Covid concerns)     Equipment Recommendations  None recommended by PT    Recommendations for Other Services       Precautions / Restrictions Precautions Precautions: Fall Restrictions Weight Bearing Restrictions: No    Mobility  Bed Mobility               General bed mobility comments: OOB in chair upon entry  Transfers Overall transfer level: Needs assistance Equipment used: None Transfers: Sit to/from Stand Sit to Stand: Min guard            Ambulation/Gait Ambulation/Gait assistance: Min guard Gait Distance (Feet): 100 Feet Assistive device: 1 person hand held assist Gait Pattern/deviations: Step-through pattern;Trunk flexed;Decreased stride  length;Shuffle;Narrow base of support Gait velocity: decreased Gait velocity interpretation: <1.8 ft/sec, indicate of risk for recurrent falls General Gait Details: Min guard for stability, tendency to reach out for external support. Slow, shuffling gait pattern   Stairs             Wheelchair Mobility    Modified Rankin (Stroke Patients Only)       Balance Overall balance assessment: Needs assistance Sitting-balance support: Feet supported;No upper extremity supported Sitting balance-Leahy Scale: Good     Standing balance support: No upper extremity supported;During functional activity Standing balance-Leahy Scale: Fair                              Cognition Arousal/Alertness: Awake/alert Behavior During Therapy: WFL for tasks assessed/performed Overall Cognitive Status: History of cognitive impairments - at baseline                                 General Comments: Pt A&O to self, not oriented to time, place or situation. Stating it was April, 1936 and she was in [place]. Unable to correctly answer when given options. Pt husband states this is baseline; she has been experiencing worsening memory.      Exercises      General Comments        Pertinent Vitals/Pain Pain Assessment: No/denies pain    Home Living Family/patient expects to be discharged to:: Private residence Living Arrangements: Spouse/significant other Available Help at Discharge: Family;Available 24 hours/day Type of Home: House Home Access: Stairs to enter Entrance Stairs-Rails: Can reach both Home Layout:  One level Home Equipment: Cane - single point;Grab bars - toilet;Grab bars - tub/shower      Prior Function Level of Independence: Needs assistance  Gait / Transfers Assistance Needed: furniture walks for household distances; uses cane otherwise ADL's / Homemaking Assistance Needed: requires assist for all IADL's     PT Goals (current goals can now be  found in the care plan section) Acute Rehab PT Goals Patient Stated Goal: go home PT Goal Formulation: With patient/family Time For Goal Achievement: 11/02/19 Potential to Achieve Goals: Fair    Frequency    Min 3X/week      PT Plan      Co-evaluation              AM-PAC PT "6 Clicks" Mobility   Outcome Measure  Help needed turning from your back to your side while in a flat bed without using bedrails?: A Little Help needed moving from lying on your back to sitting on the side of a flat bed without using bedrails?: A Little Help needed moving to and from a bed to a chair (including a wheelchair)?: A Little Help needed standing up from a chair using your arms (e.g., wheelchair or bedside chair)?: A Little Help needed to walk in hospital room?: A Little Help needed climbing 3-5 steps with a railing? : A Lot 6 Click Score: 17    End of Session Equipment Utilized During Treatment: Gait belt Activity Tolerance: Patient tolerated treatment well Patient left: in chair;with call bell/phone within reach;with family/visitor present Nurse Communication: Mobility status PT Visit Diagnosis: Muscle weakness (generalized) (M62.81);Other abnormalities of gait and mobility (R26.89)     Time: 9937-1696 PT Time Calculation (min) (ACUTE ONLY): 14 min  Charges:                          Lillia Pauls, PT, DPT Acute Rehabilitation Services Pager [phone] Office [phone]    Norval Morton 10/19/2019, 1:32 PM

## 2019-10-19 NOTE — Assessment & Plan Note (Signed)
Thankfully, no active angina.  I do not think that any of this is related to ischemia.  Continue on aspirin alone.  Has done well without other medications.  No beta-blocker ARB/ACE inhibitor or statin.Deborah [name]

## 2019-10-19 NOTE — Progress Notes (Signed)
Enteric precaution discontinued with a verbal order from MD, pt does not have any BM since admission.  Lonia Farber, RN

## 2019-10-19 NOTE — Progress Notes (Signed)
Patient ID: Deborah [name], female   DOB: [birth year], 86 y.o.   MRN: [id number]  PROGRESS NOTE    Deborah [name]  JQB:[id number] DOB: 25-Sep-1930 DOA: 10/18/2019 PCP: Shirline Frees, NP   Brief Narrative:  86 year old female with history of coronary artery disease status post PCI, chronic diastolic congestive heart failure, hypertension, hyperlipidemia, hypothyroidism, paroxysmal A. fib, sick sinus syndrome, vasovagal syncope, vertigo presented with 2 syncopal episodes along with abdominal pain and diarrhea.  She had recently finished treatment for UTI with cefdinir for 5 days as an outpatient.  In the ED, CT of the abdomen and pelvis showed mild thickening of the cecum and ascending colon raising the possibility of colitis; possibility of underlying colonic lesion is not entirely excluded and consider further evaluation with colonoscopy after resolution of acute symptomatology.  She was started on Flagyl.  Stool testing was ordered.  Assessment & Plan:   Syncope, most likely vasovagal syncope along with dehydration -Presented with 2 syncopal episodes at home trying to go to the bathroom -EKG on presentation did not show any acute ST-T wave changes -Treated with IV fluids. -No more syncopal episodes since admission. -PT eval. -Patient had cardiology/EP evaluation during recent hospitalization few weeks ago and husband had wished for no further invasive measures such as pacemaker placement placement.  Probable colitis -Presented with abdominal pain and diarrhea. -CT of the abdomen and pelvis showed mild thickening of the cecum and ascending colon raising the possibility of colitis; possibility of underlying colonic lesion is not entirely excluded and consider further evaluation with colonoscopy after resolution of acute symptomatology.  -Has been empirically placed on Flagyl. -No diarrhea since admission.  Will DC enteric precautions.  We will add Rocephin as well. -Advance diet as tolerated.   Continue pain management.  Abdominal pain is improving.  Paroxysmal A. Fib Sick sinus syndrome -Continue aspirin and amiodarone.  Not on anticoagulation as per family wishes.  Outpatient follow-up with cardiology  Hypothyroidism -Continue Synthroid  Generalized conditioning -PT/OT eval -Patient has had recurrent hospitalizations for the same reason.  Will request palliative care evaluation for goals of care discussion.    DVT prophylaxis: Lovenox Code Status: DNR Family Communication: Spoke to husband/[name] on phone on 10/19/2019 Disposition Plan: Home in 1-2 days if clinically improved and tolerates PT  Consultants: None  Procedures: None  Antimicrobials: Flagyl   Subjective: Patient seen and examined at bedside.  She keeps asking for her husband.  Poor historian.  No diarrhea reported since admission by nursing staff.  Tolerating diet.  Objective: Vitals:   10/18/19 2016 10/18/19 2257 10/19/19 0200 10/19/19 0725  BP:  102/68 110/79 99/62  Pulse:  83 72 73  Resp:  19 18 20   Temp: 97.6 F (36.4 C) 98.3 F (36.8 C) (!) 97.4 F (36.3 C) 97.7 F (36.5 C)  TempSrc: Oral Oral Oral Oral  SpO2: 95%  94% 100%  Weight:      Height:        Intake/Output Summary (Last 24 hours) at 10/19/2019 1001 Last data filed at 10/19/2019 0800 Gross per 24 hour  Intake 1019.86 ml  Output 1625 ml  Net -605.14 ml   Filed Weights   10/18/19 0403  Weight: 52.3 kg    Examination:  General exam: Elderly female lying in bed.  Poor historian. Respiratory system: Bilateral decreased breath sounds at bases with some scattered crackles Cardiovascular system: S1 & S2 heard, Rate controlled Gastrointestinal system: Abdomen is nondistended, soft and mild diffuse tenderness, mostly in the lower quadrant.  Normal bowel sounds heard. Extremities: No cyanosis, clubbing; trace lower extremity edema Central nervous system: Awake, slightly confused.  Poor historian.  No focal neurological deficits.  Moving extremities Skin: No rashes, lesions or ulcers Psychiatry: Could not be assessed because of mental status.    Data Reviewed: I have personally reviewed following labs and imaging studies  CBC: Recent Labs  Lab 10/18/19 0054 10/19/19 0243  WBC 11.9* 5.4  HGB 11.4* 9.4*  HCT 36.3 30.3*  MCV 95.0 95.9  PLT 441* 562   Basic Metabolic Panel: Recent Labs  Lab 10/18/19 0054 10/18/19 0319 10/19/19 0243  NA 132*  --  135  K 4.4  --  3.8  CL 98  --  106  CO2 24  --  22  GLUCOSE 149*  --  83  BUN 11  --  8  CREATININE 0.76  --  0.71  CALCIUM 8.7*  --  8.0*  MG  --  2.0 1.9   GFR: Estimated Creatinine Clearance: 36.3 mL/min (by C-G formula based on SCr of 0.71 mg/dL). Liver Function Tests: Recent Labs  Lab 10/18/19 0054 10/19/19 0243  AST 16 12*  ALT 16 12  ALKPHOS 68 50  BILITOT 0.5 0.8  PROT 6.2* 4.7*  ALBUMIN 3.2* 2.4*   Recent Labs  Lab 10/18/19 0054  LIPASE 18   No results for input(s): AMMONIA in the last 168 hours. Coagulation Profile: No results for input(s): INR, PROTIME in the last 168 hours. Cardiac Enzymes: No results for input(s): CKTOTAL, CKMB, CKMBINDEX, TROPONINI in the last 168 hours. BNP (last 3 results) No results for input(s): PROBNP in the last 8760 hours. HbA1C: No results for input(s): HGBA1C in the last 72 hours. CBG: No results for input(s): GLUCAP in the last 168 hours. Lipid Profile: No results for input(s): CHOL, HDL, LDLCALC, TRIG, CHOLHDL, LDLDIRECT in the last 72 hours. Thyroid Function Tests: No results for input(s): TSH, T4TOTAL, FREET4, T3FREE, THYROIDAB in the last 72 hours. Anemia Panel: No results for input(s): VITAMINB12, FOLATE, FERRITIN, TIBC, IRON, RETICCTPCT in the last 72 hours. Sepsis Labs: No results for input(s): PROCALCITON, LATICACIDVEN in the last 168 hours.  Recent Results (from the past 240 hour(s))  SARS CORONAVIRUS 2 (TAT 6-24 HRS) Nasopharyngeal Nasopharyngeal Swab     Status: None    Collection Time: 10/18/19  5:52 AM   Specimen: Nasopharyngeal Swab  Result Value Ref Range Status   SARS Coronavirus 2 NEGATIVE NEGATIVE Final    Comment: (NOTE) SARS-CoV-2 target nucleic acids are NOT DETECTED. The SARS-CoV-2 RNA is generally detectable in upper and lower respiratory specimens during the acute phase of infection. Negative results do not preclude SARS-CoV-2 infection, do not rule out co-infections with other pathogens, and should not be used as the sole basis for treatment or other patient management decisions. Negative results must be combined with clinical observations, patient history, and epidemiological information. The expected result is Negative. Fact Sheet for Patients: SugarRoll.be Fact Sheet for Healthcare Providers: https://www.woods-mathews.com/ This test is not yet approved or cleared by the [place] FDA and  has been authorized for detection and/or diagnosis of SARS-CoV-2 by FDA under an Emergency Use Authorization (EUA). This EUA will remain  in effect (meaning this test can be used) for the duration of the COVID-19 declaration under Section 56 4(b)(1) of the Act, 21 U.S.C. section 360bbb-3(b)(1), unless the authorization is terminated or revoked sooner. Performed at [facility], [place] [address], [place], [place] [address]   Urine culture  Status: None   Collection Time: 10/18/19  5:53 AM   Specimen: Urine, Clean Catch  Result Value Ref Range Status   Specimen Description URINE, CLEAN CATCH  Final   Special Requests NONE  Final   Culture   Final    NO GROWTH Performed at [facility], [address] [place], [place] [address]    Report Status 10/19/2019 FINAL  Final  MRSA PCR Screening     Status: None   Collection Time: 10/18/19  7:02 AM   Specimen: Nasopharyngeal  Result Value Ref Range Status   MRSA by PCR NEGATIVE NEGATIVE Final    Comment:        The GeneXpert MRSA Assay  (FDA approved for NASAL specimens only), is one component of a comprehensive MRSA colonization surveillance program. It is not intended to diagnose MRSA infection nor to guide or monitor treatment for MRSA infections. Performed at [facility], [address], [place], [place] [address]          Radiology Studies: CT ABDOMEN PELVIS W CONTRAST  Result Date: 10/18/2019 CLINICAL DATA:  Patient with abdominal pain and dizziness. EXAM: CT ABDOMEN AND PELVIS WITH CONTRAST TECHNIQUE: Multidetector CT imaging of the abdomen and pelvis was performed using the standard protocol following bolus administration of intravenous contrast. CONTRAST:  OMNIPAQUE IOHEXOL 300 MG/ML  SOLN COMPARISON:  CTA CAP 10/03/2019 FINDINGS: Lower chest: Similar-appearing 2 cm tubular structure within the right lower lobe (image 7; series 4). Minimal dependent atelectasis. No pleural effusion. Hepatobiliary: Liver is normal in size and contour. No focal hepatic lesions identified. Gallbladder is unremarkable. Pancreas: There is a 7 mm cystic lesion within the uncinate process (image 26; series 3). No surrounding inflammatory change. Spleen: Unremarkable Adrenals/Urinary Tract: Normal adrenal glands. Kidneys are lobular in contour. Redemonstrated small hypoechoic lesions within the kidneys bilaterally, too small to characterize. 4 mm right renal stone versus vascular calcification (image 23; series 3). Urinary bladder is unremarkable. Stomach/Bowel: Sigmoid colonic diverticulosis. No CT evidence for acute diverticulitis. There is wall thickening of the cecum and ascending colon. No evidence for small bowel obstruction. No free fluid or free intraperitoneal air. Vascular/Lymphatic: Normal caliber abdominal aorta. Peripheral calcified atherosclerotic plaque. No retroperitoneal lymphadenopathy. Reproductive: Status post hysterectomy. Other: None. Musculoskeletal: Lower thoracic and lumbar spine degenerative changes.  Similar-appearing mild compression fractures of the L1 and L2 vertebral bodies. Moderate L4 vertebral body compression fracture. IMPRESSION: 1. There is mild wall thickening of the cecum and ascending colon raising the possibility of colitis. Possibility of underlying colonic lesion is not entirely excluded. Consider further evaluation with colonoscopy after resolution of the acute symptomatology if not recently performed. 2. Lumbar spine wedge compression deformities. 3. Small (7 mm) cystic lesion within the uncinate process of the pancreas. Recommend follow-up pre and post contrast-enhanced MRI in 2 years to assess stability. 4. Tubular nodular masslike structure within the right lower lobe which is nonspecific in etiology. Recommend follow-up chest CT in 6 months. Electronically Signed   By: Annia Belt M.D.   On: 10/18/2019 04:19        Scheduled Meds: . acetaminophen  325 mg Oral QHS  . amiodarone  100 mg Oral Daily  . aspirin EC  81 mg Oral Daily  . enoxaparin (LOVENOX) injection  40 mg Subcutaneous Daily  . ferrous sulfate  325 mg Oral BID WC  . levothyroxine  75 mcg Oral QAC breakfast  . sodium chloride flush  3 mL Intravenous Once  .  sodium chloride flush  3 mL Intravenous Q12H   Continuous Infusions: . sodium chloride    . metronidazole 500 mg (10/19/19 0514)          Glade Lloyd, MD [facility] 10/19/2019, 10:01 AM

## 2019-10-19 NOTE — Progress Notes (Signed)
Pt is alert times 2 this time, sat in a recliner all day, vitals stable, denies chest pain and SOB, husband is in bed side,   will continue to monitor the patient  Lonia Farber, RN

## 2019-10-19 NOTE — Assessment & Plan Note (Signed)
As per discussion in the past, no plans for invasive evaluation or monitoring.  She is DNR/DNI.

## 2019-10-19 NOTE — Assessment & Plan Note (Signed)
No plans for further invasive management.  I suspect that her valvular disease did contribute some to her syncopal spell could be related to A. fib RVR.  For now try to maintain sinus rhythm with amiodarone.

## 2019-10-19 NOTE — Consult Note (Signed)
Consultation Note Date: 10/19/2019   Patient Name: Deborah [name]  DOB: 1930-11-26  MRN: [id number]  Age / Sex: 86 y.o., female  PCP: Dorothyann Peng, NP Referring Physician: Aline August, MD  Reason for Consultation: Establishing goals of care  HPI/Patient Profile:  Per intake H&P --> Deborah [name] is a 86 y.o. female with medical history significant of coronary artery disease status post PCI, chronic congestive diastolic heart failure, hypertension, hyperlipidemia, hypothyroidism, paracystic atrial fibrillation, sick sinus syndrome, vasovagal syncope, vertigo, came with a chief complaint of abdominal pain, one episode of severe diarrhea, and 2 episodes of syncopal episodes at home. Patient states she had diarrhea for several days with some abdominal pain. She just finished the treatment for UTI / Cefdinir for 5 days. Husband provides most of the history. States that she went to the bathroom and had a syncopal event where she passed out on the toilet. She was out for a few seconds and then came back to and was acting normally. States he was try to transfer her from toilet to a chair and she had another syncopal event. Similar presentation. No seizure-like activity. Husband reports that he did not fall to the floor either time.  Clinical Assessment and Goals of Care: I have reviewed medical records including EPIC notes, labs and imaging, received report from bedside RN, assessed the patient who is in good spirits this afternoon. She stated that she is feeling improved.   I met with Deborah [name] and her husband, Deborah [name] to further discuss diagnosis prognosis, GOC, EOL wishes, disposition and options.   I introduced Palliative Medicine as specialized medical care for people living with serious illness. It focuses on providing relief from the symptoms and stress of a serious illness. The goal is to improve  quality of life for both the patient and the family.  I asked miss Witczak to tell me about herself. She shared that is originally from [place]. She and her husband moved to [place] four years ago after the death of the daughter. They have been married for 70 two years. Together they share a son and daughter (now deceased). 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   We discussed code status. I asked if either of them had ever filled in a MOST form before which they had not. They stated that they have a living will but had never seen a MOST form. We reviewed in detail each component of the most form. They agreed that [facility] would not want CPR, intubation, long term IVF, or a feeding tube. She would alright with hospital admissions, antibiotics, trial period of antibiotics, and possibly Bipap depending on the circumstances.   We talked about what brings Chloris joy. She shared that being close to her son and now 78 year old  grandchild bring her happiness. She recounts stories from many years prior of when and how she met her husband. He goals include spending as much time as she can with him. She shared that her daughter was ill for sometime and this caused great stress to she and her husband. She states that her daughter was a wonderful person. Her husband correlates Roses cognitive impairment around the time her daughter died.   Discussed with patient the importance of continued conversation with family and their  medical providers regarding overall plan of care and treatment options, ensuring decisions are within the context of the patients values and GOCs.  All questions answered, informed Pairlee that we are here as an extra layer of support for the medical  teams. Encouraged her and her husband to reach out to [place] with any questions that arise.   Decision Maker: Spouse --> Deborah [name] [phone]  SUMMARY OF RECOMMENDATIONS   MOST completed  Code Status/Advance Care Planning:  DNR  Symptom Management:   Per primary  Palliative Prophylaxis:   Aspiration, Bowel Regimen, Delirium Protocol, Eye Care, Frequent Pain Assessment, Oral Care, Palliative Wound Care and Turn Reposition  Additional Recommendations (Limitations, Scope, Preferences):  Minimize Medications  Psycho-social/Spiritual:   Desire for further Chaplaincy support:no  Additional Recommendations: Caregiving  Support/Resources  Prognosis:   Unable to determine  Discharge Planning: Home with Home Health     Primary Diagnoses: Present on Admission: . Essential hypertension . Hypothyroidism . Coronary artery disease involving native heart without angina pectoris . Mitral regurgitation and aortic stenosis . Paroxysmal atrial fibrillation ([place]): CHA2DS2Vasc = 6; Not currently on AC. [name] Chronic diastolic heart failure due to valvular disease ([place]) . Anemia . Hyperlipidemia with target LDL less than 70 . Syncope  I have reviewed the medical record, interviewed the patient and family, and examined the patient. The following aspects are pertinent.  Past Medical History:  Diagnosis Date  . Anxiety   . Bradycardia   . CAD S/P percutaneous coronary angioplasty 11/2014   DES PCI to RCA. Cath was done for pre-operative evaluation for mitral repair; LM 30%, LAD 50%, RCA 90% (Promus DES)  . Chronic diastolic heart failure due to valvular disease ([place]) 2015   Related to severe mitral regurgitation. Normal EF by echo.  . Diverticulitis   . Essential hypertension   . H/O: upper GI bleed 12/03/2014   While on ASA/Plavix & Warfarin --> Hct dropped to 18%; small AVM noted but no overt pathology.  [name] History of uterine cancer 2001   Status post hysterectomy  .  Hyperlipidemia with target LDL less than 70    Coronary disease and thoracic aortic atheroma  . Hypothyroidism    On Levothyroxine  . Paroxysmal atrial fibrillation ([place]) 2014   Associated with sick sinus syndrome. -- Intolerant of beta blockers and diltiazem. Rate control with digoxin. Not on anticoagulation despite CHA2DS2Vasc 6  2/2 prior severe GI bleed on triple therapy  . PFO (patent foramen ovale)    Noted on TEE - L-R shunt (elsewhere reported it as a ASD)  . Severe mitral regurgitation by prior echocardiogram 09/2014   Seen on TEE to have severe MR with multiple jets. Vena contracted 0.8; Consideration had been ? Mitral E-Clip - put on hold after GI Bleed.  . Sick sinus syndrome ([place])    Status post Loop Recorder: Med[employer]onic Linq -- most recent interrogation 05/19/2016: 2 new pauses. One greater than 3 seconds at 0 320 the morning. Second was 4.4 seconds at  9:56 PM; also noted to have an episode of A. fib. --> Recommended further follow-up upon arrival to [facility].  . Thoracic aortic atherosclerosis ([place]) 09/2014   Moderate grade 3-4 atheroma noted on TEE  . Urinary tract infection   . Vasovagal syncope    Exacerbated by bradycardia, sick sinus syndrome, and mitral regurgitation.  . Vertigo, central, unspecified laterality    Chronic dizziness.   Social History   Socioeconomic History  . Marital status: Married    Spouse name: Not on file  . Number of children: 2  . Years of education: Not on file  . Highest education level: Not on file  Occupational History    Comment: Retired Museum/gallery curator  Tobacco Use  . Smoking status: Former Smoker    Packs/day: 0.50    Years: 5.00    Pack years: 2.50    Types: Cigarettes    Start date: 1966    Quit date: 08/02/1970    Years since quitting: 49.2  . Smokeless tobacco: Never Used  Substance and Sexual Activity  . Alcohol use: Yes    Comment: "wine occasionally"  . Drug use: No  . Sexual activity: Not on file  Other Topics  Concern  . Not on file  Social History Narrative   Married for 73 years    Has a son, daughter died of breast cancer   One grandson    71 from [place] -( Lives there from May- October) to be closer to their son & only remaining family.   Social Determinants of Health   Financial Resource Strain:   . Difficulty of Paying Living Expenses: Not on file  Food Insecurity:   . Worried About Charity fundraiser in the Last Year: Not on file  . Ran Out of Food in the Last Year: Not on file  Transportation Needs:   . Lack of Transportation (Medical): Not on file  . Lack of Transportation (Non-Medical): Not on file  Physical Activity:   . Days of Exercise per Week: Not on file  . Minutes of Exercise per Session: Not on file  Stress:   . Feeling of Stress : Not on file  Social Connections:   . Frequency of Communication with Friends and Family: Not on file  . Frequency of Social Gatherings with Friends and Family: Not on file  . Attends Religious Services: Not on file  . Active Member of Clubs or Organizations: Not on file  . Attends Archivist Meetings: Not on file  . Marital Status: Not on file   Family History  Problem Relation Age of Onset  . Stomach cancer Father   . Cancer Brother   . Diabetes Brother   . Cancer Daughter    Scheduled Meds: . acetaminophen  325 mg Oral QHS  . amiodarone  100 mg Oral Daily  . aspirin EC  81 mg Oral Daily  . enoxaparin (LOVENOX) injection  40 mg Subcutaneous Daily  . ferrous sulfate  325 mg Oral BID WC  . levothyroxine  75 mcg Oral QAC breakfast  . sodium chloride flush  3 mL Intravenous Once  . sodium chloride flush  3 mL Intravenous Q12H   Continuous Infusions: . sodium chloride    . cefTRIAXone (ROCEPHIN)  IV 2 g (10/19/19 1107)  . metronidazole 500 mg (10/19/19 1333)   PRN Meds:.sodium chloride, ALPRAZolam, fluticasone, ketorolac, meclizine, ondansetron **OR** ondansetron (ZOFRAN) IV, sodium chloride flush Medications  Prior to Admission:  Prior to Admission medications  Medication Sig Start Date End Date Taking? Authorizing Provider  acetaminophen (TYLENOL) 325 MG tablet Take 325 mg by mouth at bedtime. daily    Yes [provider]  ALPRAZolam (XANAX) 0.25 MG tablet Take 1 tablet (0.25 mg total) by mouth at bedtime as needed for anxiety. 08/01/19  Yes Nafziger, Tommi Rumps, NP  amiodarone (PACERONE) 100 MG tablet Take 232m by mouth daily until Nov 02, 2019 then decrease to 1051mby mouth daily Patient taking differently: Take 100 mg by mouth daily.  10/10/19  Yes HaLeonie ManMD  aspirin EC 81 MG tablet Take 81 mg by mouth daily.   Yes [provider]  Carboxymethylcellul-Glycerin (REFRESH OPTIVE OP) Place 1 drop into both eyes daily as needed (dry eyes).   Yes [provider]  ferrous sulfate 325 (65 FE) MG tablet Take 325 mg by mouth 2 (two) times daily with a meal.    Yes [provider]  fluticasone (FLONASE) 50 MCG/ACT nasal spray Place 2 sprays into both nostrils daily as needed for allergies or rhinitis.    Yes [provider]  levothyroxine (SYNTHROID) 75 MCG tablet TAKE 1 TABLET BY MOUTH ONCE A DAY BEFORE BREAKFAST Patient taking differently: Take 75 mcg by mouth daily before breakfast. TAKE 1 TABLET BY MOUTH ONCE A DAY BEFORE BREAKFAST 10/09/19  Yes Nafziger, CoTommi RumpsNP  meclizine (ANTIVERT) 25 MG tablet Take 0.5-1 tablets (12.5-25 mg total) by mouth 3 (three) times daily as needed for up to 30 doses for dizziness. 10/06/19  Yes Kc, RaMaren BeachMD  omeprazole (PRILOSEC) 20 MG capsule TAKE 1 CAPSULE (20 MG TOTAL) BY MOUTH DAILY. 10/09/19  Yes Nafziger, CoTommi RumpsNP  polyethylene glycol (MIRALAX / GLYCOLAX) packet Take 8.5 g by mouth daily as needed for moderate constipation. Mix 1/2 scoop (8.5 g) in 8 oz orange juice and drink daily   Yes [provider]   Allergies  Allergen Reactions  . Carvedilol Other (See Comments)    Bradycardia   . Diltiazem Other (See  Comments)    Bradycardia  . Lopressor [Metoprolol] Other (See Comments)    Bradycardia   Review of Systems  Constitutional: Positive for activity change and fatigue.  Musculoskeletal: Positive for gait problem.  Neurological: Positive for dizziness and syncope.  Psychiatric/Behavioral: Positive for confusion.   Physical Exam Vitals and nursing note reviewed.  HENT:     Head: Normocephalic.     Mouth/Throat:     Mouth: Mucous membranes are moist.     Pharynx: Oropharynx is clear.  Eyes:     Extraocular Movements: Extraocular movements intact.  Cardiovascular:     Rate and Rhythm: Normal rate and regular rhythm.  Pulmonary:     Effort: Pulmonary effort is normal.  Abdominal:     General: Abdomen is flat. Bowel sounds are normal.     Palpations: Abdomen is soft.  Skin:    General: Skin is warm and dry.     Capillary Refill: Capillary refill takes less than 2 seconds.  Neurological:     Mental Status: She is alert.     Comments: Poor short term recall  Psychiatric:        Mood and Affect: Mood normal.    Vital Signs: BP (!) 154/59 (BP Location: Left Arm)   Pulse 73   Temp 97.6 F (36.4 C) (Oral)   Resp 20   Ht 5' (1.524 m)   Wt 52.3 kg   LMP  (LMP Unknown)   SpO2 100%   BMI  22.50 kg/m  Pain Scale: 0-10   Pain Score: 0-No pain  SpO2: SpO2: 100 % O2 Device:SpO2: 100 % O2 Flow Rate: .   IO: Intake/output summary:   Intake/Output Summary (Last 24 hours) at 10/19/2019 1334 Last data filed at 10/19/2019 1107 Gross per 24 hour  Intake 779.86 ml  Output 1525 ml  Net -745.14 ml   LBM: Last BM Date: (PTA) Baseline Weight: Weight: 52.3 kg Most recent weight: Weight: 52.3 kg     Palliative Assessment/Data:60%  Time In:1400 Time Out: 1510 Time Total: 70 Greater than 50%  of this time was spent counseling and coordinating care related to the above assessment and plan.  Signed by: Deborah Rumpf, NP   Please contact Palliative Medicine Team phone at  [phone] for questions and concerns.  For individual provider: See Shea Evans

## 2019-10-19 NOTE — Plan of Care (Signed)

## 2019-10-20 LAB — COMPREHENSIVE METABOLIC PANEL
ALT: 12 U/L (ref 0–44)
AST: 13 U/L — ABNORMAL LOW (ref 15–41)
Albumin: 2.5 g/dL — ABNORMAL LOW (ref 3.5–5.0)
Alkaline Phosphatase: 50 U/L (ref 38–126)
Anion gap: 8 (ref 5–15)
BUN: 9 mg/dL (ref 8–23)
CO2: 25 mmol/L (ref 22–32)
Calcium: 8.3 mg/dL — ABNORMAL LOW (ref 8.9–10.3)
Chloride: 103 mmol/L (ref 98–111)
Creatinine, Ser: 0.82 mg/dL (ref 0.44–1.00)
GFR calc Af Amer: >60 mL/min (ref >60)
GFR calc non Af Amer: >60 mL/min (ref >60)
Glucose, Bld: 94 mg/dL (ref 70–99)
Potassium: 3.7 mmol/L (ref 3.5–5.1)
Sodium: 136 mmol/L (ref 135–145)
Total Bilirubin: 0.4 mg/dL (ref 0.3–1.2)
Total Protein: 5.1 g/dL — ABNORMAL LOW (ref 6.5–8.1)

## 2019-10-20 LAB — CBC
HCT: 29.9 % — ABNORMAL LOW (ref 36.0–46.0)
Hemoglobin: 9.6 g/dL — ABNORMAL LOW (ref 12.0–15.0)
MCH: 30.1 pg (ref 26.0–34.0)
MCHC: 32.1 g/dL (ref 30.0–36.0)
MCV: 93.7 fL (ref 80.0–100.0)
Platelets: 390 10*3/uL (ref 150–400)
RBC: 3.19 MIL/uL — ABNORMAL LOW (ref 3.87–5.11)
RDW: 14.6 % (ref 11.5–15.5)
WBC: 4.8 10*3/uL (ref 4.0–10.5)
nRBC: 0 % (ref 0.0–0.2)

## 2019-10-20 LAB — MAGNESIUM: Magnesium: 2 mg/dL (ref 1.7–2.4)

## 2019-10-20 MED ORDER — ONDANSETRON HCL 4 MG PO TABS: 4.0000 mg | ORAL_TABLET | Freq: Four times a day (QID) | ORAL | 0 refills | Status: AC | PRN

## 2019-10-20 MED ORDER — AMIODARONE HCL 100 MG PO TABS: 100.0000 mg | ORAL_TABLET | Freq: Every day | ORAL | Status: DC

## 2019-10-20 MED ORDER — CEFUROXIME AXETIL 500 MG PO TABS: 500.0000 mg | ORAL_TABLET | Freq: Two times a day (BID) | ORAL | 0 refills | Status: AC

## 2019-10-20 MED ORDER — METRONIDAZOLE 500 MG PO TABS: 500.0000 mg | ORAL_TABLET | Freq: Three times a day (TID) | ORAL | 0 refills | Status: AC

## 2019-10-20 NOTE — Discharge Summary (Signed)
Physician Discharge Summary  Deborah [name] LKG:[id number] DOB: 1931/08/31 DOA: 10/18/2019  PCP: Shirline Frees, NP  Admit date: 10/18/2019 Discharge date: 10/20/2019  Admitted From: Home Disposition: Home  Recommendations for Outpatient Follow-up:  1. Follow up with PCP in 1 week with repeat CBC/BMP 2. Outpatient follow-up with cardiology and GI 3. Might benefit from outpatient palliative care evaluation and follow-up 4. Follow up in ED if symptoms worsen or new appear   Home Health: No.  Family refused home health PT due to concerns for Covid Equipment/Devices: None  Discharge Condition: Guarded to poor CODE STATUS: DNR Diet recommendation: Heart healthy  Brief/Interim Summary: 86 year old female with history of coronary artery disease status post PCI, chronic diastolic congestive heart failure, hypertension, hyperlipidemia, hypothyroidism, paroxysmal A. fib, sick sinus syndrome, vasovagal syncope, vertigo presented with 2 syncopal episodes along with abdominal pain and diarrhea.  She had recently finished treatment for UTI with cefdinir for 5 days as an outpatient.  In the ED, CT of the abdomen and pelvis showed mild thickening of the cecum and ascending colon raising the possibility of colitis; possibility of underlying colonic lesion is not entirely excluded and consider further evaluation with colonoscopy after resolution of acute symptomatology.  She was started on Flagyl.  Stool testing was ordered.  After hospitalization, she did not have any diarrhea hence stool testing was discontinued.  Palliative care consultation was requested for goals of care discussion.  Patient was evaluated by PT as well.  Family refused home health PT because of concerns for Covid.  She has remained stable.  She will be discharged on oral antibiotics.  Discharge Diagnoses:   Syncope, most likely vasovagal syncope along with dehydration -Presented with 2 syncopal episodes at home trying to go to the  bathroom -EKG on presentation did not show any acute ST-T wave changes -Treated with IV fluids. -No more syncopal episodes since admission. -Patient had cardiology/EP evaluation during recent hospitalization few weeks ago and husband had wished for no further invasive measures such as pacemaker placement placement. -Outpatient follow-up with cardiology  Probable colitis -Presented with abdominal pain and diarrhea. -CT of the abdomen and pelvis showed mild thickening of the cecum and ascending colon raising the possibility of colitis; possibility of underlying colonic lesion is not entirely excluded and consider further evaluation with colonoscopy after resolution of acute symptomatology.  -Has been empirically placed on Flagyl. -No diarrhea since admission.    Subsequently DC'd enteric precautions.   Started on Rocephin as well. -Tolerating diet.  Abdominal pain is improving. -We will discharge on 1 more week of oral Flagyl and Ceftin.  Might consider outpatient GI evaluation and follow-up.  Paroxysmal A. Fib Sick sinus syndrome -Continue aspirin and amiodarone.  Not on anticoagulation as per family wishes.  Outpatient follow-up with cardiology  Hypothyroidism -Continue Synthroid  Generalized conditioning -PT has evaluated the patient.  Family refuses home health PT due to concerns for Covid -Patient has had recurrent hospitalizations for the same reason.  Palliative care has evaluated the patient.   -She is DNR.  If condition worsens, recommend outpatient palliative care evaluation and follow-up.  Discharge Instructions  Discharge Instructions    Ambulatory referral to Cardiology   Complete by: As directed    Followup for syncope/tachybrady syndrome   Ambulatory referral to Gastroenterology   Complete by: As directed    Follow up for colitis   Diet - low sodium heart healthy   Complete by: As directed    Increase activity slowly   Complete by:  As directed      Allergies  as of 10/20/2019      Reactions   Carvedilol Other (See Comments)   Bradycardia   Diltiazem Other (See Comments)   Bradycardia   Lopressor [metoprolol] Other (See Comments)   Bradycardia      Medication List    TAKE these medications   ALPRAZolam 0.25 MG tablet Commonly known as: XANAX Take 1 tablet (0.25 mg total) by mouth at bedtime as needed for anxiety.   amiodarone 100 MG tablet Commonly known as: PACERONE Take 1 tablet (100 mg total) by mouth daily.   aspirin EC 81 MG tablet Take 81 mg by mouth daily.   cefUROXime 500 MG tablet Commonly known as: CEFTIN Take 1 tablet (500 mg total) by mouth 2 (two) times daily for 7 days.   ferrous sulfate 325 (65 FE) MG tablet Take 325 mg by mouth 2 (two) times daily with a meal.   fluticasone 50 MCG/ACT nasal spray Commonly known as: FLONASE Place 2 sprays into both nostrils daily as needed for allergies or rhinitis.   levothyroxine 75 MCG tablet Commonly known as: SYNTHROID TAKE 1 TABLET BY MOUTH ONCE A DAY BEFORE BREAKFAST What changed: See the new instructions.   meclizine 25 MG tablet Commonly known as: ANTIVERT Take 0.5-1 tablets (12.5-25 mg total) by mouth 3 (three) times daily as needed for up to 30 doses for dizziness.   metroNIDAZOLE 500 MG tablet Commonly known as: Flagyl Take 1 tablet (500 mg total) by mouth 3 (three) times daily for 7 days.   omeprazole 20 MG capsule Commonly known as: PRILOSEC TAKE 1 CAPSULE (20 MG TOTAL) BY MOUTH DAILY.   ondansetron 4 MG tablet Commonly known as: ZOFRAN Take 1 tablet (4 mg total) by mouth every 6 (six) hours as needed for nausea.   polyethylene glycol 17 g packet Commonly known as: MIRALAX / GLYCOLAX Take 8.5 g by mouth daily as needed for moderate constipation. Mix 1/2 scoop (8.5 g) in 8 oz orange juice and drink daily   REFRESH OPTIVE OP Place 1 drop into both eyes daily as needed (dry eyes).   Tylenol 325 MG tablet Generic drug: acetaminophen Take 325 mg by  mouth at bedtime. daily      Follow-up Information    Shirline FreesNafziger, [name], NP. Schedule an appointment as soon as possible for a visit in 1 week(s).   Specialty: Family Medicine Why: with repeat cbc/bmp Contact information: [address] [place] [address] [phone]        Marykay LexHarding, David W, MD .   Specialty: Cardiology Contact information: [address] [place] [address] [phone]          Allergies  Allergen Reactions  . Carvedilol Other (See Comments)    Bradycardia   . Diltiazem Other (See Comments)    Bradycardia  . Lopressor [Metoprolol] Other (See Comments)    Bradycardia    Consultations:  Palliative care   Procedures/Studies: CT Head Wo Contrast  Result Date: 10/03/2019 CLINICAL DATA:  Dizziness and weakness for 1 hour EXAM: CT HEAD WITHOUT CONTRAST TECHNIQUE: Contiguous axial images were obtained from the base of the skull through the vertex without intravenous contrast. COMPARISON:  07/02/2019 FINDINGS: Brain: No evidence of acute infarction, hemorrhage, hydrocephalus, extra-axial collection or mass lesion/mass effect. Diffuse atrophic and chronic white matter ischemic changes are again identified. Vascular: No hyperdense vessel or unexpected calcification. Skull: No focal fracture is noted. Heterogeneous attenuation of the skull is again identified  similar to that noted on the prior exam. Sinuses/Orbits: No acute finding. Other: None. IMPRESSION: Chronic atrophic and ischemic changes stable from the prior exam. No acute abnormality noted. Electronically Signed   By: Alcide Clever M.D.   On: 10/03/2019 23:35   CT ABDOMEN PELVIS W CONTRAST  Result Date: 10/18/2019 CLINICAL DATA:  Patient with abdominal pain and dizziness. EXAM: CT ABDOMEN AND PELVIS WITH CONTRAST TECHNIQUE: Multidetector CT imaging of the abdomen and pelvis was performed using the standard protocol following bolus administration of intravenous contrast.  CONTRAST:  OMNIPAQUE IOHEXOL 300 MG/ML  SOLN COMPARISON:  CTA CAP 10/03/2019 FINDINGS: Lower chest: Similar-appearing 2 cm tubular structure within the right lower lobe (image 7; series 4). Minimal dependent atelectasis. No pleural effusion. Hepatobiliary: Liver is normal in size and contour. No focal hepatic lesions identified. Gallbladder is unremarkable. Pancreas: There is a 7 mm cystic lesion within the uncinate process (image 26; series 3). No surrounding inflammatory change. Spleen: Unremarkable Adrenals/Urinary Tract: Normal adrenal glands. Kidneys are lobular in contour. Redemonstrated small hypoechoic lesions within the kidneys bilaterally, too small to characterize. 4 mm right renal stone versus vascular calcification (image 23; series 3). Urinary bladder is unremarkable. Stomach/Bowel: Sigmoid colonic diverticulosis. No CT evidence for acute diverticulitis. There is wall thickening of the cecum and ascending colon. No evidence for small bowel obstruction. No free fluid or free intraperitoneal air. Vascular/Lymphatic: Normal caliber abdominal aorta. Peripheral calcified atherosclerotic plaque. No retroperitoneal lymphadenopathy. Reproductive: Status post hysterectomy. Other: None. Musculoskeletal: Lower thoracic and lumbar spine degenerative changes. Similar-appearing mild compression fractures of the L1 and L2 vertebral bodies. Moderate L4 vertebral body compression fracture. IMPRESSION: 1. There is mild wall thickening of the cecum and ascending colon raising the possibility of colitis. Possibility of underlying colonic lesion is not entirely excluded. Consider further evaluation with colonoscopy after resolution of the acute symptomatology if not recently performed. 2. Lumbar spine wedge compression deformities. 3. Small (7 mm) cystic lesion within the uncinate process of the pancreas. Recommend follow-up pre and post contrast-enhanced MRI in 2 years to assess stability. 4. Tubular nodular  masslike structure within the right lower lobe which is nonspecific in etiology. Recommend follow-up chest CT in 6 months. Electronically Signed   By: Annia Belt M.D.   On: 10/18/2019 04:19   CARDIAC EVENT MONITOR  Result Date: 09/30/2019  Predominantly sinus rhythm with PACs. (Rates from 51-113 bpm). Average heart rate for 65 bpm.  24% A. fib with variable rates 50 to 252 bpm..  Rare PACs and PVCs noted.  10 manually detected episodes-6 in sinus rhythm with PACs, 4 with A. fib (not RVR)   Roughly 24% A. fib with rates running from 50 to 150 bpm.  Otherwise sinus rhythm with PACs.  No significant pauses noted.  Several short bursts of 3-4 beats PVCs versus aberrantly conducted PACs.  Symptoms noted for relatively benign findings of sinus rhythm with PACs or stable A. fib.  Not with rapid A. Fib. Bryan Lemma, MD  DG Chest Port 1 View  Result Date: 10/03/2019 CLINICAL DATA:  Syncope. Dizziness and weakness. EXAM: PORTABLE CHEST 1 VIEW COMPARISON:  Radiograph 12/30/2017 FINDINGS: Significant patient rotation. Low lung volumes, similar to prior exam. Mild cardiomegaly is grossly unchanged allowing for rotation. Aortic atherosclerosis. Implanted loop recorder in the left chest wall. No acute airspace disease, pulmonary edema, pneumothorax or large pleural effusion. Remote left clavicle fracture. Remote right rib fracture. IMPRESSION: Low lung volumes without acute abnormality. Aortic Atherosclerosis (ICD10-I70.0). Electronically Signed  By: Narda Rutherford M.D.   On: 10/03/2019 21:42   CT Angio Chest/Abd/Pel for Dissection W and/or Wo Contrast  Result Date: 10/03/2019 CLINICAL DATA:  Chest pain or back pain, aortic dissection suspected EXAM: CT ANGIOGRAPHY CHEST, ABDOMEN AND PELVIS TECHNIQUE: Multidetector CT imaging through the chest, abdomen and pelvis was performed using the standard protocol during bolus administration of intravenous contrast. Multiplanar reconstructed images and MIPs were  obtained and reviewed to evaluate the vascular anatomy. CONTRAST:  24mL OMNIPAQUE IOHEXOL 350 MG/ML SOLN. 100 cc Omnipaque 350 IV extravasated in the left upper extremity, evaluated by non interpreting radiologist. COMPARISON:  Radiograph earlier this day. FINDINGS: CTA CHEST FINDINGS Cardiovascular: Thoracic aorta is normal in caliber without dissection, aortic hematoma, or aneurysm. Moderate aortic atherosclerosis. The left subclavian artery is occluded at its origin, reconstituted approximately 2 cm distally. No pulmonary embolus to the lobar level. Heart is normal in size. There are coronary artery calcifications. No pericardial effusion. Mediastinum/Nodes: Small mediastinal and hilar lymph nodes. No pathologic adenopathy. No esophageal wall thickening. No visualized thyroid nodule. Lungs/Pleura: 14 mm serpiginous tubular structure in the anterior right upper lobe, series 11 image 60, likely a small AVM. There is an additional tubular serpiginous lesion in the right lower lobe measuring approximately 2 cm, also likely a small AVM. No confluent airspace disease. No evidence of pulmonary edema. No pleural fluid. Musculoskeletal: Extravasated IV contrast within the musculature of the left upper extremity. Severe compression fracture of T9 and T11, technically age indeterminate but likely chronic. Remote sternal fracture. Implanted loop recorder in the left chest wall. Remote posterior right rib fracture. Review of the MIP images confirms the above findings. CTA ABDOMEN AND PELVIS FINDINGS VASCULAR Aorta: Moderate atherosclerosis. No dissection, aneurysm, or evidence of vasculitis. Celiac: Splenic and hepatic arteries with separate origins from the aorta. Plaque at the origin of the common hepatic artery causes mild stenosis with poststenotic dilatation. SMA: Plaque at the origin but no significant stenosis. No dissection or evidence of vasculitis. Distal branch vessels are patent. Renals: Plaque at the origin of  both renal arteries, left greater than right. No dissection or aneurysm. No evidence of vasculitis. IMA: Patent. Inflow: Patent without aneurysm, dissection, vasculitis or severe stenosis. Moderate atherosclerosis. Veins: No obvious venous abnormality within the limitations of this arterial phase study. Review of the MIP images confirms the above findings. NON-VASCULAR Hepatobiliary: Minimal contrast refluxes into the hepatic veins and IVC. No focal hepatic lesion. Gallbladder is unremarkable. No biliary dilatation. Pancreas: No ductal dilatation or inflammation. Spleen: Normal in size and arterial enhancement. Adrenals/Urinary Tract: Mild left adrenal thickening without dominant nodule. Normal right adrenal gland. No hydronephrosis. No perinephric edema. Mild cortical scarring in the upper left kidney. Small bilateral renal cysts. Early excretion of IV contrast in both renal collecting systems likely from extravasated contrast material. Excreted IV contrast in the urinary bladder which is otherwise unremarkable. Stomach/Bowel: Stomach is nondistended. No small bowel wall thickening, inflammation or obstruction. Equivocal mild wall thickening of the ascending colon without pericolonic edema. Colonic diverticulosis without evidence of diverticulitis. Lymphatic: No adenopathy. Reproductive: Status post hysterectomy. No adnexal masses. Other: No free air, free fluid, or intra-abdominal fluid collection. Musculoskeletal: Mild compression fracture of L1 and L2, moderate compression fracture of L4. These are technically age indeterminate but likely chronic. Bones are diffusely under mineralized. Review of the MIP images confirms the above findings. IMPRESSION: CTA CHEST 1. No aortic dissection or acute aortic abnormality. Moderate aortic atherosclerosis. 2. 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 3. Probable small pulmonary AVM's in the right upper and right lower  lobes (1.4 cm in 2.0 cm respectively). No prior exams to evaluate for comparison. Consider follow-up chest CT with contrast in 6 months to evaluate for stability. 4. Coronary artery calcifications. 5. Equivocal mild wall thickening of the ascending colon without pericolonic edema. This may be secondary to colitis. 6. Compression fractures of T9, T11, L1, L2, and L4, technically age indeterminate but likely chronic. 7. Colonic diverticulosis without diverticulitis. 8. IV contrast extravasation in the left upper extremity. Aortic Atherosclerosis (ICD10-I70.0). Electronically Signed   By: Narda RutherfordMelanie  Sanford M.D.   On: 10/03/2019 23:49   VAS US CAROTID  Result Date: 10/05/2019 Carotid Arterial Duplex Study Indications:       Syncope and Dizziness. Risk Factors:      Left subclavian artery stenosis at origin by CTA.                    Hypertension, hyperlipidemia, coronary artery disease. Other Factors:     Paroxysmal atrial fibrillation, sick sinus syndrome/. Comparison Study:  No prior study on file for comparison Performing Technologist: Sherren Kernsandace Kanady RVS  Examination Guidelines: A complete evaluation includes B-mode imaging, spectral Doppler, color Doppler, and power Doppler as needed of all accessible portions of each vessel. Bilateral testing is considered an integral part of a complete examination. Limited examinations for reoccurring indications may be performed as noted.  Right Carotid Findings: +----------+--------+--------+--------+------------------+------------------+           PSV cm/sEDV cm/sStenosisPlaque DescriptionComments           +----------+--------+--------+--------+------------------+------------------+ CCA Prox  88      8                                 intimal thickening +----------+--------+--------+--------+------------------+------------------+ CCA Distal72      12                                intimal thickening  +----------+--------+--------+--------+------------------+------------------+ ICA Prox  113     24              heterogenous                         +----------+--------+--------+--------+------------------+------------------+ ICA Distal118     21                                tortuous           +----------+--------+--------+--------+------------------+------------------+ ECA       95      6                                                    +----------+--------+--------+--------+------------------+------------------+ +----------+--------+-------+--------+-------------------+           PSV cm/sEDV cmsDescribeArm Pressure (mmHG) +----------+--------+-------+--------+-------------------+ Subclavian170                                        +----------+--------+-------+--------+-------------------+ +---------+--------+--+--------+--+ VertebralPSV cm/s83EDV cm/s28 +---------+--------+--+--------+--+  Left Carotid Findings: +----------+--------+--------+--------+------------------+------------------+  PSV cm/sEDV cm/sStenosisPlaque DescriptionComments           +----------+--------+--------+--------+------------------+------------------+ CCA Prox  83      14                                intimal thickening +----------+--------+--------+--------+------------------+------------------+ CCA Distal79      14                                intimal thickening +----------+--------+--------+--------+------------------+------------------+ ICA Prox  143     29              calcific          Shadowing          +----------+--------+--------+--------+------------------+------------------+ ICA Distal123     20                                tortuous           +----------+--------+--------+--------+------------------+------------------+ ECA       96      8                                                     +----------+--------+--------+--------+------------------+------------------+ +----------+--------+--------+--------+-------------------+           PSV cm/sEDV cm/sDescribeArm Pressure (mmHG) +----------+--------+--------+--------+-------------------+ [id number]                                          +----------+--------+--------+--------+-------------------+ +---------+--------+--+--------+-+ VertebralPSV cm/s85EDV cm/s4 +---------+--------+--+--------+-+  Summary: Right Carotid: Velocities in the right ICA are consistent with a 1-39% stenosis. Left Carotid: Velocities in the left ICA are consistent with a 1-39% stenosis. Vertebrals:  Bilateral vertebral arteries demonstrate antegrade flow. Subclavians: Normal flow hemodynamics were seen in bilateral subclavian              arteries. *See table(s) above for measurements and observations.  Electronically signed by Antony Contras MD on 10/05/2019 at 10:30:48 AM.    Final        Subjective: Patient seen and examined at bedside.  She is a poor historian.  Looks anxious.  Denies any worsening abdominal pain.  No diarrhea since admission.  No overnight fever or vomiting reported.  Tolerating diet.  Discharge Exam: Vitals:   10/20/19 0424 10/20/19 0750  BP: 120/62   Pulse: 70   Resp: 15   Temp: 97.7 F (36.5 C) (!) 97.5 F (36.4 C)  SpO2: 96% 94%    General: Pt is elderly female lying in bed.  Looks chronically ill.  Looks very anxious. Cardiovascular: rate controlled, S1/S2 + Respiratory: bilateral decreased breath sounds at bases with some scattered crackles Abdominal: Soft, NT, ND, bowel sounds + Extremities: Trace lower extremity edema, no cyanosis    The results of significant diagnostics from this hospitalization (including imaging, microbiology, ancillary and laboratory) are listed below for reference.     Microbiology: Recent Results (from the past 240 hour(s))  SARS CORONAVIRUS 2 (TAT 6-24 HRS) Nasopharyngeal  Nasopharyngeal Swab     Status: None   Collection Time: 10/18/19  5:52 AM   Specimen:  Nasopharyngeal Swab  Result Value Ref Range Status   SARS Coronavirus 2 NEGATIVE NEGATIVE Final    Comment: (NOTE) SARS-CoV-2 target nucleic acids are NOT DETECTED. The SARS-CoV-2 RNA is generally detectable in upper and lower respiratory specimens during the acute phase of infection. Negative results do not preclude SARS-CoV-2 infection, do not rule out co-infections with other pathogens, and should not be used as the sole basis for treatment or other patient management decisions. Negative results must be combined with clinical observations, patient history, and epidemiological information. The expected result is Negative. Fact Sheet for Patients: HairSlick.no Fact Sheet for Healthcare Providers: quierodirigir.com This test is not yet approved or cleared by the [place] FDA and  has been authorized for detection and/or diagnosis of SARS-CoV-2 by FDA under an Emergency Use Authorization (EUA). This EUA will remain  in effect (meaning this test can be used) for the duration of the COVID-19 declaration under Section 56 4(b)(1) of the Act, 21 U.S.C. section 360bbb-3(b)(1), unless the authorization is terminated or revoked sooner. Performed at [facility], [address], [place], [place] [address]   Urine culture     Status: None   Collection Time: 10/18/19  5:53 AM   Specimen: Urine, Clean Catch  Result Value Ref Range Status   Specimen Description URINE, CLEAN CATCH  Final   Special Requests NONE  Final   Culture   Final    NO GROWTH Performed at [facility], [address], [place], [place] [address]    Report Status 10/19/2019 FINAL  Final  MRSA PCR Screening     Status: None   Collection Time: 10/18/19  7:02 AM   Specimen: Nasopharyngeal  Result Value Ref Range Status   MRSA by PCR NEGATIVE NEGATIVE Final     Comment:        The GeneXpert MRSA Assay (FDA approved for NASAL specimens only), is one component of a comprehensive MRSA colonization surveillance program. It is not intended to diagnose MRSA infection nor to guide or monitor treatment for MRSA infections. Performed at [facility], [address] [place], [place] [address]      Labs: BNP (last 3 results) Recent Labs    10/04/19 0019  BNP 295.1*   Basic Metabolic Panel: Recent Labs  Lab 10/18/19 0054 10/18/19 0319 10/19/19 0243 10/20/19 0252  NA 132*  --  135 136  K 4.4  --  3.8 3.7  CL 98  --  106 103  CO2 24  --  22 25  GLUCOSE 149*  --  83 94  BUN 11  --  8 9  CREATININE 0.76  --  0.71 0.82  CALCIUM 8.7*  --  8.0* 8.3*  MG  --  2.0 1.9 2.0   Liver Function Tests: Recent Labs  Lab 10/18/19 0054 10/19/19 0243 10/20/19 0252  AST 16 12* 13*  ALT 16 12 12   ALKPHOS 68 50 50  BILITOT 0.5 0.8 0.4  PROT 6.2* 4.7* 5.1*  ALBUMIN 3.2* 2.4* 2.5*   Recent Labs  Lab 10/18/19 0054  LIPASE 18   No results for input(s): AMMONIA in the last 168 hours. CBC: Recent Labs  Lab 10/18/19 0054 10/19/19 0243 10/20/19 0252  WBC 11.9* 5.4 4.8  HGB 11.4* 9.4* 9.6*  HCT 36.3 30.3* 29.9*  MCV 95.0 95.9 93.7  PLT 441* 383 390   Cardiac Enzymes: No results for input(s): CKTOTAL, CKMB, CKMBINDEX, TROPONINI in the last 168 hours. BNP: Invalid input(s):  POCBNP CBG: No results for input(s): GLUCAP in the last 168 hours. D-Dimer No results for input(s): DDIMER in the last 72 hours. Hgb A1c No results for input(s): HGBA1C in the last 72 hours. Lipid Profile No results for input(s): CHOL, HDL, LDLCALC, TRIG, CHOLHDL, LDLDIRECT in the last 72 hours. Thyroid function studies No results for input(s): TSH, T4TOTAL, T3FREE, THYROIDAB in the last 72 hours.  Invalid input(s): FREET3 Anemia work up No results for input(s): VITAMINB12, FOLATE, FERRITIN, TIBC, IRON, RETICCTPCT in the last 72 hours. Urinalysis     Component Value Date/Time   COLORURINE YELLOW 10/18/2019 0319   APPEARANCEUR CLEAR 10/18/2019 0319   LABSPEC 1.025 10/18/2019 0319   PHURINE 7.0 10/18/2019 0319   GLUCOSEU NEGATIVE 10/18/2019 0319   HGBUR SMALL (A) 10/18/2019 0319   BILIRUBINUR NEGATIVE 10/18/2019 0319   KETONESUR NEGATIVE 10/18/2019 0319   PROTEINUR NEGATIVE 10/18/2019 0319   NITRITE NEGATIVE 10/18/2019 0319   LEUKOCYTESUR NEGATIVE 10/18/2019 0319   Sepsis Labs Invalid input(s): PROCALCITONIN,  WBC,  LACTICIDVEN Microbiology Recent Results (from the past 240 hour(s))  SARS CORONAVIRUS 2 (TAT 6-24 HRS) Nasopharyngeal Nasopharyngeal Swab     Status: None   Collection Time: 10/18/19  5:52 AM   Specimen: Nasopharyngeal Swab  Result Value Ref Range Status   SARS Coronavirus 2 NEGATIVE NEGATIVE Final    Comment: (NOTE) SARS-CoV-2 target nucleic acids are NOT DETECTED. The SARS-CoV-2 RNA is generally detectable in upper and lower respiratory specimens during the acute phase of infection. Negative results do not preclude SARS-CoV-2 infection, do not rule out co-infections with other pathogens, and should not be used as the sole basis for treatment or other patient management decisions. Negative results must be combined with clinical observations, patient history, and epidemiological information. The expected result is Negative. Fact Sheet for Patients: HairSlick.no Fact Sheet for Healthcare Providers: quierodirigir.com This test is not yet approved or cleared by the [place] FDA and  has been authorized for detection and/or diagnosis of SARS-CoV-2 by FDA under an Emergency Use Authorization (EUA). This EUA will remain  in effect (meaning this test can be used) for the duration of the COVID-19 declaration under Section 56 4(b)(1) of the Act, 21 U.S.C. section 360bbb-3(b)(1), unless the authorization is terminated or revoked sooner. Performed at [facility], [address] [place], [place] [address]   Urine culture     Status: None   Collection Time: 10/18/19  5:53 AM   Specimen: Urine, Clean Catch  Result Value Ref Range Status   Specimen Description URINE, CLEAN CATCH  Final   Special Requests NONE  Final   Culture   Final    NO GROWTH Performed at [facility], [address] [place], [place] [address]    Report Status 10/19/2019 FINAL  Final  MRSA PCR Screening     Status: None   Collection Time: 10/18/19  7:02 AM   Specimen: Nasopharyngeal  Result Value Ref Range Status   MRSA by PCR NEGATIVE NEGATIVE Final    Comment:        The GeneXpert MRSA Assay (FDA approved for NASAL specimens only), is one component of a comprehensive MRSA colonization surveillance program. It is not intended to diagnose MRSA infection nor to guide or monitor treatment for MRSA infections. Performed at [facility], [address] [place], [place] [address]      Time coordinating discharge: 35 minutes  SIGNED:   Glade Lloyd, MD  [facility] 10/20/2019, 9:54  AM    

## 2019-10-20 NOTE — Progress Notes (Signed)
Discharged home accompanied by husband, discharge instructions  given to husband. Belongings taken home.

## 2019-10-21 ENCOUNTER — Telehealth: Payer: Self-pay

## 2019-10-21 ENCOUNTER — Encounter: Payer: Self-pay | Admitting: Adult Health

## 2019-10-21 NOTE — Telephone Encounter (Signed)
Transition Care Management Follow-up Telephone Call  Date of discharge and from where: 10/20/19 from [facility]  How have you been since you were released from the hospital? "She's very tired and has little appetite".  Any questions or concerns? No   Items Reviewed:  Did the pt receive and understand the discharge instructions provided? Yes   Medications obtained and verified? Yes   Any new allergies since your discharge? No   Dietary orders reviewed? Yes  Do you have support at home? Yes   Other (ie: DME, Home Health, etc) no; family declined home health PT due to covid risk.  Functional Questionnaire: (I = Independent and D = Dependent) ADL's: Husband is helping her with things  Bathing/Dressing-    Meal Prep-   Eating-   Maintaining continence-   Transferring/Ambulation-   Managing Meds-    Follow up appointments reviewed:    PCP Hospital f/u appt confirmed? Yes  Scheduled to see Kandee Keen on 10/28/19 1:00 in the office  Specialist Hospital f/u appt confirmed? Yes  Scheduled to see  GI on 10/23/19.  Are transportation arrangements needed? No   If their condition worsens, is the pt aware to call  their PCP or go to the ED? Yes  Was the patient provided with contact information for the PCP's office or ED? Yes  Was the pt encouraged to call back with questions or concerns? Yes

## 2019-10-22 NOTE — Telephone Encounter (Signed)
Please advise 

## 2019-10-23 ENCOUNTER — Ambulatory Visit: Payer: Medicare HMO | Admitting: Physician Assistant

## 2019-10-23 ENCOUNTER — Other Ambulatory Visit: Payer: Self-pay

## 2019-10-23 ENCOUNTER — Other Ambulatory Visit (INDEPENDENT_AMBULATORY_CARE_PROVIDER_SITE_OTHER): Payer: Medicare HMO

## 2019-10-23 ENCOUNTER — Encounter: Payer: Self-pay | Admitting: Physician Assistant

## 2019-10-23 VITALS — BP 118/52 | HR 76 | Temp 97.8°F | Ht <= 58 in | Wt 116.5 lb

## 2019-10-23 DIAGNOSIS — K529 Noninfective gastroenteritis and colitis, unspecified: Secondary | ICD-10-CM

## 2019-10-23 DIAGNOSIS — D508 Other iron deficiency anemias: Secondary | ICD-10-CM | POA: Diagnosis not present

## 2019-10-23 LAB — CBC WITH DIFFERENTIAL/PLATELET
Basophils Absolute: 0.1 10*3/uL (ref 0.0–0.1)
Basophils Relative: 1 % (ref 0.0–3.0)
Eosinophils Absolute: 0.1 10*3/uL (ref 0.0–0.7)
Eosinophils Relative: 2.3 % (ref 0.0–5.0)
HCT: 32.7 % — ABNORMAL LOW (ref 36.0–46.0)
Hemoglobin: 10.8 g/dL — ABNORMAL LOW (ref 12.0–15.0)
Lymphocytes Relative: 9.2 % — ABNORMAL LOW (ref 12.0–46.0)
Lymphs Abs: 0.6 10*3/uL — ABNORMAL LOW (ref 0.7–4.0)
MCHC: 32.9 g/dL (ref 30.0–36.0)
MCV: 92.2 fl (ref 78.0–100.0)
Monocytes Absolute: 0.6 10*3/uL (ref 0.1–1.0)
Monocytes Relative: 9.3 % (ref 3.0–12.0)
Neutro Abs: 5.1 10*3/uL (ref 1.4–7.7)
Neutrophils Relative %: 78.2 % — ABNORMAL HIGH (ref 43.0–77.0)
Platelets: 457 10*3/uL — ABNORMAL HIGH (ref 150.0–400.0)
RBC: 3.55 Mil/uL — ABNORMAL LOW (ref 3.87–5.11)
RDW: 15.6 % — ABNORMAL HIGH (ref 11.5–15.5)
WBC: 6.5 10*3/uL (ref 4.0–10.5)

## 2019-10-23 LAB — BASIC METABOLIC PANEL
BUN: 7 mg/dL (ref 6–23)
CO2: 27 mEq/L (ref 19–32)
Calcium: 8.6 mg/dL (ref 8.4–10.5)
Chloride: 99 mEq/L (ref 96–112)
Creatinine, Ser: 0.71 mg/dL (ref 0.40–1.20)
GFR: 77.94 mL/min (ref >60.00)
Glucose, Bld: 99 mg/dL (ref 70–99)
Potassium: 3.7 mEq/L (ref 3.5–5.1)
Sodium: 132 mEq/L — ABNORMAL LOW (ref 135–145)

## 2019-10-23 NOTE — Patient Instructions (Addendum)
If you are age 84 or older, your body mass index should be between 23-30. Your Body mass index is 24.35 kg/m. If this is out of the aforementioned range listed, please consider follow up with your Primary Care Provider.  If you are age 47 or younger, your body mass index should be between 19-25. Your Body mass index is 24.35 kg/m. If this is out of the aformentioned range listed, please consider follow up with your Primary Care Provider.   Please finish your current antibiotic. Start with Imodium AD  Push fluids  We have contact [facility] to schedule and appointment with Dr Herbie Baltimore, your cardiologist. We will contact you with an appointment time  Please call back in 2 weeks to schedule a follow up with Mike Gip, PA-C

## 2019-10-23 NOTE — Progress Notes (Signed)
Subjective:    Patient ID: Deborah [name], female    DOB: [birth year], 86 y.o.   MRN: [id number]  HPI Deborah [name] is a pleasant 86 year old white female, new to GI today referred after recent hospitalization for GI follow-up.  Patient has history of hypertension, coronary artery disease status post drug-eluting stent, sick sinus syndrome, atrial fibrillation, congestive heart failure, chronic anemia. She was admitted 1/30 through 10/20/2019 and cared for by the [facility].  She had presented after 2 syncopal episodes.  On further questioning she had complained of some abdominal pain and diarrhea.  She had been on an antibiotic for urinary tract infection prior to admission. She underwent CT scan of the abdomen and pelvis which showed mild thickening of the cecum and ascending colon consistent with a mild colitis, could not rule out underlying lesion. She was continued on Ceftin and had Flagyl added for the colitis. Interestingly she had no diarrhea while hospitalized so stool studies were not able to be obtained. She had also undergone CT angio of the chest and abdomen on 10/03/2019 for complaints of back pain and concerns for potential dissection.  This showed severe compression fractures T9 T11 and mild compression fractures L1-L2.  From a GI perspective there was no significant stenosis of the SMA there was mild stenosis of the common hepatic and at that time it was also noted that she had some mild wall thickening of the ascending colon and a mild colitis was questioned.  There is no pericolonic edema.  Patient's husband gives most of the history today.  She has not been complaining of any abdominal pain since discharge from the hospital.  Her husband says she had not been feeling well for a few days prior to that admission, had not been eating well and had had some mild diarrhea.  She had not been complaining of any abdominal pain.  He did note that her stools have been very dark prior to  admission.  She is now on iron supplementation and completing her course of antibiotics.  She is having diarrheal stools 2-4 times per day.  She denies any abdominal pain or cramping.  No nausea or vomiting Stools dark but nonbloody on iron.  Her husband says she has been eating very poorly and has no appetite. She has been having a lot of dizzy spells at home, but apparently this has been an issue over the past couple of months.  He reports that she had prior colonoscopy in 2012 while they were still living in [place].  He has some notes on that on his phone apparently she was noted to have diverticulosis and did have a polyp and she was supposed to have a follow-up exam in 2 to 3 years but they did not pursue that.   Last labs on 10/19/2019 showed WBC of 5.4 hemoglobin 9.4 .  Patient was able to get up on the exam table today, but after changing position developed some significant dizziness which persisted.  Her husband confirmed that she has been doing this on a regular basis at home.  Vitals have been stable  Review of Systems Pertinent positive and negative review of systems were noted in the above HPI section.  All other review of systems was otherwise negative.  Outpatient Encounter Medications as of 10/23/2019  Medication Sig  . acetaminophen (TYLENOL) 325 MG tablet Take 325 mg by mouth at bedtime. daily   . ALPRAZolam (XANAX) 0.25 MG tablet Take 1 tablet (0.25 mg total)  by mouth at bedtime as needed for anxiety.  [name] amiodarone (PACERONE) 100 MG tablet Take 1 tablet (100 mg total) by mouth daily.  [name] aspirin EC 81 MG tablet Take 81 mg by mouth daily.  . Carboxymethylcellul-Glycerin (REFRESH OPTIVE OP) Place 1 drop into both eyes daily as needed (dry eyes).  . cefUROXime (CEFTIN) 500 MG tablet Take 1 tablet (500 mg total) by mouth 2 (two) times daily for 7 days.  . ferrous sulfate 325 (65 FE) MG tablet Take 325 mg by mouth 2 (two) times daily with a meal.   . fluticasone  (FLONASE) 50 MCG/ACT nasal spray Place 2 sprays into both nostrils daily as needed for allergies or rhinitis.   [name] levothyroxine (SYNTHROID) 75 MCG tablet TAKE 1 TABLET BY MOUTH ONCE A DAY BEFORE BREAKFAST (Patient taking differently: Take 75 mcg by mouth daily before breakfast. TAKE 1 TABLET BY MOUTH ONCE A DAY BEFORE BREAKFAST)  . meclizine (ANTIVERT) 25 MG tablet Take 0.5-1 tablets (12.5-25 mg total) by mouth 3 (three) times daily as needed for up to 30 doses for dizziness.  . metroNIDAZOLE (FLAGYL) 500 MG tablet Take 1 tablet (500 mg total) by mouth 3 (three) times daily for 7 days.  [name] omeprazole (PRILOSEC) 20 MG capsule TAKE 1 CAPSULE (20 MG TOTAL) BY MOUTH DAILY.  [name] ondansetron (ZOFRAN) 4 MG tablet Take 1 tablet (4 mg total) by mouth every 6 (six) hours as needed for nausea. (Patient not taking: Reported on 10/23/2019)  . polyethylene glycol (MIRALAX / GLYCOLAX) packet Take 8.5 g by mouth daily as needed for moderate constipation. Mix 1/2 scoop (8.5 g) in 8 oz orange juice and drink daily   No facility-administered encounter medications on file as of 10/23/2019.   Allergies  Allergen Reactions  . Carvedilol Other (See Comments)    Bradycardia   . Diltiazem Other (See Comments)    Bradycardia  . Lopressor [Metoprolol] Other (See Comments)    Bradycardia   Patient Active Problem List   Diagnosis Date Noted  . Palliative care by specialist   . Goals of care, counseling/discussion   . Colitis 10/18/2019  . Syncope 10/04/2019  . Irregular heart rate 10/04/2019  . Hypotension 10/04/2019  . Generalized weakness 10/04/2019  . AMS (altered mental status) 10/04/2019  . Atrial fibrillation with RVR ([place]) 10/04/2019  . Encounter for screening for COVID-19 09/25/2019  . Leg swelling 01/13/2019  . Vertigo 12/31/2017  . Bleeding into the skin 10/22/2017  . Tremor 07/03/2017  . Near syncope 07/03/2017  . Vasovagal syncope   . Hyperlipidemia with target LDL less than 70   . SSS (sick sinus  syndrome) ([place]) 07/31/2016  . CAD S/P DES PCI to RCA 07/31/2016  . Anemia 07/31/2016  . Essential hypertension 07/13/2016  . Hypothyroidism 07/13/2016  . Coronary artery disease involving native heart without angina pectoris 12/02/2014  . Mitral regurgitation and aortic stenosis 09/30/2014  . Thoracic aortic atherosclerosis ([place]) 09/18/2014  . Chronic diastolic heart failure due to valvular disease ([place]) 09/18/2013  . Paroxysmal atrial fibrillation ([place]): CHA2DS2Vasc = 6; Not currently on [facility]. 09/18/2012   Social History   Socioeconomic History  . Marital status: Married    Spouse name: Not on file  . Number of children: 2  . Years of education: Not on file  . Highest education level: Not on file  Occupational History  . Occupation: retired    Comment: Retired Museum/gallery curator  Tobacco Use  . Smoking status: Former Smoker    Packs/day: 0.50  Years: 5.00    Pack years: 2.50    Types: Cigarettes    Start date: 48    Quit date: 08/02/1970    Years since quitting: 49.2  . Smokeless tobacco: Never Used  Substance and Sexual Activity  . Alcohol use: Yes    Comment: "wine occasionally"  . Drug use: No  . Sexual activity: Not on file  Other Topics Concern  . Not on file  Social History Narrative   Married for 59 years    Has a son, daughter died of breast cancer   One grandson    Moved from [place] -( Lives there from May- October) to be closer to their son & only remaining family.   Social Determinants of Health   Financial Resource Strain:   . Difficulty of Paying Living Expenses: Not on file  Food Insecurity:   . Worried About Programme researcher, broadcasting/film/video in the Last Year: Not on file  . Ran Out of Food in the Last Year: Not on file  Transportation Needs:   . Lack of Transportation (Medical): Not on file  . Lack of Transportation (Non-Medical): Not on file  Physical Activity:   . Days of Exercise per Week: Not on file  . Minutes of Exercise per Session: Not on file  Stress:    . Feeling of Stress : Not on file  Social Connections:   . Frequency of Communication with Friends and Family: Not on file  . Frequency of Social Gatherings with Friends and Family: Not on file  . Attends Religious Services: Not on file  . Active Member of Clubs or Organizations: Not on file  . Attends Banker Meetings: Not on file  . Marital Status: Not on file  Intimate Partner Violence:   . Fear of Current or Ex-Partner: Not on file  . Emotionally Abused: Not on file  . Physically Abused: Not on file  . Sexually Abused: Not on file    Deborah [name]'s family history includes Cancer in her brother and daughter; Diabetes in her brother; Stomach cancer in her father.      Objective:    Vitals:   10/23/19 1040  BP: (!) 118/52  Pulse: 76  Temp: 97.8 F (36.6 C)    Physical Exam Well-developed very frail-appearing elderly white female in no acute distress.  Accompanied  by her husband  Weight, 116 BMI 24.3 patient able to answer questions but deferred to her husband to gives most of history  HEENT; nontraumatic normocephalic, EOMI, PER R LA, sclera anicteric. Oropharynx; not examined Neck; supple, no JVD Cardiovascular; regular rate and rhythm with S1-S2, no murmur rub or gallop Pulmonary; Clear bilaterally Abdomen; soft, nontender, nondistended, no palpable mass or hepatosplenomegaly, bowel sounds are active Rectal; not done today Skin; benign exam, no jaundice rash or appreciable lesions Extremities; no clubbing cyanosis or edema skin warm and dry Neuro/Psych; alert and oriented x4, grossly nonfocal mood and affect appropriate       Assessment & Plan:   #32 86 year old frail white female referred after recent admission for evaluation of possible colitis noted on CT scan with mild right colon thickening had also been noted on a CT angio earlier in January.  Patient had been having mild diarrhea which stopped during hospitalization and has now recurred,  however she is also on Ceftin and metronidazole. Abdominal exam is benign  Mesenteric vessels were patent on the recent CT angio.  She may have a mild underlying colitis, etiology not  clear.  #2 frequent dizziness, and 2  recent syncopal episodes  #3 coronary artery disease status post stent 4.  Congestive heart failure 5.  History of atrial fibrillation 6.  Sick sinus syndrome 7.  Hypertension  Plan; patient is a poor candidate for Colonoscopy, I do not think she would tolerate a prep at this time, certainly not at home.  Her husband is not in favor of aggressive work-up at this time.  Will complete current course of Ceftin and metronidazole CBC, BMET sed rate and CRP today Encouraged to push oral fluids. We will plan to see her in follow-up in about 2 weeks and reassess. Patient will be established with Dr. Vickey Sages PA-C 10/23/2019   Cc: Glade Lloyd, MD

## 2019-10-23 NOTE — Patient Outreach (Addendum)
Red Emmi: Reason for alert:  Do you know who to call about changes in condition:  Answer no  Placed call to patient for EMMI follow up. No answer at home or cell phone.  Noted patient at GI office today.  PLAN: Will call patient again tomorrow.  Rowe Pavy, RN, BSN, CEN [facility] [employer] [phone]

## 2019-10-24 ENCOUNTER — Ambulatory Visit: Payer: Medicare HMO | Attending: Internal Medicine

## 2019-10-24 ENCOUNTER — Other Ambulatory Visit: Payer: Self-pay

## 2019-10-24 DIAGNOSIS — Z23 Encounter for immunization: Secondary | ICD-10-CM | POA: Insufficient documentation

## 2019-10-24 NOTE — Patient Outreach (Signed)
Red emmi:  Placed 2nd outreach attempt to patient. Spoke with patient who gave me permission to speak with husband Authur.  Spoke with husband who states they know to call primary MD. Problem is you have to go through 5 layers of staff to get to the doctor.  Encouraged my chart messages to MD. Also encouraged calling the office and speaking with a nurse.  I reviewed with husband that I knew this was not an instant fix but MD offices normally respond in a timely manner.  Reviewed with husband when and if patient has an emergency to call 911.   Husband reports patient and he are going to get their covid vaccine today.  PLAN: denied any other needs. I provided my contact information for future if husband needs to call me.  Case closed.  Rowe Pavy, RN, BSN, CEN [facility] [employer] [phone]

## 2019-10-24 NOTE — Progress Notes (Signed)
   Covid-19 Vaccination Clinic  Name:  Angellina Ferdinand    MRN: [id number] DOB: December 01, 1930  10/24/2019  Ms. Crosland was observed post Covid-19 immunization for 15 minutes without incidence. She was provided with Vaccine Information Sheet and instruction to access the V-Safe system.   Ms. Gunnerson was instructed to call 911 with any severe reactions post vaccine: [name] Difficulty breathing  . Swelling of your face and throat  . A fast heartbeat  . A bad rash all over your body  . Dizziness and weakness    Immunizations Administered    Name Date Dose VIS Date Route   Pfizer COVID-19 Vaccine 10/24/2019  2:46 PM 0.3 mL 08/29/2019 Intramuscular   Manufacturer: [employer], [employer]   Lot: [id number]   NDC: [id number]

## 2019-10-27 ENCOUNTER — Telehealth: Payer: Self-pay | Admitting: Cardiology

## 2019-10-27 ENCOUNTER — Other Ambulatory Visit: Payer: Self-pay

## 2019-10-27 ENCOUNTER — Telehealth: Payer: Self-pay | Admitting: General Surgery

## 2019-10-27 NOTE — Telephone Encounter (Signed)
-----   Message from Edwin Cap, CMA sent at 10/23/2019 12:05 PM EST ----- Regarding: Dr Herbie Baltimore Schedule the patient for a consult ASAP and a follow up with Amy

## 2019-10-27 NOTE — Telephone Encounter (Signed)
-----   Message from Ashaunti Treptow A Vasili Fok, CMA sent at 10/23/2019 12:05 PM EST ----- Regarding: Dr Harding Schedule the patient for a consult ASAP and a follow up with Amy  

## 2019-10-27 NOTE — Telephone Encounter (Signed)
I spoke with Chrisitne at Dr Erich Montane office and they will call the patient to schedule a follow up.

## 2019-10-27 NOTE — Telephone Encounter (Signed)
Scheduled patient for in-office visit per GI request with MD on 2/10 @ 3:40pm - arranged with husband. Asked that if she checks BP/pulse, to bring list to appt.

## 2019-10-27 NOTE — Telephone Encounter (Signed)
New message  Pt c/o Syncope: STAT if syncope occurred within 30 minutes and pt complains of lightheadedness High Priority if episode of passing out, completely, today or in last 24 hours   1. Did you pass out today? Per French Ana does not know   2. When is the last time you passed out? Per French Ana has had 2 recent episodes   3. Has this occurred multiple times?yes   4. Did you have any symptoms prior to passing out? Dizziness, Colitis,    Please call the patient to discuss per French Ana.

## 2019-10-28 ENCOUNTER — Other Ambulatory Visit: Payer: Self-pay

## 2019-10-28 ENCOUNTER — Ambulatory Visit: Payer: Medicare HMO | Admitting: Adult Health

## 2019-10-28 ENCOUNTER — Encounter: Payer: Self-pay | Admitting: Adult Health

## 2019-10-28 DIAGNOSIS — R55 Syncope and collapse: Secondary | ICD-10-CM | POA: Diagnosis not present

## 2019-10-28 DIAGNOSIS — R42 Dizziness and giddiness: Secondary | ICD-10-CM | POA: Diagnosis not present

## 2019-10-28 DIAGNOSIS — K529 Noninfective gastroenteritis and colitis, unspecified: Secondary | ICD-10-CM

## 2019-10-28 DIAGNOSIS — F419 Anxiety disorder, unspecified: Secondary | ICD-10-CM | POA: Diagnosis not present

## 2019-10-28 DIAGNOSIS — R627 Adult failure to thrive: Secondary | ICD-10-CM

## 2019-10-28 MED ORDER — ALPRAZOLAM 0.25 MG PO TABS: 0.2500 mg | ORAL_TABLET | Freq: Every evening | ORAL | 1 refills | Status: DC | PRN

## 2019-10-28 MED ORDER — MECLIZINE HCL 25 MG PO TABS: 12.5000 mg | ORAL_TABLET | Freq: Three times a day (TID) | ORAL | 1 refills | Status: AC | PRN

## 2019-10-28 NOTE — Progress Notes (Signed)
Subjective:    Patient ID: Deborah [name], female    DOB: [birth year], 86 y.o.   MRN: [id number]  HPI 86 year old female who  has a past medical history of Anxiety, Bradycardia, CAD S/P percutaneous coronary angioplasty (11/2014), Chronic diastolic heart failure due to valvular disease (HCC) (2015), Diverticulitis, Essential hypertension, H/O: upper GI bleed (12/03/2014), History of uterine cancer (2001), Hyperlipidemia with target LDL less than 70, Hypothyroidism, Paroxysmal atrial fibrillation (HCC) (2014), PFO (patent foramen ovale), Severe mitral regurgitation by prior echocardiogram (09/2014), Sick sinus syndrome [facility]), Thoracic aortic atherosclerosis (HCC) (09/2014), Urinary tract infection, Vasovagal syncope, and Vertigo, central, unspecified laterality.  She presents with her husband today for TCM visit   Admit Date: 10/18/2019 Discharge Date:10/20/2019  She presented to the emergency room with 2 syncopal episodes after going to the bathroom, along with abdominal pain and diarrhea.  She had recently finished treatment for UTI with cefdinir for 5 days as an outpatient.  In the ED, her CT of the abdomen and pelvis showed mild thickening of the cecum and ascending colon raising the possibility of colitis; possibly of underlying colonic lesion is not entirely excluded and consider further evaluation with colonoscopy after resolution of acute symptoms.  She was started on Flagyl.  Stool testing was ordered.  After hospitalization she did not have any diarrhea hence stool testing was discontinued.  She was able to remain stable and was discharged on oral antibiotics  Hospital Course  Syncope -Thought to be most likely vasovagal syncope along with dehydration. -Her EKG on presentation did not show any acute ST-T wave changes -She was treated with IV fluids and did not have any additional syncopal episodes during admission -Of note the patient did have a cardiology/EP evaluation during her recent  hospitalization a few weeks ago.  Her husband wished no further invasive measures such as a pacemaker placement -She was advised to follow-up with cardiology outpatient  Probable Colitis  -As mentioned above she had a CT scan of the abdomen and pelvis which showed mild thickening of the cecum and ascending colon raising the possibility of colitis; possibility of underlying colonic lesion is not entirely excluded.  She had been empirically placed on Flagyl.  She did not have any diarrhea since admission and she was started on Rocephin as well.  She was tolerating diet in the hospital and abdominal pain was improving she was discharged on 1 week of Flagyl and Ceftin. Consider outpatient GI evaluation  Paroxysmal A fib/SSS -Continue aspirin and amiodarone.  Not currently on any anticoagulation.  Advised to follow-up with outpatient cardiology  Hypothyroidism -Continue Synthroid  Generalized deconditioning -PTevaluated the patient in the hospital.  Family refused home health PT due to concern of Covid. -Recommend possible palliative care evaluation as outpatient   She was seen by gastroenterology outpatient on 10/23/2019.  At this time she denied abdominal pain or cramping but was still having diarrhea 2-4 times per day.  Discussed that she would be a poor candidate for colonoscopy as she would not likely be able to tolerate the prep nor the anesthesia.  Her husband was in agreement for nonaggressive work-up.  She completed her antibiotic therapy yesterday, continues to deny abdominal pain or cramping.  Her diarrhea has ceased to once or twice per day.  She continues to be fatigued and sleeps throughout the day and has trouble at night falling asleep.  Appetite continues to be poor but they have recently started with Ensure drinks and she enjoys drinking this, she  will have approximately 2 a day.  She denies syncope since being discharged from the hospital   She needs a refill of meclizine for  dizziness as well as Xanax that she takes very infrequently for anxiety   Review of Systems  See HPI   Past Medical History:  Diagnosis Date  . Anxiety   . Bradycardia   . CAD S/P percutaneous coronary angioplasty 11/2014   DES PCI to RCA. Cath was done for pre-operative evaluation for mitral repair; LM 30%, LAD 50%, RCA 90% (Promus DES)  . 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 Normal EF by echo.  . Diverticulitis   . Essential hypertension   . H/O: upper GI bleed 12/03/2014   While on ASA/Plavix & Warfarin --> Hct dropped to 18%; small AVM noted but no overt pathology.  [name] History of uterine cancer 2001   Status post hysterectomy  . Hyperlipidemia with target LDL less than 70    Coronary disease and thoracic aortic atheroma  . Hypothyroidism    On Levothyroxine  . Paroxysmal atrial fibrillation (HCC) 2014   Associated with sick sinus syndrome. -- Intolerant of beta blockers and diltiazem. Rate control with digoxin. Not on anticoagulation despite CHA2DS2Vasc 6  2/2 prior severe GI bleed on triple therapy  . PFO (patent foramen ovale)    Noted on TEE - L-R shunt (elsewhere reported it as a ASD)  . Severe mitral regurgitation by prior echocardiogram 09/2014   Seen on TEE to have severe MR with multiple jets. Vena contracted 0.8; Consideration had been ? Mitral E-Clip - put on hold after GI Bleed.  . Sick sinus syndrome (HCC)    Status post Loop Recorder: Medtronic Linq -- most recent interrogation 05/19/2016: 2 new pauses. One greater than 3 seconds at 0 320 the morning. Second was 4.4 seconds at 9:56 PM; also noted to have an episode of A. fib. --> Recommended further follow-up upon arrival to [facility]  . Thoracic aortic atherosclerosis (HCC) 09/2014   Moderate grade 3-4 atheroma noted on TEE  . Urinary tract infection   . Vasovagal syncope    Exacerbated by bradycardia, sick sinus syndrome, and mitral  regurgitation.  . Vertigo, central, unspecified laterality    Chronic dizziness.    Social History   Socioeconomic History  . Marital status: Married    Spouse name: Not on file  . Number of children: 2  . Years of education: Not on file  . Highest education level: Not on file  Occupational History  . 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 Smoking status: Former Smoker    Packs/day: 0.50    Years: 5.00    Pack years: 2.50    Types: Cigarettes    Start date: 1966    Quit date: 08/02/1970    Years since quitting: 49.2  . Smokeless tobacco: Never Used  Substance and Sexual Activity  . Alcohol use: Yes    Comment: "wine occasionally"  . Drug use: No  . Sexual activity: Not on file  Other Topics Concern  . Not on file  Social History Narrative   Married for 59 years    Has a son, daughter died of breast cancer   One grandson    Moved from [place] -( Lives there from May- October) to be closer to their son & only remaining family.   Social Determinants of Health  Financial Resource Strain:   . Difficulty of Paying Living Expenses: Not on file  Food Insecurity:   . Worried About Charity fundraiser in the Last Year: Not on file  . Ran Out of Food in the Last Year: Not on file  Transportation Needs:   . Lack of Transportation (Medical): Not on file  . Lack of Transportation (Non-Medical): Not on file  Physical Activity:   . Days of Exercise per Week: Not on file  . Minutes of Exercise per Session: Not on file  Stress:   . Feeling of Stress : Not on file  Social Connections:   . Frequency of Communication with Friends and Family: Not on file  . Frequency of Social Gatherings with Friends and Family: Not on file  . Attends Religious Services: Not on file  . Active Member of Clubs or Organizations: Not on file  . Attends Archivist Meetings: Not on file  . Marital Status: Not on file  Intimate Partner Violence:   . Fear of  Current or Ex-Partner: Not on file  . Emotionally Abused: Not on file  . Physically Abused: Not on file  . Sexually Abused: Not on file    Past Surgical History:  Procedure Laterality Date  . ABDOMINAL HYSTERECTOMY  2001  . Cataract Surgery  Bilateral   . COLONOSCOPY W/ BIOPSIES  2006   2 polyps noted along with diverticulitis  . CORONARY ANGIOPLASTY WITH STENT PLACEMENT  11/2014   [facility]: 90% RCA - PCI with Promus DES.  [name] implanted cardiac monitor     . LOOP RECORDER INSERTION  07/24/2013   Medtronic Linq - to evaluate SSS for symptomatic bradycardia  . Right and Left Heart Catheterization  11/2014   RHC: RAP 4, RVP 32/4, PA peak 29/7/16. LVEDP 18.  CORS: 30% LM, 50% LAD, 90% RCA --> PCI (Promus DES)  . TEE WITHOUT CARDIOVERSION  09/2014   Normal LV function. Severe MR with multiple jets. Vena contractile 0.8.  PFO with L-R shunt; moderate grade 3-4 aortic atheroma  . TONSILLECTOMY    . TRANSTHORACIC ECHOCARDIOGRAM  07/2017   EF 60 to 65%.  Moderate-severe MR.  Moderate TR with elevated RVSP  . TRANSTHORACIC ECHOCARDIOGRAM  08/2014   Normal LV function with biatrial enlargement. Moderate-severe mitral and tricuspid insufficiency.    Family History  Problem Relation Age of Onset  . Stomach cancer Father   . Cancer Brother        type unknown  . Diabetes Brother   . Cancer Daughter        Small cell sarcoma    Allergies  Allergen Reactions  . Carvedilol Other (See Comments)    Bradycardia   . Diltiazem Other (See Comments)    Bradycardia  . Lopressor [Metoprolol] Other (See Comments)    Bradycardia    Current Outpatient Medications on File Prior to Visit  Medication Sig Dispense Refill  . acetaminophen (TYLENOL) 325 MG tablet Take 325 mg by mouth at bedtime. daily     . amiodarone (PACERONE) 100 MG tablet Take 1 tablet (100 mg total) by mouth daily.    [name] aspirin EC 81 MG tablet Take 81 mg by mouth daily.    .  Carboxymethylcellul-Glycerin (REFRESH OPTIVE OP) Place 1 drop into both eyes daily as needed (dry eyes).    . ferrous sulfate 325 (65 FE) MG tablet Take 325 mg by mouth 2 (two) times daily with a meal.     .  fluticasone (FLONASE) 50 MCG/ACT nasal spray Place 2 sprays into both nostrils daily as needed for allergies or rhinitis.     [name] levothyroxine (SYNTHROID) 75 MCG tablet TAKE 1 TABLET BY MOUTH ONCE A DAY BEFORE BREAKFAST (Patient taking differently: Take 75 mcg by mouth daily before breakfast. TAKE 1 TABLET BY MOUTH ONCE A DAY BEFORE BREAKFAST) 90 tablet 0  . omeprazole (PRILOSEC) 20 MG capsule TAKE 1 CAPSULE (20 MG TOTAL) BY MOUTH DAILY. 90 capsule 0  . ondansetron (ZOFRAN) 4 MG tablet Take 1 tablet (4 mg total) by mouth every 6 (six) hours as needed for nausea. 20 tablet 0  . polyethylene glycol (MIRALAX / GLYCOLAX) packet Take 8.5 g by mouth daily as needed for moderate constipation. Mix 1/2 scoop (8.5 g) in 8 oz orange juice and drink daily     No current facility-administered medications on file prior to visit.    LMP  (LMP Unknown)        Objective:   Physical Exam Vitals and nursing note reviewed.  Constitutional:      Appearance: She is underweight.     Comments: Frail appearing    Cardiovascular:     Rate and Rhythm: Normal rate and regular rhythm.     Pulses: Normal pulses.     Heart sounds: Normal heart sounds.  Pulmonary:     Effort: Pulmonary effort is normal.     Breath sounds: Normal breath sounds.  Abdominal:     General: Abdomen is flat.     Palpations: Abdomen is soft.  Musculoskeletal:        General: Normal range of motion.  Skin:    General: Skin is warm and dry.     Capillary Refill: Capillary refill takes less than 2 seconds.  Neurological:     General: No focal deficit present.     Mental Status: She is alert and oriented to person, place, and time.     Motor: Weakness present.     Gait: Gait abnormal (walks with rolling walker. ).  Psychiatric:         Mood and Affect: Mood normal.        Behavior: Behavior normal.        Thought Content: Thought content normal.        Judgment: Judgment normal.       Assessment & Plan:  1. Colitis -She will follow-up with gastroenterology in 2 weeks.    2. Syncope, unspecified syncope type -Continue with fall precautions.  She has a appointment with cardiology this week   3. Dizziness - meclizine (ANTIVERT) 25 MG tablet; Take 0.5-1 tablets (12.5-25 mg total) by mouth 3 (three) times daily as needed for up to 30 doses for dizziness.  Dispense: 90 tablet; Refill: 1  4. Anxiety  - ALPRAZolam (XANAX) 0.25 MG tablet; Take 1 tablet (0.25 mg total) by mouth at bedtime as needed for anxiety.  Dispense: 30 tablet; Refill: 1  5. Failure to thrive in adult -Had a discussion with the patient and her husband about referral to palliative care.  Did you think this is a good idea at this point in time and they agree.  - Amb Referral to Palliative Care   Shirline Frees, NP

## 2019-10-29 ENCOUNTER — Encounter: Payer: Self-pay | Admitting: Cardiology

## 2019-10-29 ENCOUNTER — Ambulatory Visit (INDEPENDENT_AMBULATORY_CARE_PROVIDER_SITE_OTHER): Payer: Medicare HMO | Admitting: Cardiology

## 2019-10-29 VITALS — BP 172/56 | HR 84 | Ht 59.0 in | Wt 114.0 lb

## 2019-10-29 DIAGNOSIS — R627 Adult failure to thrive: Secondary | ICD-10-CM

## 2019-10-29 DIAGNOSIS — I480 Paroxysmal atrial fibrillation: Secondary | ICD-10-CM | POA: Diagnosis not present

## 2019-10-29 DIAGNOSIS — I38 Endocarditis, valve unspecified: Secondary | ICD-10-CM | POA: Diagnosis not present

## 2019-10-29 DIAGNOSIS — I5032 Chronic diastolic (congestive) heart failure: Secondary | ICD-10-CM

## 2019-10-29 DIAGNOSIS — I2510 Atherosclerotic heart disease of native coronary artery without angina pectoris: Secondary | ICD-10-CM

## 2019-10-29 DIAGNOSIS — R55 Syncope and collapse: Secondary | ICD-10-CM

## 2019-10-29 DIAGNOSIS — I080 Rheumatic disorders of both mitral and aortic valves: Secondary | ICD-10-CM

## 2019-10-29 MED ORDER — AMIODARONE HCL 100 MG PO TABS: 100.0000 mg | ORAL_TABLET | ORAL | Status: AC

## 2019-10-29 NOTE — Patient Instructions (Addendum)
Medication Instructions:  Medication changes Decrease Amiodarone to 100 mg  ( 1/2 tablet of 200 mg )  Every other day .  if heart rate is irregular or fast -- take 200 mg of Amiodarone one tablet for 2 days  , then decrease to 100 mg  Daily for 5 days then go back to 100 mg every other day.  *If you need a refill on your cardiac medications before your next appointment, please call your pharmacy*  Lab Work: Not needed Testing/Procedures: Not needed  Follow-Up: At [facility], you and your health needs are our priority.  As part of our continuing mission to provide you with exceptional heart care, we have created designated Provider Care Teams.  These Care Teams include your primary Cardiologist (physician) and Advanced Practice Providers (APPs -  Physician Assistants and Nurse Practitioners) who all work together to provide you with the care you need, when you need it.  Your next appointment:   2 month(s)  The format for your next appointment:   Virtual Visit   Provider:   Bryan Lemma, MD  Other Instructions  Try keep your calories up- eat or drink ensure or boost

## 2019-10-29 NOTE — Progress Notes (Signed)
Primary Care Provider: Shirline Frees, NP Cardiologist: Bryan Lemma, MD Electrophysiologist: None  Clinic Note: Chief Complaint  Patient presents with  . Hospitalization Follow-up    Recent admission for colitis and syncope  . Loss of Consciousness  . Atrial Fibrillation  . Failure To Thrive   HPI:    Deborah [name] is a 86 y.o. female with a complicated PMH notable for CAD-PCI, borderline severe MR, HFpEF, PAF with recent breakthrough/tachybradycardia), and recurrent likely vasovagal syncope who presents today for hospital follow-up again with episode of syncope during what appeared to be an episode of colitis.  Deborah [name] was last seen on October 10, 2019 in follow-up from her hospitalization January 15 the 19th for weakness and fatigue with near syncope noted to be in A. fib RVR.  She was actually started on amiodarone. -> She noted that she was feeling better since hospitalization, but still noted significant fatigue.  Plan was to reduce her amiodarone dose to 100 mg daily after 2 weeks 200 mg daily. -> Over the next week or so she had worsening nausea dizziness and lightheadedness.  We had tried to adjust dosing the time of her amiodarone from night today with no improvement.  My recommendation was to reduce her to 100 mg daily and then even every other day amiodarone for fear of medicine reaction.  Recent Hospitalizations:   Admitted January 30-October 20, 2019 -> presented with 2 episodes of syncope abdominal pain and diarrhea.  Had recently been treated for UTI with cefdinir from her last hospitalization.  CT scan suggested possible colitis so she was started on Flagyl.  Her syncopal episodes were associated with bowel movements.  Palliative care consult was obtained -> confirmed DNR.  Agreed to minimize medications.  Discharged home with home health  Reviewed  CV studies:    The following studies were reviewed today: (if available, images/films reviewed: From Epic Chart or  Care Everywhere) . None:   Interval History:   Deborah [name] is here again with her husband Merton Border) indicating that she has not had any further passout spells, she is stabilized a little bit.  She is not eating and drinking very well but is supplementing her diet some with boost shakes.  She is not really having any PND orthopnea symptoms no edema.  No chest pain or pressure.  Does not appear that there is been any recurrence of A. fib, however her husband's not sure. She still has nausea and and weakness and fatigue, but no further syncopal episodes..  The patient does not have symptoms concerning for COVID-19 infection (fever, chills, cough, or new shortness of breath).  The patient is practicing social distancing & Masking.  She and her husband have had their COVID-19 vaccines.   REVIEWED OF SYSTEMS   A comprehensive ROS was performed. Review of Systems  Constitutional: Positive for malaise/fatigue. Negative for weight loss (But not able to put on any weight.).  HENT: Negative for nosebleeds.   Respiratory: Negative for shortness of breath.   Gastrointestinal: Positive for abdominal pain, diarrhea (Loose stools, not diarrhea) and nausea. Negative for blood in stool and vomiting (Not so much vomiting).       Decreased p.o. intake.  Genitourinary: Negative for hematuria.  Musculoskeletal: Positive for joint pain.  Neurological: Positive for weakness (Generalized). Negative for focal weakness and loss of consciousness (None since hospitalization).  Psychiatric/Behavioral: Positive for memory loss. The patient is not nervous/anxious.    I have reviewed and (if needed) personally updated the patient's  problem list, medications, allergies, past medical and surgical history, social and family history.   PAST MEDICAL HISTORY   Past Medical History:  Diagnosis Date  . Anxiety   . Bradycardia   . CAD S/P percutaneous coronary angioplasty 11/2014   DES PCI to RCA. Cath was done for  pre-operative evaluation for mitral repair; LM 30%, LAD 50%, RCA 90% (Promus DES)  . 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 Normal EF by echo.  . Diverticulitis   . Essential hypertension   . H/O: upper GI bleed 12/03/2014   While on ASA/Plavix & Warfarin --> Hct dropped to 18%; small AVM noted but no overt pathology.  [name]. History of uterine cancer 2001   Status post hysterectomy  . Hyperlipidemia with target LDL less than 70    Coronary disease and thoracic aortic atheroma  . Hypothyroidism    On Levothyroxine  . Paroxysmal atrial fibrillation (HCC) 2014   Associated with sick sinus syndrome. -- Intolerant of beta blockers and diltiazem. Rate control with digoxin. Not on anticoagulation despite CHA2DS2Vasc 6  2/2 prior severe GI bleed on triple therapy  . PFO (patent foramen ovale)    Noted on TEE - L-R shunt (elsewhere reported it as a ASD)  . Severe mitral regurgitation by prior echocardiogram 09/2014   Seen on TEE to have severe MR with multiple jets. Vena contracted 0.8; Consideration had been ? Mitral E-Clip - put on hold after GI Bleed.  . Sick sinus syndrome (HCC)    Status post Loop Recorder: Medtr[employer] Linq -- most recent interrogation 05/19/2016: 2 new pauses. One greater than 3 seconds at 0 320 the morning. Second was 4.4 seconds at 9:56 PM; also noted to have an episode of A. fib. --> [facility]  . Thoracic aortic atherosclerosis (HCC) 09/2014   Moderate grade 3-4 atheroma noted on TEE  . Urinary tract infection   . Vasovagal syncope    Exacerbated by bradycardia, sick sinus syndrome, and mitral regurgitation.  . Vertigo, central, unspecified laterality    Chronic dizziness.    PAST SURGICAL HISTORY   Past Surgical History:  Procedure Laterality Date  . ABDOMINAL HYSTERECTOMY  2001  . Cataract Surgery  Bilateral   . COLONOSCOPY W/ BIOPSIES  2006   2  polyps noted along with diverticulitis  . CORONARY ANGIOPLASTY WITH STENT PLACEMENT  11/2014   [facility]: 90% RCA - PCI with Promus DES.  [name]. implanted cardiac monitor     . LOOP RECORDER INSERTION  07/24/2013   Medtronic Linq - to evaluate SSS for symptomatic bradycardia  . Right and Left Heart Catheterization  11/2014   RHC: RAP 4, RVP 32/4, PA peak 29/7/16. LVEDP 18.  C[facility]: 30% LM, 50% LAD, 90% RCA --> PCI (Promus DES)  . TEE WITHOUT CARDIOVERSION  09/2014   Normal LV function. Severe MR with multiple jets. Vena contractile 0.8.  PFO with L-R shunt; moderate grade 3-4 aortic atheroma  . TONSILLECTOMY    . TRANSTHORACIC ECHOCARDIOGRAM  07/2017   EF 60 to 65%.  Moderate-severe MR.  Moderate TR with elevated RVSP  . TRANSTHORACIC ECHOCARDIOGRAM  08/2014   Normal LV function with biatrial enlargement. Moderate-severe mitral and tricuspid insufficiency.    MEDICATIONS/ALLERGIES   Current Meds  Medication Sig  . acetaminophen (TYLENOL) 325 MG tablet Take 325 mg by mouth at bedtime. daily   . ALPRAZolam Prudy Feeler(XANAX)  0.25 MG tablet Take 1 tablet (0.25 mg total) by mouth at bedtime as needed for anxiety.  [name] amiodarone (PACERONE) 100 MG tablet Take 1 tablet (100 mg total) by mouth every other day. And as directed.  [name] aspirin EC 81 MG tablet Take 81 mg by mouth daily.  . Carboxymethylcellul-Glycerin (REFRESH OPTIVE OP) Place 1 drop into both eyes daily as needed (dry eyes).  . ferrous sulfate 325 (65 FE) MG tablet Take 325 mg by mouth 2 (two) times daily with a meal.   . fluticasone (FLONASE) 50 MCG/ACT nasal spray Place 2 sprays into both nostrils daily as needed for allergies or rhinitis.   [name] levothyroxine (SYNTHROID) 75 MCG tablet TAKE 1 TABLET BY MOUTH ONCE A DAY BEFORE BREAKFAST (Patient taking differently: Take 75 mcg by mouth daily before breakfast. TAKE 1 TABLET BY MOUTH ONCE A DAY BEFORE BREAKFAST)  . meclizine (ANTIVERT) 25 MG tablet Take 0.5-1 tablets  (12.5-25 mg total) by mouth 3 (three) times daily as needed for up to 30 doses for dizziness.  [name] omeprazole (PRILOSEC) 20 MG capsule TAKE 1 CAPSULE (20 MG TOTAL) BY MOUTH DAILY.  [name] ondansetron (ZOFRAN) 4 MG tablet Take 1 tablet (4 mg total) by mouth every 6 (six) hours as needed for nausea.  . polyethylene glycol (MIRALAX / GLYCOLAX) packet Take 8.5 g by mouth daily as needed for moderate constipation. Mix 1/2 scoop (8.5 g) in 8 oz orange juice and drink daily  . [DISCONTINUED] amiodarone (PACERONE) 100 MG tablet Take 1 tablet (100 mg total) by mouth daily.    Allergies  Allergen Reactions  . Carvedilol Other (See Comments)    Bradycardia   . Diltiazem Other (See Comments)    Bradycardia  . Lopressor [Metoprolol] Other (See Comments)    Bradycardia    SOCIAL HISTORY/FAMILY HISTORY   Social History   Tobacco Use  . Smoking status: Former Smoker    Packs/day: 0.50    Years: 5.00    Pack years: 2.50    Types: Cigarettes    Start date: 1966    Quit date: 08/02/1970    Years since quitting: 49.2  . Smokeless tobacco: Never Used  Substance Use Topics  . Alcohol use: Yes    Comment: "wine occasionally"  . Drug use: No   Social History   Social History Narrative   Married for 59 years    Has a son, daughter died of breast cancer   One grandson    Moved from [place] -( Lives there from May- October) to be closer to their son & only remaining family.    Family History family history includes Cancer in her brother and daughter; Diabetes in her brother; Stomach cancer in her father.  OBJCTIVE -PE, EKG, labs   Wt Readings from Last 3 Encounters:  10/29/19 114 lb (51.7 kg)  10/23/19 116 lb 8 oz (52.8 kg)  10/18/19 115 lb 3.2 oz (52.3 kg)    Physical Exam: BP (!) 172/56   Pulse 84   Ht 4\' 11"  (1.499 m)   Wt 114 lb (51.7 kg)   LMP  (LMP Unknown)   BMI 23.03 kg/m  Physical Exam  Constitutional: No distress.  Thin, frail elderly woman.  Still remains confused.   Well-groomed.  Notably debilitated, weak with slow unsteady gait.  HENT:  Head: Normocephalic and atraumatic.  Cardiovascular: Normal rate and regular rhythm. Exam reveals decreased pulses (Decreased pedal pulses.).  Murmur (1/6 SEM.  Also 2/6 blowing HSM at apex.) heard.  Pulmonary/Chest: Effort normal and breath sounds normal. No respiratory distress. She has no wheezes. She has no rales.  Musculoskeletal:        General: No edema. Normal range of motion.  Neurological: She is alert.  Oriented to person and place  Psychiatric:  Defers answering of questions to her husband.  She does not necessarily seem to be following our discussion, seems to be more active and leaving.  Vitals reviewed.    Adult ECG Report  Rate: 84 ;  Rhythm: normal sinus rhythm and First-degree AV block.  Otherwise normal axis, normal axis, intervals and durations.;   Narrative Interpretation: Sinus rhythm replaced A. fib  Recent Labs:    Lab Results  Component Value Date   CHOL 169 07/19/2017   HDL 54.80 07/19/2017   LDLCALC 83 07/19/2017   TRIG 152.0 (H) 07/19/2017   CHOLHDL 3 07/19/2017   Lab Results  Component Value Date   CREATININE 0.71 10/23/2019   BUN 7 10/23/2019   NA 132 (L) 10/23/2019   K 3.7 10/23/2019   CL 99 10/23/2019   CO2 27 10/23/2019   Lab Results  Component Value Date   TSH 3.028 10/04/2019    ASSESSMENT/PLAN    Problem List Items Addressed This Visit    Coronary artery disease involving native heart without angina pectoris (Chronic)    No angina.  Stable from that standpoint.  Is now on aspirin alone.  She is on amiodarone which provide some beta-blocker effect.  Based on our previous discussion, we return to minimize medications.  She has felt much better being off of all medications and therefore given her advanced age and our previous goals of care discussions, we will not restart any medication such as statin or beta-blocker.  Would allow for permissive hypertension.        Relevant Medications   amiodarone (PACERONE) 100 MG tablet   Other Relevant Orders   EKG 12-Lead (Completed)   Mitral regurgitation and aortic stenosis (Chronic)    No plans for further management.  She probably would become symptomatic when and if she is in A. fib.  I do not think that would be enough to make her have a syncopal episode however.  I do find it reasonable to try to treat to prevent A. fib.  However I think we need to be very judicious with dosing of amiodarone.  Not actively having any heart failure symptoms.  No need for diuretic at this time. In the interest of avoiding any further syncopal episodes, we have not added any blood pressure medications.    Allow for permissive hypertension.      Relevant Medications   amiodarone (PACERONE) 100 MG tablet   Paroxysmal atrial fibrillation ([place]): CHA2DS2Vasc = 6; Not currently on AC. (Chronic)    Clearly is symptomatic in A. fib, however need to be really careful with amiodarone. No plans for anticoagulation beyond aspirin.  Plan: We will reduce her amiodarone to 100 mg every other day.  Where she did have a breakthrough episode we have a plan for titrating the doses to try to break A. Fib.   if heart rate is irregular or fast -- take 200 mg of Amiodarone one tablet for 2 days  , then decrease to 100 mg  Daily for 5 days then go back to 100 mg every other day      Relevant Medications   amiodarone (PACERONE) 100 MG tablet   Other Relevant Orders   EKG 12-Lead (Completed)  Failure to thrive in adult - Primary (Chronic)    Perhaps the most notable part of his visit was acknowledging that Tatiana is clearly starting to show signs of failure to thrive.  I cannot exclude a component of amiodarone playing a role in this, but I suspect that it is mostly just worsening nutrition and general decline in health.  Thankfully, she is DNR/DNI.  And return to minimize any aggressive management.  Encouraged to increase p.o. hydration  and nutrition.      Chronic diastolic heart failure due to valvular disease (HCC) (Chronic)    No heart failure symptoms.  Euvolemic on exam.  No plans to add additional medications.  I do think that support stockings are beneficial.  Not really requiring any diuretics and she is not really eating and drinking much.      Relevant Medications   amiodarone (PACERONE) 100 MG tablet   Other Relevant Orders   EKG 12-Lead (Completed)   Vasovagal syncope (Chronic)    Quite clearly her most recent episodes of syncope were related to bowel movements and colitis.  She is very susceptible to this.  Very important to increase her p.o. intake and hydration.  Hopefully we can maintain sinus rhythm with amiodarone as A. fib may exacerbate issues.  I think what we are starting to see now with her decreased p.o. intake and lack of appetite is that she is starting down the pathway of failure to thrive.  I did bring this topic up to the husband discussing the need to consider that these episodes are an indication of her declining health.      Relevant Medications   amiodarone (PACERONE) 100 MG tablet      COVID-19 Education: The signs and symptoms of COVID-19 were discussed with the patient and how to seek care for testing (follow up with PCP or arrange E-visit).   The importance of social distancing was discussed today.  I spent a total of 20 minutes with the patient. >  50% of the time was spent in direct patient consultation.  Additional time spent with chart review  / charting (studies, outside notes, etc): 6 Total Time: 26 min   Current medicines are reviewed at length with the patient today.  (+/- concerns) none   Patient Instructions / Medication Changes & Studies & Tests Ordered   Patient Instructions  Medication Instructions:  Medication changes Decrease Amiodarone to 100 mg  ( 1/2 tablet of 200 mg )  Every other day .  if heart rate is irregular or fast -- take 200 mg of Amiodarone  one tablet for 2 days  , then decrease to 100 mg  Daily for 5 days then go back to 100 mg every other day.  *If you need a refill on your cardiac medications before your next appointment, please call your pharmacy*  Lab Work: Not needed Testing/Procedures: Not needed  Follow-Up: At [facility], you and your health needs are our priority.  As part of our continuing mission to provide you with exceptional heart care, we have created designated Provider Care Teams.  These Care Teams include your primary Cardiologist (physician) and Advanced Practice Providers (APPs -  Physician Assistants and Nurse Practitioners) who all work together to provide you with the care you need, when you need it.  Your next appointment:   2 month(s)  The format for your next appointment:   Virtual Visit   Provider:   Bryan Lemma, MD  Other Instructions  Try keep  your calories up- eat or drink ensure or boost   Studies Ordered:   Orders Placed This Encounter  Procedures  . EKG 12-Lead     Bryan Lemma, M.D., M.S. Interventional Cardiologist   Pager # [phone] Phone # [phone] [address] [place], [place] [address]   Thank you for choosing [facility] at [facility]!!

## 2019-11-01 ENCOUNTER — Encounter: Payer: Self-pay | Admitting: Cardiology

## 2019-11-01 DIAGNOSIS — R627 Adult failure to thrive: Secondary | ICD-10-CM | POA: Insufficient documentation

## 2019-11-01 NOTE — Assessment & Plan Note (Signed)
Perhaps the most notable part of his visit was acknowledging that Savanna is clearly starting to show signs of failure to thrive.  I cannot exclude a component of amiodarone playing a role in this, but I suspect that it is mostly just worsening nutrition and general decline in health.  Thankfully, she is DNR/DNI.  And return to minimize any aggressive management.  Encouraged to increase p.o. hydration and nutrition.

## 2019-11-01 NOTE — Assessment & Plan Note (Addendum)
Clearly is symptomatic in A. fib, however need to be really careful with amiodarone. No plans for anticoagulation beyond aspirin.  Plan: We will reduce her amiodarone to 100 mg every other day.  Where she did have a breakthrough episode we have a plan for titrating the doses to try to break A. Fib.   if heart rate is irregular or fast -- take 200 mg of Amiodarone one tablet for 2 days  , then decrease to 100 mg  Daily for 5 days then go back to 100 mg every other day

## 2019-11-01 NOTE — Assessment & Plan Note (Addendum)
No plans for further management.  She probably would become symptomatic when and if she is in A. fib.  I do not think that would be enough to make her have a syncopal episode however.  I do find it reasonable to try to treat to prevent A. fib.  However I think we need to be very judicious with dosing of amiodarone.  Not actively having any heart failure symptoms.  No need for diuretic at this time. In the interest of avoiding any further syncopal episodes, we have not added any blood pressure medications.    Allow for permissive hypertension.

## 2019-11-01 NOTE — Assessment & Plan Note (Signed)
No heart failure symptoms.  Euvolemic on exam.  No plans to add additional medications.  I do think that support stockings are beneficial.  Not really requiring any diuretics and she is not really eating and drinking much.

## 2019-11-01 NOTE — Assessment & Plan Note (Signed)
No angina.  Stable from that standpoint.  Is now on aspirin alone.  She is on amiodarone which provide some beta-blocker effect.  Based on our previous discussion, we return to minimize medications.  She has felt much better being off of all medications and therefore given her advanced age and our previous goals of care discussions, we will not restart any medication such as statin or beta-blocker.  Would allow for permissive hypertension.

## 2019-11-01 NOTE — Assessment & Plan Note (Signed)
Quite clearly her most recent episodes of syncope were related to bowel movements and colitis.  She is very susceptible to this.  Very important to increase her p.o. intake and hydration.  Hopefully we can maintain sinus rhythm with amiodarone as A. fib may exacerbate issues.  I think what we are starting to see now with her decreased p.o. intake and lack of appetite is that she is starting down the pathway of failure to thrive.  I did bring this topic up to the husband discussing the need to consider that these episodes are an indication of her declining health.

## 2019-11-10 ENCOUNTER — Telehealth: Payer: Self-pay | Admitting: Cardiology

## 2019-11-10 NOTE — Telephone Encounter (Signed)
Attempt to return call x 2-"cannot be completed as dialed".    Mychart messages have been routed to MD for review.

## 2019-11-10 NOTE — Telephone Encounter (Signed)
Patient's husband calling in reference to his mychart message. Please advise.

## 2019-11-11 NOTE — Telephone Encounter (Signed)
After trying to contact patient via phone, mychart message was sent back to patient with Dr.Harding's response.  Will remove from triage pool.

## 2019-11-14 ENCOUNTER — Encounter: Payer: Self-pay | Admitting: Adult Health

## 2019-11-16 ENCOUNTER — Encounter: Payer: Self-pay | Admitting: Adult Health

## 2019-11-18 ENCOUNTER — Other Ambulatory Visit: Payer: Self-pay | Admitting: Adult Health

## 2019-11-18 ENCOUNTER — Ambulatory Visit: Payer: Medicare HMO | Attending: Internal Medicine

## 2019-11-18 DIAGNOSIS — Z23 Encounter for immunization: Secondary | ICD-10-CM

## 2019-11-18 DIAGNOSIS — F419 Anxiety disorder, unspecified: Secondary | ICD-10-CM

## 2019-11-18 MED ORDER — ALPRAZOLAM 0.25 MG PO TABS: 0.2500 mg | ORAL_TABLET | Freq: Two times a day (BID) | ORAL | 1 refills | Status: AC | PRN

## 2019-11-18 NOTE — Progress Notes (Signed)
   Covid-19 Vaccination Clinic  Name:  Deborah [name]    MRN: [id number] DOB: Mar 24, 1931  11/18/2019  [name] was observed post Covid-19 immunization for 15 minutes without incident. She was provided with Vaccine Information Sheet and instruction to access the V-Safe system.   [name] was instructed to call 911 with any severe reactions post vaccine: [name] Difficulty breathing  . Swelling of face and throat  . A fast heartbeat  . A bad rash all over body  . Dizziness and weakness   Immunizations Administered    Name Date Dose VIS Date Route   Pfizer COVID-19 Vaccine 11/18/2019  1:44 PM 0.3 mL 08/29/2019 Intramuscular   Manufacturer: [employer], [employer]   Lot: [id number]   NDC: [address]-1

## 2019-11-20 ENCOUNTER — Encounter: Payer: Self-pay | Admitting: Adult Health

## 2019-11-21 ENCOUNTER — Telehealth: Payer: Self-pay | Admitting: Internal Medicine

## 2019-11-21 ENCOUNTER — Encounter: Payer: Self-pay | Admitting: Adult Health

## 2019-11-21 NOTE — Telephone Encounter (Signed)
[facility] Palliative visit scheduled for 11-28-19 at 1:30.

## 2019-11-24 ENCOUNTER — Other Ambulatory Visit: Payer: Self-pay

## 2019-11-24 ENCOUNTER — Other Ambulatory Visit: Payer: Medicare HMO | Admitting: Internal Medicine

## 2019-11-24 ENCOUNTER — Encounter: Payer: Self-pay | Admitting: Adult Health

## 2019-11-24 DIAGNOSIS — Z7189 Other specified counseling: Secondary | ICD-10-CM

## 2019-11-24 DIAGNOSIS — Z515 Encounter for palliative care: Secondary | ICD-10-CM

## 2019-11-24 NOTE — Progress Notes (Signed)
March 8th, 2021 [facility] Palliative Care Consult Note Telephone: [phone]  Fax: [phone]  PATIENT NAME: Deborah [name] DOB: 09-26-1930 MRN: [id number] [phone] [address] ([address] [place], [place]  [address]  PRIMARY CARE PROVIDER:   Shirline Frees, NP Dr. Bryan [name] (Cardiology)  REFERRING PROVIDER:  Shirline Frees, NP [address] [place], [place] [address]  RESPONSIBLE PARTY: Deborah [name] (son) [phone] (M). Deborah [name] (spouse) [phone] (M), [phone] (H).  ASSESSMENT / RECOMMENDATIONS:  1. Advance Care Planning: A. Directives: Patient's spouse Deborah [name] request no cardiopulmonary resuscitation for patient, in the event of an arrest. I completed 2 copies of the Do Not Resuscitate from, uploaded into [facility], and left the originals in the home. She has a MOST form in [facility]: DNR/DNI. Limited scope of medical interventions. Yes to antibiotics, IVFs determine use at time of need. No to tube feeding. I completed copy and left with family. B. Goals of Care: Comfort; wish to avoid hospitalizations. Family considering Hospice referral. We discussed hospice services in some detail. Patient's son and spouse wished to have opinion of patient's cardiologist and PCP. Each replied (via my chart) they felt hospice referral was appropriate. I'll continue working with family for their final decision.  2. Cognitive / Functional status: Progressive signs of dementia over the last 5 years. Patient is constantly confused. Oriented to self only. She periodically but constantly calls out in an anxious manner when she is intermittently awake. She has progressive somnolence and is asleep more than 22 hrs/day. She has been trialed on Xanax 0.25mg  bid for agitation without consistent improvement in anxiety/upset state. Her speech is rambling, in short phrases. She appears at high risk for aspiration; constantly clearing her throat when supine,  improved a little when placed side lying. She now only rarely can sit up by herself and relies on husband to transfer.  Three weeks ago was independent to ambulate with cane. Now can only ambulate with walker with spouse pulling her forward and constantly cuing her. She is dependent for all ADLs. She is incontinent unless spouse toilets her on a regular schedule. She has been constipated for the last 1.5 weeks. Spouse has been administering MiraLax half dose over the last few days. Decreased oral intake; drinks ensure tid and occasional egg or 2 for breakfast. Needs cuing to eat. Her current weight is 108 lbs (down about 20 lbs from baseline). Base line height was 5'4" (currently 4'11"), for a BMI of 18.5kg/m2. She has some skin breakdown on her smallest R two toes.  -start Senna, titrate 1-2 tabs qd to bid to response.   -consider initiation of Haldol for agitation.  3. Family Supports: Patient and spouse Deborah [name] move from [place] to [place] (2017), to be closer to their son Deborah [name] (lives up the street). Patient's daughter passed at age 28 of sarcoma. Deborah [name] is asking about referrals for outside help, such as sitter/assist with patient bathing. We resourced [address] and Deborah [name] was able to obtain some leads. We discussed tapping into outside agencies; Deborah [name] has done so and was dismayed regarding the fees.   4. Follow up Palliative Care Visit: Pnd decision regarding hospice referral. If no referral, I'll continue working closely with family to support (community resources, education, symptom management agitation, constipation).  I spent 60 minutes providing this consultation from 11am to noon. More than 50% of the time in this consultation was spent coordinating communication.   HISTORY OF PRESENT ILLNESS:  Deborah [name]  is a 86 y.o. female with h/o anxiety, CAD (PCI), borderline severe mitral valve regurg, diastolic CHF, PAF/tach-bradycardia (family do not want pacemaker or anticoagulation), vasovagal  syncope, HTN, UGI bleed, hypothyroidism, PFO, sick sinus syndrome.   -1/30-10/20/2019 Hospitalized. Syncope (associated with BM/prob vasovagal), diarrhea form colitis ([place]). -1/15-1/19/2021 Hospitalized afib with tachy/brady. Started amiodarone. Treated for UTI.  Palliative Care was asked to help address goals of care.   CODE STATUS: DNR  PPS: 30% HOSPICE ELIGIBILITY/DIAGNOSIS: TBD  PAST MEDICAL HISTORY:  Past Medical History:  Diagnosis Date  . Anxiety   . Bradycardia   . CAD S/P percutaneous coronary angioplasty 11/2014   DES PCI to RCA. Cath was done for pre-operative evaluation for mitral repair; LM 30%, LAD 50%, RCA 90% (Promus DES)  . Chronic diastolic heart failure due to valvular disease ([place]) 2015   Related to severe mitral regurgitation. Normal EF by echo.  . Diverticulitis   . Essential hypertension   . H/O: upper GI bleed 12/03/2014   While on ASA/Plavix & Warfarin --> Hct dropped to 18%; small AVM noted but no overt pathology.  [name] History of uterine cancer 2001   Status post hysterectomy  . Hyperlipidemia with target LDL less than 70    Coronary disease and thoracic aortic atheroma  . Hypothyroidism    On Levothyroxine  . Paroxysmal atrial fibrillation ([place]) 2014   Associated with sick sinus syndrome. -- Intolerant of beta blockers and diltiazem. Rate control with digoxin. Not on anticoagulation despite CHA2DS2Vasc 6  2/2 prior severe GI bleed on triple therapy  . PFO (patent foramen ovale)    Noted on TEE - L-R shunt (elsewhere reported it as a ASD)  . Severe mitral regurgitation by prior echocardiogram 09/2014   Seen on TEE to have severe MR with multiple jets. Vena contracted 0.8; Consideration had been ? Mitral E-Clip - put on hold after GI Bleed.  . Sick sinus syndrome ([place])    Status post Loop Recorder: [employer] Linq -- most recent interrogation 05/19/2016: 2 new pauses. One greater than 3 seconds at 0 320 the morning. Second was 4.4 seconds at 9:56 PM; also  noted to have an episode of A. fib. --> Recommended further follow-up upon arrival to [facility].  . Thoracic aortic atherosclerosis ([place]) 09/2014   Moderate grade 3-4 atheroma noted on TEE  . Urinary tract infection   . Vasovagal syncope    Exacerbated by bradycardia, sick sinus syndrome, and mitral regurgitation.  . Vertigo, central, unspecified laterality    Chronic dizziness.    SOCIAL HX:  Social History   Tobacco Use  . Smoking status: Former Smoker    Packs/day: 0.50    Years: 5.00    Pack years: 2.50    Types: Cigarettes    Start date: 1966    Quit date: 08/02/1970    Years since quitting: 49.3  . Smokeless tobacco: Never Used  Substance Use Topics  . Alcohol use: Yes    Comment: "wine occasionally"    ALLERGIES:  Allergies  Allergen Reactions  . Carvedilol Other (See Comments)    Bradycardia   . Diltiazem Other (See Comments)    Bradycardia  . Lopressor [Metoprolol] Other (See Comments)    Bradycardia     PERTINENT MEDICATIONS:  Outpatient Encounter Medications as of 11/24/2019  Medication Sig  . acetaminophen (TYLENOL) 325 MG tablet Take 325 mg by mouth at bedtime. daily   . ALPRAZolam (XANAX) 0.25 MG tablet Take 1 tablet (0.25 mg total) by mouth  2 (two) times daily as needed for anxiety.  [name] amiodarone (PACERONE) 100 MG tablet Take 1 tablet (100 mg total) by mouth every other day. And as directed.  [name] aspirin EC 81 MG tablet Take 81 mg by mouth daily.  . Carboxymethylcellul-Glycerin (REFRESH OPTIVE OP) Place 1 drop into both eyes daily as needed (dry eyes).  . ferrous sulfate 325 (65 FE) MG tablet Take 325 mg by mouth 2 (two) times daily with a meal.   . fluticasone (FLONASE) 50 MCG/ACT nasal spray Place 2 sprays into both nostrils daily as needed for allergies or rhinitis.   [name] levothyroxine (SYNTHROID) 75 MCG tablet TAKE 1 TABLET BY MOUTH ONCE A DAY BEFORE BREAKFAST (Patient taking differently: Take 75 mcg by mouth daily before breakfast. TAKE 1 TABLET BY  MOUTH ONCE A DAY BEFORE BREAKFAST)  . meclizine (ANTIVERT) 25 MG tablet Take 0.5-1 tablets (12.5-25 mg total) by mouth 3 (three) times daily as needed for up to 30 doses for dizziness.  [name] omeprazole (PRILOSEC) 20 MG capsule TAKE 1 CAPSULE (20 MG TOTAL) BY MOUTH DAILY.  [name] ondansetron (ZOFRAN) 4 MG tablet Take 1 tablet (4 mg total) by mouth every 6 (six) hours as needed for nausea.  . polyethylene glycol (MIRALAX / GLYCOLAX) packet Take 8.5 g by mouth daily as needed for moderate constipation. Mix 1/2 scoop (8.5 g) in 8 oz orange juice and drink daily   No facility-administered encounter medications on file as of 11/24/2019.    PHYSICAL EXAM:   General: frail appearing, thin, pale, lying supine on couch. Calling out intermittently her husband's name. Keeps her eyes closed. Somnolent. Patient's son and husband are in attendance. PE limited to reduce COVID exposure Skin: no rashes Neurological: Generalized weakness  Anselm Lis, NP

## 2019-11-25 ENCOUNTER — Encounter: Payer: Self-pay | Admitting: Internal Medicine

## 2019-11-28 ENCOUNTER — Encounter: Payer: Self-pay | Admitting: Family Medicine

## 2019-11-28 ENCOUNTER — Other Ambulatory Visit: Payer: Medicare HMO | Admitting: Internal Medicine

## 2019-11-28 ENCOUNTER — Telehealth: Payer: Self-pay | Admitting: Adult Health

## 2019-11-28 NOTE — Telephone Encounter (Signed)
Per Kandee Keen, fax in new order for imodium 2 mg to take twice daily until diarrhea is controlled.  Order faxed and received confirmation the transmission was successful.  Nothing further needed.

## 2019-11-28 NOTE — Telephone Encounter (Signed)
Gay Filler RN from [facility] needs to speak to [facility] about the pt having diarrhea. He stated that she has had black stools and recently hospitalized for colitis. He wants to make sure he can prescribed Imodium-ad?  Sam can be reached at [phone]  He needs a call ASAP

## 2019-11-28 NOTE — Telephone Encounter (Signed)
Can they do a hemoccult?

## 2019-11-28 NOTE — Telephone Encounter (Signed)
Ok to give immodium every 12 hours PRN for loose stools

## 2019-11-28 NOTE — Telephone Encounter (Signed)
Spoke to Sam.  Hemoccult is not an option.  Per Kandee Keen, he will call Art (husband) and speak with him before making any decisions.  Any orders can be faxed to [phone].

## 2019-12-01 ENCOUNTER — Telehealth: Payer: Self-pay | Admitting: Adult Health

## 2019-12-01 NOTE — Telephone Encounter (Signed)
Deborah [name], with [facility] is calling with an fyi- pt has developed some swallowing issues and she is calling a new order for Thick-It. Thanks

## 2019-12-02 ENCOUNTER — Telehealth: Payer: Self-pay | Admitting: Adult Health

## 2019-12-02 NOTE — Telephone Encounter (Signed)
Spoke to [place].  She was calling only to inform Kandee Keen that she started pt on Thick It Nectar.  Nothing further needed.

## 2019-12-02 NOTE — Telephone Encounter (Signed)
Ok for order for [employer]

## 2019-12-02 NOTE — Telephone Encounter (Signed)
Sharman Crate, RN [phone] is calling in to let you know that the pt is declining rapidly and steadly and black tarry stool and wanted to know if you wanted to stop or hold aspirin.  And Sharman Crate stated that you can text or call if needed.

## 2019-12-10 NOTE — Telephone Encounter (Signed)
Spoke to patients husband (Art) who informed me that Deborah [name] died on December 31, 2019. She had been transferred to [facility] and passed away comfortably.

## 2019-12-18 DEATH — deceased

## 2019-12-26 ENCOUNTER — Telehealth: Payer: Medicare HMO | Admitting: Cardiology

## 2020-01-08 ENCOUNTER — Telehealth: Payer: Medicare HMO | Admitting: Cardiology

## 2020-06-18 IMAGING — CT CT ANGIO CHEST-ABD-PELV FOR DISSECTION W/ AND WO/W CM
2 of 7 series · 11 of 36 positions shown, 15 images · IV contrast (OMNI 350)
Comparison: Radiograph earlier this day.

CLINICAL DATA: Chest pain or back pain, aortic dissection suspected

EXAM:
CT ANGIOGRAPHY CHEST, ABDOMEN AND PELVIS
TECHNIQUE: Multidetector CT imaging through the chest, abdomen and pelvis was
performed using the standard protocol during bolus administration of
intravenous contrast. Multiplanar reconstructed images and MIPs were
obtained and reviewed to evaluate the vascular anatomy.
CONTRAST:  80mL OMNIPAQUE IOHEXOL 350 MG/ML SOLN. 100 cc Omnipaque
350 IV extravasated in the left upper extremity, evaluated by non
interpreting radiologist.

[Series 10: dissection 2mm · axial · 0.77mm/px · z∈[-695,-177]mm · 10 of 297 slices shown, 13 images]
[im 19/297  mediastinal]
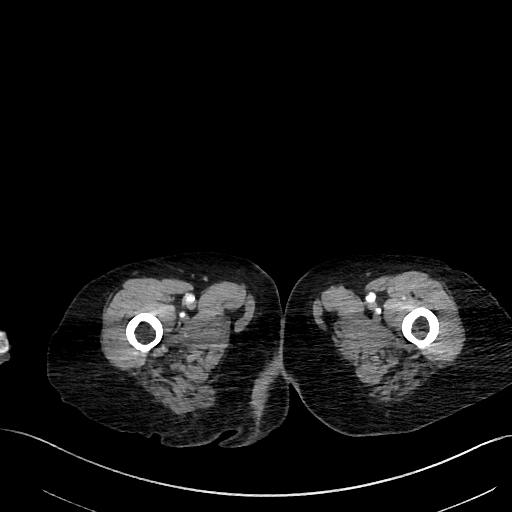
[im 19/297  bone]
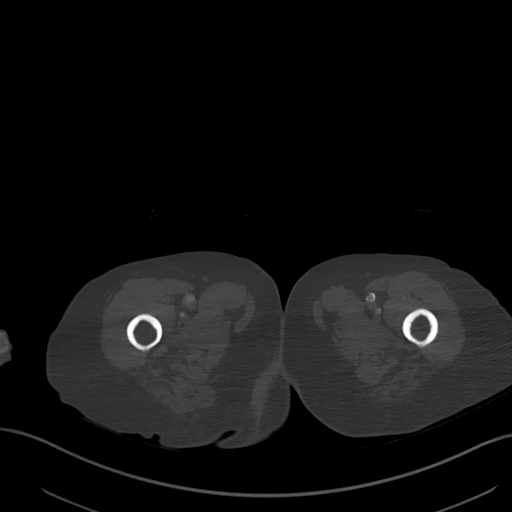
[im 56/297  mediastinal]
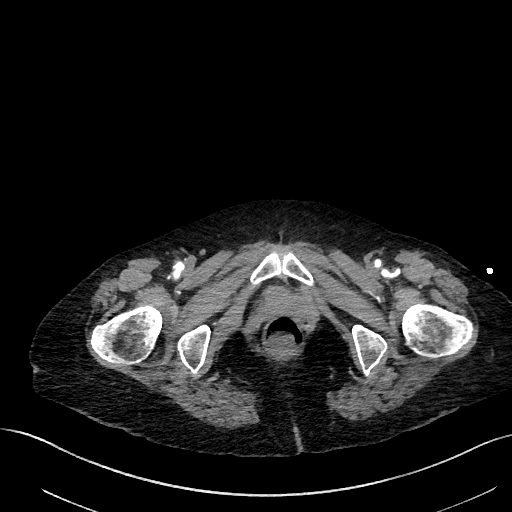
[im 93/297  mediastinal]
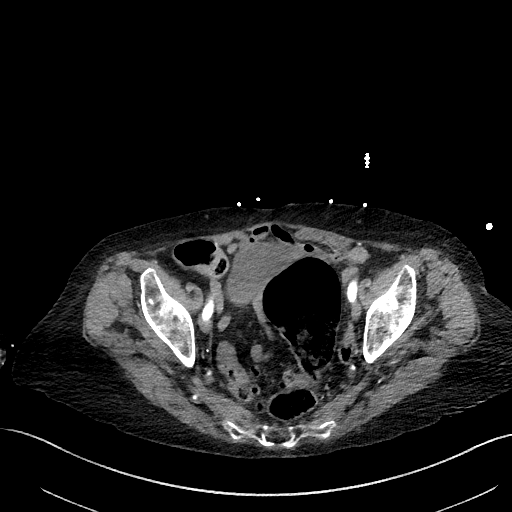
[im 130/297  mediastinal]
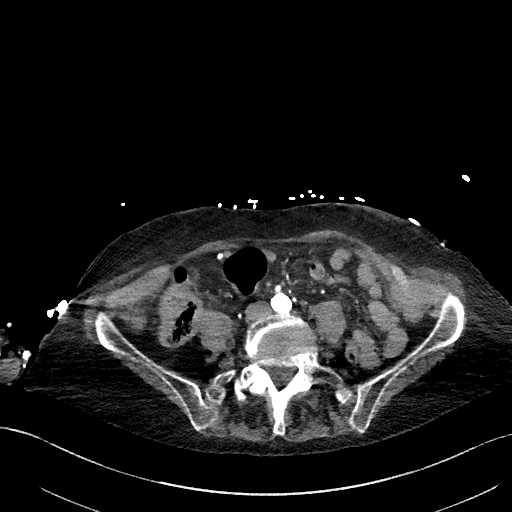
[im 167/297  mediastinal]
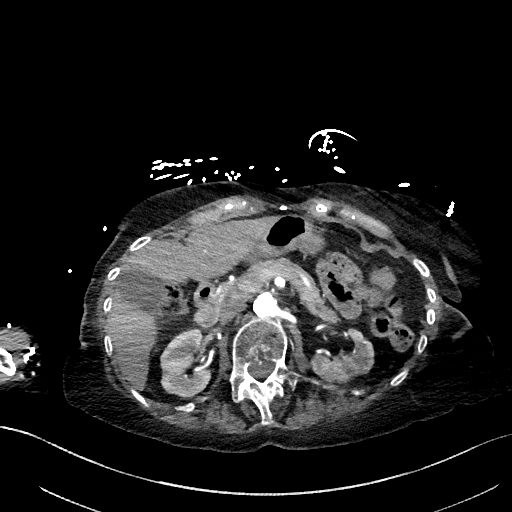
[im 204/297  mediastinal]
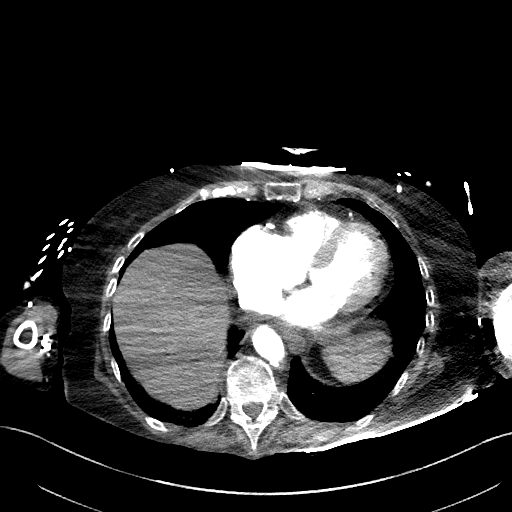
[im 223/297  lung]
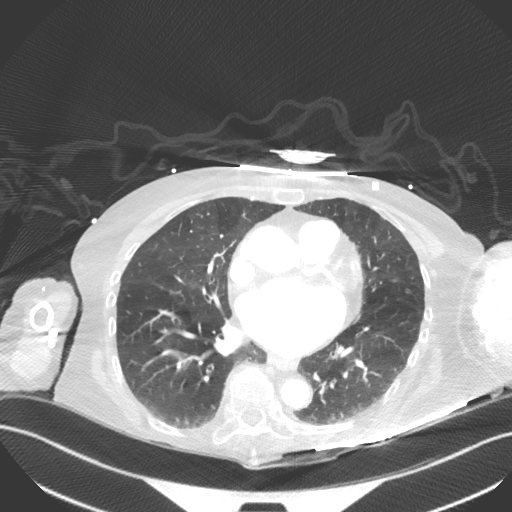
[im 241/297  mediastinal]
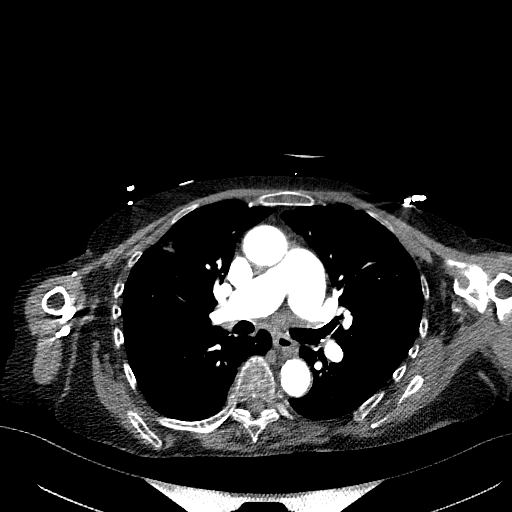
[im 241/297  lung]
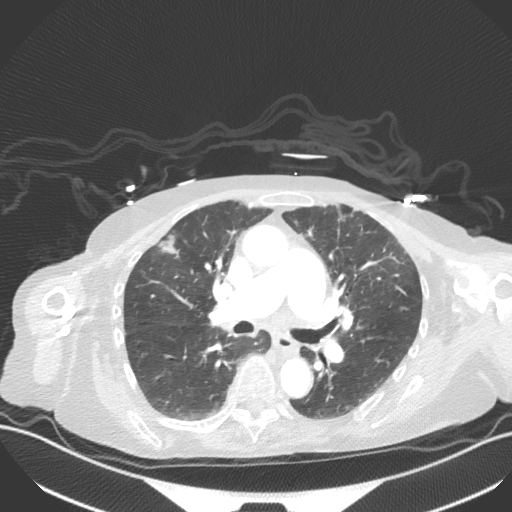
[im 260/297  lung]
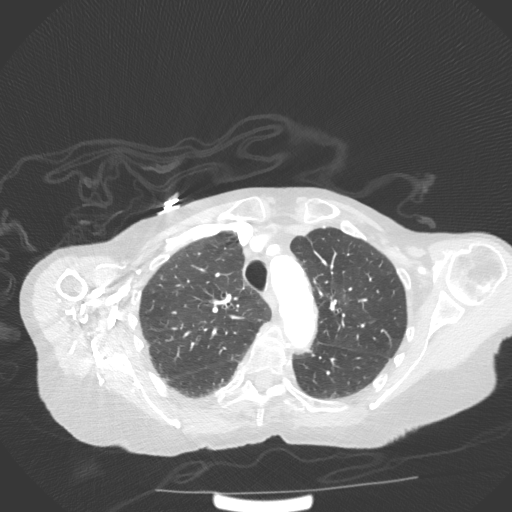
[im 278/297  mediastinal]
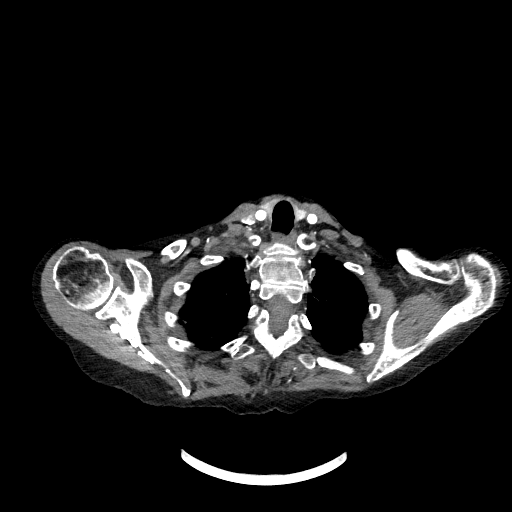
[im 278/297  lung]
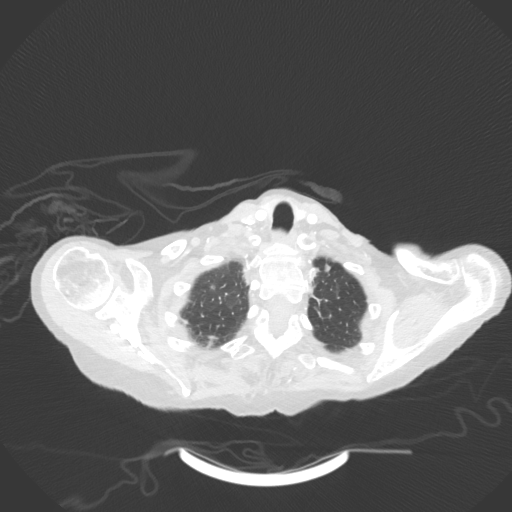

[Series 13: dissection 2mm cor · coronal · 0.76mm/px · 1 of 151 slices shown, 2 images]
[im 76/151  mediastinal]
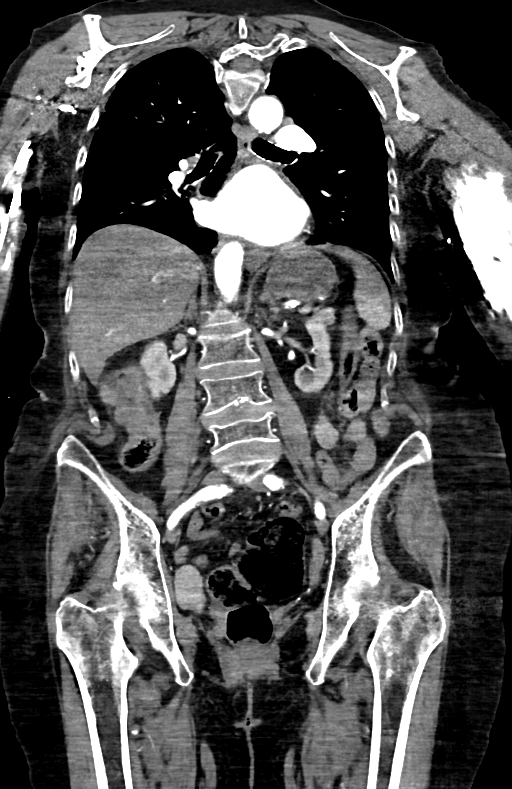
[im 76/151  bone]
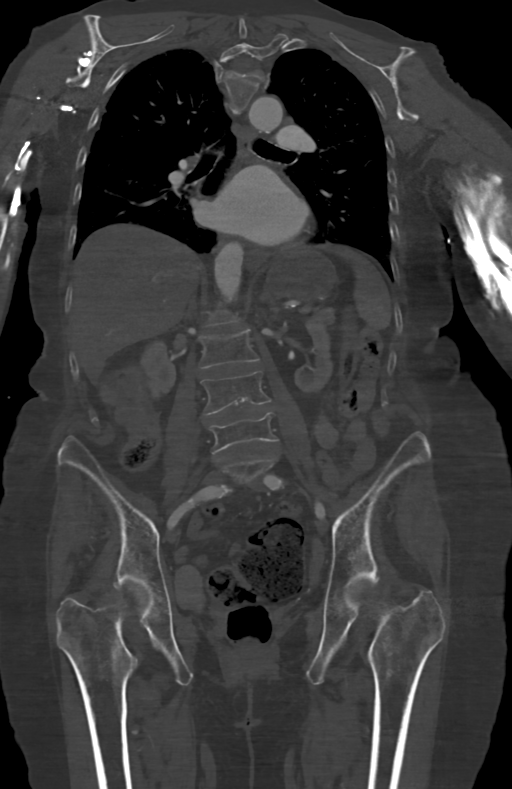

[11 of 36 positions shown; findings below may reference images not displayed]

FINDINGS: CTA CHEST FINDINGS

Cardiovascular: Thoracic aorta is normal in caliber without
dissection, aortic hematoma, or aneurysm. Moderate aortic
atherosclerosis. The left subclavian artery is occluded at its
origin, reconstituted approximately 2 cm distally. No pulmonary
embolus to the lobar level. Heart is normal in size. There are
coronary artery calcifications. No pericardial effusion.

Mediastinum/Nodes: Small mediastinal and hilar lymph nodes. No
pathologic adenopathy. No esophageal wall thickening. No visualized
thyroid nodule.

Lungs/Pleura: 14 mm serpiginous tubular structure in the anterior
right upper lobe, series 11 image 60, likely a small AVM. There is
an additional tubular serpiginous lesion in the right lower lobe
measuring approximately 2 cm, also likely a small AVM. No confluent
airspace disease. No evidence of pulmonary edema. No pleural fluid.

Musculoskeletal: Extravasated IV contrast within the musculature of
the left upper extremity. Severe compression fracture of T9 and T11,
technically age indeterminate but likely chronic. Remote sternal
fracture. Implanted loop recorder in the left chest wall. Remote
posterior right rib fracture.

Review of the MIP images confirms the above findings.

CTA ABDOMEN AND PELVIS FINDINGS

VASCULAR

Aorta: Moderate atherosclerosis. No dissection, aneurysm, or
evidence of vasculitis.

Celiac: Splenic and hepatic arteries with separate origins from the
aorta. Plaque at the origin of the common hepatic artery causes mild
stenosis with poststenotic dilatation.

SMA: Plaque at the origin but no significant stenosis. No dissection
or evidence of vasculitis. Distal branch vessels are patent.

Renals: Plaque at the origin of both renal arteries, left greater
than right. No dissection or aneurysm. No evidence of vasculitis.

IMA: Patent.

Inflow: Patent without aneurysm, dissection, vasculitis or severe
stenosis. Moderate atherosclerosis.

Veins: No obvious venous abnormality within the limitations of this
arterial phase study.

Review of the MIP images confirms the above findings.

NON-VASCULAR

Hepatobiliary: Minimal contrast refluxes into the hepatic veins and
IVC. No focal hepatic lesion. Gallbladder is unremarkable. No
biliary dilatation.

Pancreas: No ductal dilatation or inflammation.

Spleen: Normal in size and arterial enhancement.

Adrenals/Urinary Tract: Mild left adrenal thickening without
dominant nodule. Normal right adrenal gland. No hydronephrosis. No
perinephric edema. Mild cortical scarring in the upper left kidney.
Small bilateral renal cysts. Early excretion of IV contrast in both
renal collecting systems likely from extravasated contrast material.
Excreted IV contrast in the urinary bladder which is otherwise
unremarkable.

Stomach/Bowel: Stomach is nondistended. No small bowel wall
thickening, inflammation or obstruction. Equivocal mild wall
thickening of the ascending colon without pericolonic edema. Colonic
diverticulosis without evidence of diverticulitis.

Lymphatic: No adenopathy.

Reproductive: Status post hysterectomy. No adnexal masses.

Other: No free air, free fluid, or intra-abdominal fluid collection.

Musculoskeletal: Mild compression fracture of L1 and L2, moderate
compression fracture of L4. These are technically age indeterminate
but likely chronic. Bones are diffusely under mineralized.

Review of the MIP images confirms the above findings.
IMPRESSION: CTA CHEST

1. No aortic dissection or acute aortic abnormality. Moderate aortic
atherosclerosis.
2. Occlusion of the left subclavian artery at its origin,
reconstituted approximately 2 cm distally.
3. Probable small pulmonary AVM's in the right upper and right lower
lobes (1.4 cm in 2.0 cm respectively). No prior exams to evaluate
for comparison. Consider follow-up chest CT with contrast in 6
months to evaluate for stability.
4. Coronary artery calcifications.
5. Equivocal mild wall thickening of the ascending colon without
pericolonic edema. This may be secondary to colitis.
6. Compression fractures of T9, T11, L1, L2, and L4, technically age
indeterminate but likely chronic.
7. Colonic diverticulosis without diverticulitis.
8. IV contrast extravasation in the left upper extremity.

Aortic Atherosclerosis ([id number]).
